# Patient Record
Sex: Female | Born: 1945 | Race: White | Hispanic: No | Smoking: Never smoker
Health system: Southern US, Community
[De-identification: ages and names within clinical notes are randomized; demographics above are authoritative.]

## PROBLEM LIST (undated history)

## (undated) DIAGNOSIS — E782 Mixed hyperlipidemia: Secondary | ICD-10-CM

## (undated) DIAGNOSIS — K55039 Acute (reversible) ischemia of large intestine, extent unspecified: Secondary | ICD-10-CM

## (undated) DIAGNOSIS — R1010 Upper abdominal pain, unspecified: Secondary | ICD-10-CM

## (undated) DIAGNOSIS — E039 Hypothyroidism, unspecified: Secondary | ICD-10-CM

## (undated) DIAGNOSIS — K922 Gastrointestinal hemorrhage, unspecified: Secondary | ICD-10-CM

## (undated) DIAGNOSIS — I2510 Atherosclerotic heart disease of native coronary artery without angina pectoris: Secondary | ICD-10-CM

## (undated) DIAGNOSIS — I10 Essential (primary) hypertension: Secondary | ICD-10-CM

## (undated) DIAGNOSIS — F329 Major depressive disorder, single episode, unspecified: Secondary | ICD-10-CM

## (undated) DIAGNOSIS — F32A Depression, unspecified: Secondary | ICD-10-CM

## (undated) DIAGNOSIS — K219 Gastro-esophageal reflux disease without esophagitis: Secondary | ICD-10-CM

## (undated) DIAGNOSIS — F0390 Unspecified dementia without behavioral disturbance: Secondary | ICD-10-CM

## (undated) DIAGNOSIS — F419 Anxiety disorder, unspecified: Secondary | ICD-10-CM

## (undated) DIAGNOSIS — E785 Hyperlipidemia, unspecified: Secondary | ICD-10-CM

## (undated) HISTORY — DX: Essential (primary) hypertension: I10

## (undated) HISTORY — DX: Depression, unspecified: F32.A

## (undated) HISTORY — DX: Upper abdominal pain, unspecified: R10.10

## (undated) HISTORY — DX: Mixed hyperlipidemia: E78.2

## (undated) HISTORY — DX: Hypothyroidism, unspecified: E03.9

## (undated) HISTORY — DX: Gastro-esophageal reflux disease without esophagitis: K21.9

## (undated) HISTORY — DX: Hyperlipidemia, unspecified: E78.5

## (undated) HISTORY — DX: Acute (reversible) ischemia of large intestine, extent unspecified: K55.039

## (undated) HISTORY — DX: Anxiety disorder, unspecified: F41.9

## (undated) HISTORY — DX: Major depressive disorder, single episode, unspecified: F32.9

## (undated) HISTORY — DX: Gastrointestinal hemorrhage, unspecified: K92.2

## (undated) HISTORY — DX: Unspecified dementia, unspecified severity, without behavioral disturbance, psychotic disturbance, mood disturbance, and anxiety: F03.90

## (undated) HISTORY — DX: Atherosclerotic heart disease of native coronary artery without angina pectoris: I25.10

---

## 1964-08-20 HISTORY — PX: INNER EAR SURGERY: SHX679

## 1968-08-20 HISTORY — PX: TUBAL LIGATION: SHX77

## 1995-08-21 HISTORY — PX: BREAST BIOPSY: SHX20

## 1998-05-25 ENCOUNTER — Encounter: Payer: Self-pay | Admitting: Emergency Medicine

## 1998-05-25 ENCOUNTER — Inpatient Hospital Stay (HOSPITAL_COMMUNITY): Admission: EM | Admit: 1998-05-25 | Discharge: 1998-05-26 | Payer: Self-pay | Admitting: Emergency Medicine

## 1998-07-07 ENCOUNTER — Other Ambulatory Visit: Admission: RE | Admit: 1998-07-07 | Discharge: 1998-07-07 | Payer: Self-pay | Admitting: Obstetrics and Gynecology

## 2000-04-26 ENCOUNTER — Other Ambulatory Visit: Admission: RE | Admit: 2000-04-26 | Discharge: 2000-04-26 | Payer: Self-pay | Admitting: Obstetrics and Gynecology

## 2001-08-20 HISTORY — PX: COLONOSCOPY: SHX174

## 2002-01-26 ENCOUNTER — Ambulatory Visit (HOSPITAL_COMMUNITY): Admission: RE | Admit: 2002-01-26 | Discharge: 2002-01-26 | Payer: Self-pay | Admitting: Gastroenterology

## 2002-09-24 ENCOUNTER — Ambulatory Visit (HOSPITAL_COMMUNITY): Admission: RE | Admit: 2002-09-24 | Discharge: 2002-09-24 | Payer: Self-pay | Admitting: Family Medicine

## 2002-09-24 ENCOUNTER — Encounter: Payer: Self-pay | Admitting: Family Medicine

## 2003-10-19 ENCOUNTER — Ambulatory Visit (HOSPITAL_COMMUNITY): Admission: RE | Admit: 2003-10-19 | Discharge: 2003-10-19 | Payer: Self-pay | Admitting: Family Medicine

## 2004-02-02 ENCOUNTER — Emergency Department (HOSPITAL_COMMUNITY): Admission: EM | Admit: 2004-02-02 | Discharge: 2004-02-02 | Payer: Self-pay | Admitting: Emergency Medicine

## 2004-02-02 ENCOUNTER — Inpatient Hospital Stay (HOSPITAL_COMMUNITY): Admission: EM | Admit: 2004-02-02 | Discharge: 2004-02-05 | Payer: Self-pay | Admitting: Emergency Medicine

## 2004-02-18 ENCOUNTER — Inpatient Hospital Stay (HOSPITAL_COMMUNITY): Admission: AD | Admit: 2004-02-18 | Discharge: 2004-02-19 | Payer: Self-pay | Admitting: Internal Medicine

## 2004-03-03 ENCOUNTER — Ambulatory Visit (HOSPITAL_COMMUNITY): Admission: RE | Admit: 2004-03-03 | Discharge: 2004-03-03 | Payer: Self-pay | Admitting: Neurosurgery

## 2007-03-03 ENCOUNTER — Encounter: Payer: Self-pay | Admitting: Cardiology

## 2007-03-03 ENCOUNTER — Ambulatory Visit (HOSPITAL_COMMUNITY): Admission: RE | Admit: 2007-03-03 | Discharge: 2007-03-03 | Payer: Self-pay | Admitting: *Deleted

## 2007-05-29 ENCOUNTER — Ambulatory Visit (HOSPITAL_COMMUNITY): Admission: RE | Admit: 2007-05-29 | Discharge: 2007-05-29 | Payer: Self-pay | Admitting: Family Medicine

## 2008-06-15 ENCOUNTER — Ambulatory Visit (HOSPITAL_COMMUNITY): Admission: RE | Admit: 2008-06-15 | Discharge: 2008-06-15 | Payer: Self-pay | Admitting: Obstetrics and Gynecology

## 2008-06-28 ENCOUNTER — Other Ambulatory Visit: Admission: RE | Admit: 2008-06-28 | Discharge: 2008-06-28 | Payer: Self-pay | Admitting: Obstetrics & Gynecology

## 2008-09-06 ENCOUNTER — Inpatient Hospital Stay (HOSPITAL_COMMUNITY): Admission: EM | Admit: 2008-09-06 | Discharge: 2008-09-08 | Payer: Self-pay | Admitting: Internal Medicine

## 2008-09-06 ENCOUNTER — Encounter: Payer: Self-pay | Admitting: Emergency Medicine

## 2008-09-14 ENCOUNTER — Ambulatory Visit: Payer: Self-pay | Admitting: Cardiology

## 2008-11-10 ENCOUNTER — Encounter (INDEPENDENT_AMBULATORY_CARE_PROVIDER_SITE_OTHER): Payer: Self-pay | Admitting: *Deleted

## 2008-11-10 LAB — CONVERTED CEMR LAB
ALT: 29 units/L
AST: 12 units/L
Albumin: 4.5 g/dL
Alkaline Phosphatase: 94 units/L
BUN: 10 mg/dL
CO2: 27 meq/L
Calcium: 9.9 mg/dL
Chloride: 105 meq/L
Cholesterol: 176 mg/dL
Creatinine, Ser: 0.79 mg/dL
Glucose, Bld: 118 mg/dL
HDL: 48 mg/dL
LDL Cholesterol: 90 mg/dL
Potassium: 4.9 meq/L
Sodium: 145 meq/L
Total Protein: 7.6 g/dL
Triglycerides: 189 mg/dL

## 2008-11-30 ENCOUNTER — Ambulatory Visit: Payer: Self-pay | Admitting: Cardiology

## 2009-05-20 HISTORY — PX: TOTAL ABDOMINAL HYSTERECTOMY W/ BILATERAL SALPINGOOPHORECTOMY: SHX83

## 2009-06-17 ENCOUNTER — Ambulatory Visit (HOSPITAL_COMMUNITY): Admission: RE | Admit: 2009-06-17 | Discharge: 2009-06-17 | Payer: Self-pay | Admitting: Obstetrics and Gynecology

## 2009-06-20 ENCOUNTER — Ambulatory Visit (HOSPITAL_COMMUNITY): Admission: RE | Admit: 2009-06-20 | Discharge: 2009-06-20 | Payer: Self-pay | Admitting: Obstetrics and Gynecology

## 2009-06-21 ENCOUNTER — Inpatient Hospital Stay (HOSPITAL_COMMUNITY): Admission: RE | Admit: 2009-06-21 | Discharge: 2009-06-23 | Payer: Self-pay | Admitting: Obstetrics and Gynecology

## 2009-06-21 ENCOUNTER — Encounter: Payer: Self-pay | Admitting: Obstetrics and Gynecology

## 2009-06-28 ENCOUNTER — Encounter (INDEPENDENT_AMBULATORY_CARE_PROVIDER_SITE_OTHER): Payer: Self-pay | Admitting: *Deleted

## 2009-06-28 ENCOUNTER — Ambulatory Visit: Payer: Self-pay | Admitting: Cardiology

## 2009-06-28 DIAGNOSIS — K219 Gastro-esophageal reflux disease without esophagitis: Secondary | ICD-10-CM | POA: Insufficient documentation

## 2009-06-28 DIAGNOSIS — I2510 Atherosclerotic heart disease of native coronary artery without angina pectoris: Secondary | ICD-10-CM | POA: Insufficient documentation

## 2009-07-04 ENCOUNTER — Encounter (INDEPENDENT_AMBULATORY_CARE_PROVIDER_SITE_OTHER): Payer: Self-pay | Admitting: *Deleted

## 2009-07-04 ENCOUNTER — Encounter: Payer: Self-pay | Admitting: Cardiology

## 2009-07-04 LAB — CONVERTED CEMR LAB
ALT: 38 units/L
ALT: 38 units/L — ABNORMAL HIGH (ref 0–35)
AST: 19 units/L
AST: 19 units/L (ref 0–37)
Albumin: 4.5 g/dL
Albumin: 4.5 g/dL (ref 3.5–5.2)
Alkaline Phosphatase: 86 units/L
Alkaline Phosphatase: 86 units/L (ref 39–117)
BUN: 8 mg/dL
BUN: 8 mg/dL (ref 6–23)
CO2: 25 meq/L
CO2: 25 meq/L (ref 19–32)
Calcium: 9.9 mg/dL
Calcium: 9.9 mg/dL (ref 8.4–10.5)
Chloride: 103 meq/L
Chloride: 103 meq/L (ref 96–112)
Cholesterol: 159 mg/dL
Cholesterol: 159 mg/dL (ref 0–200)
Creatinine, Ser: 0.87 mg/dL
Creatinine, Ser: 0.87 mg/dL (ref 0.40–1.20)
Glucose, Bld: 117 mg/dL
Glucose, Bld: 117 mg/dL — ABNORMAL HIGH (ref 70–99)
HDL: 46 mg/dL
HDL: 46 mg/dL (ref >39)
LDL Cholesterol: 89 mg/dL
LDL Cholesterol: 89 mg/dL (ref 0–99)
Potassium: 5.1 meq/L
Potassium: 5.1 meq/L (ref 3.5–5.3)
Sodium: 142 meq/L
Sodium: 142 meq/L (ref 135–145)
Total Bilirubin: 0.5 mg/dL (ref 0.3–1.2)
Total CHOL/HDL Ratio: 3.5
Total Protein: 7.4 g/dL
Total Protein: 7.4 g/dL (ref 6.0–8.3)
Triglycerides: 119 mg/dL
Triglycerides: 119 mg/dL (ref <150)
VLDL: 24 mg/dL (ref 0–40)

## 2009-09-26 ENCOUNTER — Telehealth: Payer: Self-pay | Admitting: Cardiology

## 2009-10-26 ENCOUNTER — Telehealth (INDEPENDENT_AMBULATORY_CARE_PROVIDER_SITE_OTHER): Payer: Self-pay

## 2009-10-26 ENCOUNTER — Ambulatory Visit: Payer: Self-pay | Admitting: Cardiology

## 2010-03-14 ENCOUNTER — Encounter (INDEPENDENT_AMBULATORY_CARE_PROVIDER_SITE_OTHER): Payer: Self-pay | Admitting: *Deleted

## 2010-03-15 ENCOUNTER — Ambulatory Visit: Payer: Self-pay | Admitting: Cardiology

## 2010-03-15 ENCOUNTER — Encounter (INDEPENDENT_AMBULATORY_CARE_PROVIDER_SITE_OTHER): Payer: Self-pay | Admitting: *Deleted

## 2010-04-17 ENCOUNTER — Encounter (INDEPENDENT_AMBULATORY_CARE_PROVIDER_SITE_OTHER): Payer: Self-pay | Admitting: *Deleted

## 2010-04-17 LAB — CONVERTED CEMR LAB
ALT: 16 units/L
AST: 9 units/L
Albumin: 4.2 g/dL
Alkaline Phosphatase: 67 units/L
BUN: 11 mg/dL
CO2: 29 meq/L
Calcium: 9.5 mg/dL
Chloride: 105 meq/L
Cholesterol: 214 mg/dL
Creatinine, Ser: 0.66 mg/dL
Glucose, Bld: 108 mg/dL
HDL: 50 mg/dL
LDL Cholesterol: 137 mg/dL
Potassium: 4.9 meq/L
Sodium: 141 meq/L
Total Protein: 6.9 g/dL
Triglycerides: 134 mg/dL

## 2010-04-18 ENCOUNTER — Encounter (INDEPENDENT_AMBULATORY_CARE_PROVIDER_SITE_OTHER): Payer: Self-pay | Admitting: *Deleted

## 2010-05-04 ENCOUNTER — Encounter (INDEPENDENT_AMBULATORY_CARE_PROVIDER_SITE_OTHER): Payer: Self-pay | Admitting: *Deleted

## 2010-05-04 LAB — CONVERTED CEMR LAB
ALT: 16 units/L (ref 0–35)
AST: 9 units/L (ref 0–37)
Albumin: 4.2 g/dL (ref 3.5–5.2)
Alkaline Phosphatase: 67 units/L (ref 39–117)
BUN: 11 mg/dL (ref 6–23)
CO2: 29 meq/L (ref 19–32)
Calcium: 9.5 mg/dL (ref 8.4–10.5)
Chloride: 105 meq/L (ref 96–112)
Cholesterol: 214 mg/dL — ABNORMAL HIGH (ref 0–200)
Creatinine, Ser: 0.66 mg/dL (ref 0.40–1.20)
Glucose, Bld: 108 mg/dL — ABNORMAL HIGH (ref 70–99)
HDL: 50 mg/dL (ref >39)
LDL Cholesterol: 137 mg/dL — ABNORMAL HIGH (ref 0–99)
Potassium: 4.9 meq/L (ref 3.5–5.3)
Sodium: 141 meq/L (ref 135–145)
Total Bilirubin: 0.4 mg/dL (ref 0.3–1.2)
Total CHOL/HDL Ratio: 4.3
Total Protein: 6.9 g/dL (ref 6.0–8.3)
Triglycerides: 134 mg/dL (ref <150)
VLDL: 27 mg/dL (ref 0–40)

## 2010-05-15 ENCOUNTER — Telehealth (INDEPENDENT_AMBULATORY_CARE_PROVIDER_SITE_OTHER): Payer: Self-pay | Admitting: *Deleted

## 2010-06-13 LAB — CONVERTED CEMR LAB
Cholesterol: 196 mg/dL (ref 0–200)
HDL: 57 mg/dL (ref >39)
LDL Cholesterol: 119 mg/dL — ABNORMAL HIGH (ref 0–99)
Total CHOL/HDL Ratio: 3.4
Triglycerides: 98 mg/dL (ref <150)
VLDL: 20 mg/dL (ref 0–40)

## 2010-06-23 ENCOUNTER — Telehealth (INDEPENDENT_AMBULATORY_CARE_PROVIDER_SITE_OTHER): Payer: Self-pay | Admitting: *Deleted

## 2010-06-26 ENCOUNTER — Encounter (INDEPENDENT_AMBULATORY_CARE_PROVIDER_SITE_OTHER): Payer: Self-pay | Admitting: *Deleted

## 2010-09-19 NOTE — Miscellaneous (Signed)
Summary: cmp,lipids 11/10/2008  Clinical Lists Changes  Observations: Added new observation of CALCIUM: 9.9 mg/dL (40/34/7425 95:63) Added new observation of ALBUMIN: 4.5 g/dL (87/56/4332 95:18) Added new observation of PROTEIN, TOT: 7.6 g/dL (84/16/6063 01:60) Added new observation of SGPT (ALT): 29 units/L (11/10/2008 10:30) Added new observation of SGOT (AST): 12 units/L (11/10/2008 10:30) Added new observation of ALK PHOS: 94 units/L (11/10/2008 10:30) Added new observation of BILI DIRECT: total bili   0.6 mg/dL (10/93/2355 73:22) Added new observation of CREATININE: 0.79 mg/dL (02/54/2706 23:76) Added new observation of BUN: 10 mg/dL (28/31/5176 16:07) Added new observation of BG RANDOM: 118 mg/dL (37/05/6268 48:54) Added new observation of CO2 PLSM/SER: 27 meq/L (11/10/2008 10:30) Added new observation of CL SERUM: 105 meq/L (11/10/2008 10:30) Added new observation of K SERUM: 4.9 meq/L (11/10/2008 10:30) Added new observation of NA: 145 meq/L (11/10/2008 10:30) Added new observation of LDL: 90 mg/dL (62/70/3500 93:81) Added new observation of HDL: 48 mg/dL (82/99/3716 96:78) Added new observation of TRIGLYC TOT: 189 mg/dL (93/81/0175 10:25) Added new observation of CHOLESTEROL: 176 mg/dL (85/27/7824 23:53)

## 2010-09-19 NOTE — Assessment & Plan Note (Signed)
Summary: 7 MTH F/UPER CHECKOUT ON 11/30/08/TG  Medications Added FISH OIL 1000 MG CAPS (OMEGA-3 FATTY ACIDS) take 1 tab daily ASPIR-LOW 81 MG TBEC (ASPIRIN) take 1 tab daily LIPITOR 40 MG TABS (ATORVASTATIN CALCIUM) take 1 tab daily TOPROL XL 50 MG XR24H-TAB (METOPROLOL SUCCINATE) take 1 tab daily DIOVAN HCT 320-25 MG TABS (VALSARTAN-HYDROCHLOROTHIAZIDE) take 1 tab daily PROTONIX 40 MG TBEC (PANTOPRAZOLE SODIUM) take 1 tab daily OXYCODONE-ACETAMINOPHEN 2.5-325 MG TABS (OXYCODONE-ACETAMINOPHEN) take as needed for pain STOOL SOFTENER 100 MG CAPS (DOCUSATE SODIUM) take as needed      Allergies Added: NKDA  Visit Type:  Follow-up Primary Provider:  Dr. [name]   History of Present Illness:   Return visit for this very pleasant 65 year old woman with minor coronary artery disease when last assessed in 2008, intermittent chest discomfort, dyslipidemia and hypertension.  Since her last visit, she has done generally well.  She reports rare episodes of chest discomfort that responded to sublingual nitroglycerin.  These are poorly characterized, fairly mild, located in the anterior chest diffusely and unassociated with other symptoms.  She has adequate exercise tolerance and does her housework without difficulty.  Clinical Review Panels:  Lab Work BUN 10 (11/10/2008) Creatinine 0.79 (11/10/2008)  Lipid Levels LDL 90 (11/10/2008) HDL 48 (11/10/2008) Cholesterol 176 (11/10/2008)    Current Medications (verified): 1)  Fish Oil 1000 Mg Caps (Omega-3 Fatty Acids) .... Take 1 Tab Daily 2)  Aspir-Low 81 Mg Tbec (Aspirin) .... Take 1 Tab Daily 3)  Lipitor 40 Mg Tabs (Atorvastatin Calcium) .... Take 1 Tab Daily 4)  Toprol Xl 50 Mg Xr24h-Tab (Metoprolol Succinate) .... Take 1 Tab Daily 5)  Diovan Hct 320-25 Mg Tabs (Valsartan-Hydrochlorothiazide) .... Take 1 Tab Daily 6)  Protonix 40 Mg Tbec (Pantoprazole Sodium) .... Take 1 Tab Daily 7)  Oxycodone-Acetaminophen 2.5-325 Mg Tabs  (Oxycodone-Acetaminophen) .... Take As Needed For Pain 8)  Stool Softener 100 Mg Caps (Docusate Sodium) .... Take As Needed  Allergies (verified): No Known Drug Allergies  Past History:  PMH, FH, and Social History reviewed and updated.  Past Surgical History: Right ear surgical procedure-1966 Excisional left breast biopsy-1997 Tubal ligation-1970 Total abdominal hysterectomy with bilateral salpingo-oophorectomy-05/2009  Review of Systems       The patient complains of chest pain.  The patient denies weight loss, syncope, dyspnea on exertion, and peripheral edema.    Vital Signs:  Patient profile:   65 year old female Height:      65 inches Weight:      163 pounds BMI:     27.22 Pulse rate:   85 / minute BP sitting:   137 / 84  (right arm)  Vitals Entered By: Dreama Saa, CNA (June 28, 2009 1:48 PM)  Physical Exam  General:   General-Well developed; no acute distress:mildly overweight Neck-No JVD; no carotid bruits: Lungs-No tachypnea, no rales; no rhonchi; no wheezes: Cardiovascular-normal PMI; normal S1 and S2: Abdomen-BS normal; soft and non-tender without masses or organomegaly: no bruits Musculoskeletal-No deformities, no cyanosis or clubbing: Neurologic-Normal cranial nerves; symmetric strength and tone:  Skin-Warm, no significant lesions: Extremities-Nl distal pulses; no edema:     Impression & Recommendations:  Problem # 1:  ATHEROSCLEROTIC CARDIOVASCULAR DISEASE (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  In any case, she had low risk disease only 2 years ago.  No further testing is required at the present time.  Should chest discomfort become more frequent or troublesome, I  would be inclined to add amlodipine to her medical regime.  Problem # 2:  HYPERTENSION, UNSPECIFIED (ICD-401.9) Blood pressure control is good with current medications, which will be continued.  Problem  # 3:  HYPERLIPIDEMIA-MIXED (ICD-272.4) Control of hyperlipidemia was almost optimal on 20 mg of atorvastatin when last assessed a few months ago.  We will repeat a lipid profile and a chemistry profile.  I will plan to see this nice woman again in 8 months.  She is cautioned to call for any increase in symptoms in the interim.  Other Orders: Future Orders: T-Lipid Profile ([id number]) ... 07/04/2009 T-Comprehensive Metabolic Panel [phone]) ... 07/04/2009  Patient Instructions: 1)  Your physician recommends that you schedule a follow-up appointment in: 8 MONTHS 2)  Your physician recommends that you return for lab work in: [employer]

## 2010-09-19 NOTE — Miscellaneous (Signed)
Summary: LABS CMP,LIPIDS,04/17/2010  Clinical Lists Changes  Observations: Added new observation of CALCIUM: 9.5 mg/dL (16/05/9603 5:40) Added new observation of ALBUMIN: 4.2 g/dL (98/06/9146 8:29) Added new observation of PROTEIN, TOT: 6.9 g/dL (56/21/3086 5:78) Added new observation of SGPT (ALT): 16 units/L (04/17/2010 9:27) Added new observation of SGOT (AST): 9 units/L (04/17/2010 9:27) Added new observation of ALK PHOS: 67 units/L (04/17/2010 9:27) Added new observation of CREATININE: 0.66 mg/dL (46/96/2952 8:41) Added new observation of BUN: 11 mg/dL (32/44/0102 7:25) Added new observation of BG RANDOM: 108 mg/dL (36/64/4034 7:42) Added new observation of CO2 PLSM/SER: 29 meq/L (04/17/2010 9:27) Added new observation of CL SERUM: 105 meq/L (04/17/2010 9:27) Added new observation of K SERUM: 4.9 meq/L (04/17/2010 9:27) Added new observation of NA: 141 meq/L (04/17/2010 9:27) Added new observation of LDL: 137 mg/dL (59/56/3875 6:43) Added new observation of HDL: 50 mg/dL (32/95/1884 1:66) Added new observation of TRIGLYC TOT: 134 mg/dL (02/17/1600 0:93) Added new observation of CHOLESTEROL: 214 mg/dL (23/55/7322 0:25)

## 2010-09-19 NOTE — Progress Notes (Signed)
Summary: Medication   Phone Note Call from Patient   Caller: Patient Summary of Call: pt would return phone call regarding medication that was called in this AM/tg Initial call taken by: Raechel Ache [facility],  October 26, 2009 1:51 PM  Follow-up for Phone Call        Patient wanted me to know that she uses [facility] in [place] as her pharmacy and not [facility] any longer. Follow-up by: Larita Fife Via LPN,  October 26, 2009 3:36 PM

## 2010-09-19 NOTE — Letter (Signed)
Summary: BP READINGS  BP READINGS   Imported By: Faythe Ghee 10/26/2009 13:41:57  _____________________________________________________________________  External Attachment:    Type:   Image     Comment:   External Document

## 2010-09-19 NOTE — Assessment & Plan Note (Signed)
Summary: 8 mth f/u per checkout on 06/28/09/tg  Medications Added RANITIDINE HCL 150 MG CAPS (RANITIDINE HCL) take as needed PRAVASTATIN SODIUM 20 MG TABS (PRAVASTATIN SODIUM) Take 1 tablet by mouth once a day      Allergies Added: NKDA  Visit Type:  Follow-up Primary Provider:  Sabino Snipes   History of Present Illness: [name] returns to the office as scheduled for continued assessment and treatment and of mild coronary artery disease and control of cardiovascular risk factors.  Since her last visit, she has done beautifully.  In fact, she has had no medical attention whatsoever since I last saw her.  She denies chest discomfort, dyspnea, orthopnea, PND, lightheadedness, syncope and peripheral edema.  She has problems with left leg pain, which could represent arthritis at the hip or degenerative disease in the lumbosacral spine.  This was exacerbated by simvastatin causing her to discontinue that medication.  Unfortunately, she did not inform us of this problem.  Current Medications (verified): 1)  Fish Oil 1000 Mg Caps (Omega-3 Fatty Acids) .... Take 1 Tab Daily 2)  Aspir-Low 81 Mg Tbec (Aspirin) .... Take 1 Tab Daily 3)  Toprol Xl 50 Mg Xr24h-Tab (Metoprolol Succinate) .... Take 1 Tab Daily 4)  Stool Softener 100 Mg Caps (Docusate Sodium) .... Take As Needed 5)  Lisinopril-Hydrochlorothiazide 20-12.5 Mg Tabs (Lisinopril-Hydrochlorothiazide) .... Take 1 Tablet By Mouth Once Daily 6)  Ranitidine Hcl 150 Mg Caps (Ranitidine Hcl) .... Take As Needed 7)  Pravastatin Sodium 20 Mg Tabs (Pravastatin Sodium) .... Take 1 Tablet By Mouth Once A Day  Allergies (verified): No Known Drug Allergies  Past History:  PMH, FH, and Social History reviewed and updated.  Review of Systems       See history of present illness.  Vital Signs:  Patient profile:   65 year old female Weight:      160 pounds Pulse rate:   63 / minute BP sitting:   137 / 75  (right arm)  Vitals Entered By:  Dreama Saa, CNA (March 15, 2010 2:58 PM)  Physical Exam  General:  Minimally overweight; well developed; no acute distress Neck-No JVD; no carotid bruits: Lungs-No tachypnea, no rales; no rhonchi; no wheezes: Cardiovascular-normal PMI; normal S1 and S2; S4 present; minimal systolic murmur Abdomen-BS normal; soft and non-tender without masses or organomegaly: no bruits Musculoskeletal-No deformities, no cyanosis or clubbing: Neurologic-Normal cranial nerves; symmetric strength and tone:  Skin-Warm, no significant lesions: Extremities-Nl distal pulses; no edema:     Impression & Recommendations:  Problem # 1:  ATHEROSCLEROTIC CARDIOVASCULAR DISEASE (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  With nonobstructive disease at catheterization in 2008, we will continue to focus on management of cardiovascular risk factors.  Problem # 2:  HYPERTENSION (ICD-401.9) Patient provides [place] with blood pressure measurements during the month of March, which were superb.  Systolic rarely exceeded 125, and diastolics were consistently below 85.  Current therapy will be continued.  Problem # 3:  HYPERLIPIDEMIA (ICD-272.4) Pravastatin will be started at low dose.  This is generally one of the better tolerated statins.  If she experiences adverse effects, we will attempt to obtain a supply of atorvastatin for her from the manufacturer.  A lipid profile and chemistry profile will be obtained in one month.  I will plan to see this nice woman again in one year.  Other Orders: Future Orders: T-Lipid Profile ([id number]) ... 04/17/2010 T-Comprehensive Metabolic Panel [id number]) ... 04/17/2010  Patient Instructions: 1)  Your physician recommends that you schedule a follow-up appointment in: 1 year 2)  Your physician recommends that you return for lab work in: 1 month 3)  Your physician has recommended you make the following change in your medication: pravastatin 20mg   daily Prescriptions: PRAVASTATIN SODIUM 20 MG TABS (PRAVASTATIN SODIUM) Take 1 tablet by mouth once a day  #30 x 3   Entered by:   Teressa Lower RN   Authorized by:   Kathlen Brunswick, MD, [facility]   Signed by:   Teressa Lower RN on 03/15/2010   Method used:   Electronically to        [facility]  [address] (retail)       [address]       [place], [place]  [address]       Ph: [phone]       Fax: [phone]   RxID:   [id number]

## 2010-09-19 NOTE — Letter (Signed)
Summary: [place] [facility] Work Engineer, agricultural at [employer]  [address] [place] [address]   Phone: [phone]  Fax: [phone]     May 04, 2010 MRN: [id number]   [facility] [place] [address] [place], [place]  [address]      YOUR LAB WORK IS DUE   OCTOBER [phone]  Please go to [facility], located across the street from [facility] on the second floor.  Hours are Monday - Friday 7am until 7:30pm         Saturday 8am until 12noon    _X_  DO NOT EAT OR DRINK AFTER MIDNIGHT EVENING PRIOR TO LABWORK  __ YOUR LABWORK IS NOT FASTING --YOU MAY EAT PRIOR TO LABWORK

## 2010-09-19 NOTE — Progress Notes (Signed)
Summary: Medication Question  Medications Added SIMVASTATIN 40 MG TABS (SIMVASTATIN) Take 1 tablet by mouth at bedtime LISINOPRIL-HYDROCHLOROTHIAZIDE 20-12.5 MG TABS (LISINOPRIL-HYDROCHLOROTHIAZIDE) take 1 tablet by mouth once daily       Phone Note Call from Patient   Caller: Patient Reason for Call: Refill Medication Summary of Call: pt taking Diovan HCT 320/25 and Lipitor/wants to know if there is anything cheaper she can have due to insurance change/tg Initial call taken by: Raechel Ache [facility],  September 26, 2009 9:56 AM  Follow-up for Phone Call        pt's lipitor was name brand will change to simvastatin but she needs something in place of the diovan, no longer has ins. with copay of $10.00 for meds Follow-up by: Teressa Lower RN,  September 26, 2009 12:08 PM  Additional Follow-up for Phone Call Additional follow up Details #1::        Lisinopril/HCT 20/12.5 mg q.d. Home blood pressures Blood pressure check in one month  Oilton Bing, M.D.  Additional Follow-up by: Kathlen Brunswick, MD, [facility],  September 27, 2009 9:28 AM    Additional Follow-up for Phone Call Additional follow up Details #2::    Patient advised, she gave verbal understanding. I transferred call to front desk to set up appt. for BP check.  New/Updated Medications: SIMVASTATIN 40 MG TABS (SIMVASTATIN) Take 1 tablet by mouth at bedtime LISINOPRIL-HYDROCHLOROTHIAZIDE 20-12.5 MG TABS (LISINOPRIL-HYDROCHLOROTHIAZIDE) take 1 tablet by mouth once daily Prescriptions: LISINOPRIL-HYDROCHLOROTHIAZIDE 20-12.5 MG TABS (LISINOPRIL-HYDROCHLOROTHIAZIDE) take 1 tablet by mouth once daily  #30 x 6   Entered by:   Larita Fife Via LPN   Authorized by:   Kathlen Brunswick, MD, [facility]   Signed by:   Larita Fife Via LPN on 58/52/7782   Method used:   Electronically to        [facility]  [address] (retail)       [address]       [place], [place]  [address]       Ph: [phone]       Fax: [phone]   RxID:    [id number] SIMVASTATIN 40 MG TABS (SIMVASTATIN) Take 1 tablet by mouth at bedtime  #30 x 6   Entered by:   Larita Fife Via LPN   Authorized by:   Kathlen Brunswick, MD, [facility]   Signed by:   Larita Fife Via LPN on 50/53/9767   Method used:   Electronically to        [facility]  [address] (retail)       [address]       [place], [place]  [address]       Ph: [phone]       Fax: [phone]   RxID:   [id number]

## 2010-09-19 NOTE — Progress Notes (Signed)
Summary: Medication   Phone Note Call from Patient   Caller: Daughter [name]) Reason for Call: Talk to Nurse Summary of Call: pt states that pt has medicare so she cannot use $4 card for Lipitor / pls call with instructions /tg Initial call taken by: Raechel Ache [facility],  June 23, 2010 1:25 PM  Follow-up for Phone Call        pt to continue pravastatin 80mg  daily,  sent copy of labs to pt in the mail Follow-up by: Teressa Lower RN,  June 26, 2010 1:01 PM

## 2010-09-19 NOTE — Miscellaneous (Signed)
Summary: LABS CMP,LIPIDS,07/04/2009  Clinical Lists Changes  Observations: Added new observation of CALCIUM: 9.9 mg/dL (44/10/4740 59:56) Added new observation of ALBUMIN: 4.5 g/dL (38/75/6433 29:51) Added new observation of PROTEIN, TOT: 7.4 g/dL (88/41/6606 30:16) Added new observation of SGPT (ALT): 38 units/L (07/04/2009 17:15) Added new observation of SGOT (AST): 19 units/L (07/04/2009 17:15) Added new observation of ALK PHOS: 86 units/L (07/04/2009 17:15) Added new observation of CREATININE: 0.87 mg/dL (08/28/3233 57:32) Added new observation of BUN: 8 mg/dL (20/25/4270 62:37) Added new observation of BG RANDOM: 117 mg/dL (62/83/1517 61:60) Added new observation of CO2 PLSM/SER: 25 meq/L (07/04/2009 17:15) Added new observation of CL SERUM: 103 meq/L (07/04/2009 17:15) Added new observation of K SERUM: 5.1 meq/L (07/04/2009 17:15) Added new observation of NA: 142 meq/L (07/04/2009 17:15) Added new observation of LDL: 89 mg/dL (73/71/0626 94:85) Added new observation of HDL: 46 mg/dL (46/27/0350 09:38) Added new observation of TRIGLYC TOT: 119 mg/dL (18/29/9371 69:67) Added new observation of CHOLESTEROL: 159 mg/dL (89/38/1017 51:02)

## 2010-09-19 NOTE — Letter (Signed)
Summary: [facility] Work Engineer, agricultural at [employer]  [address] [place] [address]   Phone: [phone]  Fax: [phone]     March 15, 2010 MRN: [id number]   Lori Bell [address] [place], [place]  [address]      YOUR LAB WORK IS DUE   April 17, 2010  Please go to [facility], located across the street from [facility] on the second floor.  Hours are Monday - Friday 7am until 7:30pm         Saturday 8am until 12noon    _X_  DO NOT EAT OR DRINK AFTER MIDNIGHT EVENING PRIOR TO LABWORK  __ YOUR LABWORK IS NOT FASTING --YOU MAY EAT PRIOR TO LABWORK

## 2010-09-19 NOTE — Letter (Signed)
Summary: [name] Work Engineer, agricultural at [employer]  [address], [place] [address]   Phone: [phone]  Fax: [phone]     June 28, 2009 MRN: [id number]   Lori Bell [address] [place], [place]  [address]      YOUR LAB WORK IS DUE  NOVEMBER 15,2010 _________________________________________  Please go to [facility], located across the street from [facility] on the second floor.  Hours are Monday - Friday 7am until 7:30pm         Saturday 8am until 12noon    _X_  DO NOT EAT OR DRINK AFTER MIDNIGHT EVENING PRIOR TO LABWORK  __ YOUR LABWORK IS NOT FASTING --YOU MAY EAT PRIOR TO LABWORK

## 2010-09-19 NOTE — Letter (Signed)
Summary: [place] Results Engineer, agricultural at [facility]  [address] [place] [address]   Phone: [phone]  Fax: [phone]      June 26, 2010 MRN: [id number]   [facility] [address] [place], [place]  [address]   Dear Ms. Witucki,  Your test ordered by Selena Batten has been reviewed by your physician (or physician assistant) and was found to be normal or stable. Your physician (or physician assistant) felt no changes were needed at this time.  ____ Echocardiogram  ____ Cardiac Stress Test  __x__ Lab Work  ____ Peripheral vascular study of arms, legs or neck  ____ CT scan or X-ray  ____ Lung or Breathing test  ____ Other: No change in medical treatment at this time, per Dr. Dietrich Pates.  Enclosed is a copy of your labwork for your records.  Thank you, Antonique Langford Allyne Gee RN    [place] Bing, MD, [facility] Arena.C.Gaylord Shih, MD, F.A.C.C Lewayne Bunting, MD, F.A.C.C Nona Dell, MD, F.A.C.C Charlton Haws, MD, [facility].C.C

## 2010-09-19 NOTE — Progress Notes (Signed)
   Phone Note Call from Patient   Caller: [name] Reason for Call: Talk to Nurse Summary of Call: Lori Bell wants to talk about her med. "pradastation" and the MD order to increase from 20mg  to 80mg .  She can be reached at [phone] Initial call taken by: Alexis Goodell    Prescriptions: PRAVASTATIN SODIUM 80 MG TABS (PRAVASTATIN SODIUM) Take one tablet by mouth daily at bedtime  #30 x 6   Entered by:   Teressa Lower RN   Authorized by:   Kathlen Brunswick, MD, [facility]   Signed by:   Teressa Lower RN on 05/15/2010   Method used:   Electronically to        [facility]  [address] (retail)       [address]       [place], [place]  [address]       Ph: [phone]       Fax: [phone]   RxID:   [id number]

## 2010-09-19 NOTE — Assessment & Plan Note (Signed)
Summary: 1 mth bp check per Lynn/tg  Nurse Visit   Vital Signs:  Patient profile:   65 year old female Weight:      166 pounds O2 Sat:      96 % Pulse rate:   68 / minute BP sitting:   140 / 81  (left arm)  Vitals Entered ByLarita Fife Via LPN (October 26, 1608 10:01 AM)  Primary Provider:  Dr. [name]   History of Present Illness: Patient arrives in office for BP check.  On 09-26-09 Diovan was replaced with Lisinopril/HCTZ 20/12.5mg  by mouth once daily due to cost. She states that other than feeling tired with occasional SOB yesterday, she has no complaints at this time. BP= 140/81, P= 68 and O2=96%. None of pt's home BP's are greater than todays BP. BP diary scanned into chart.   Current Medications (verified): 1)  Fish Oil 1000 Mg Caps (Omega-3 Fatty Acids) .... Take 1 Tab Daily 2)  Aspir-Low 81 Mg Tbec (Aspirin) .... Take 1 Tab Daily 3)  Toprol Xl 50 Mg Xr24h-Tab (Metoprolol Succinate) .... Take 1 Tab Daily 4)  Protonix 40 Mg Tbec (Pantoprazole Sodium) .... Take 1 Tab Daily 5)  Stool Softener 100 Mg Caps (Docusate Sodium) .... Take As Needed 6)  Simvastatin 40 Mg Tabs (Simvastatin) .... Take 1 Tablet By Mouth At Bedtime 7)  Lisinopril-Hydrochlorothiazide 20-12.5 Mg Tabs (Lisinopril-Hydrochlorothiazide) .... Take 1 Tablet By Mouth Once Daily  Allergies (verified): No Known Drug Allergies

## 2010-11-22 LAB — DIFFERENTIAL
Basophils Absolute: 0 10*3/uL (ref 0.0–0.1)
Basophils Absolute: 0 10*3/uL (ref 0.0–0.1)
Basophils Relative: 0 % (ref 0–1)
Basophils Relative: 0 % (ref 0–1)
Eosinophils Absolute: 0 10*3/uL (ref 0.0–0.7)
Eosinophils Absolute: 0 10*3/uL (ref 0.0–0.7)
Eosinophils Relative: 0 % (ref 0–5)
Eosinophils Relative: 0 % (ref 0–5)
Lymphocytes Relative: 15 % (ref 12–46)
Lymphocytes Relative: 16 % (ref 12–46)
Lymphs Abs: 1 10*3/uL (ref 0.7–4.0)
Lymphs Abs: 1.1 10*3/uL (ref 0.7–4.0)
Monocytes Absolute: 0.3 10*3/uL (ref 0.1–1.0)
Monocytes Absolute: 0.4 10*3/uL (ref 0.1–1.0)
Monocytes Relative: 5 % (ref 3–12)
Monocytes Relative: 6 % (ref 3–12)
Neutro Abs: 5 10*3/uL (ref 1.7–7.7)
Neutro Abs: 6 10*3/uL (ref 1.7–7.7)
Neutrophils Relative %: 79 % — ABNORMAL HIGH (ref 43–77)
Neutrophils Relative %: 81 % — ABNORMAL HIGH (ref 43–77)

## 2010-11-22 LAB — CBC
HCT: 30.4 % — ABNORMAL LOW (ref 36.0–46.0)
HCT: 31.4 % — ABNORMAL LOW (ref 36.0–46.0)
Hemoglobin: 10.5 g/dL — ABNORMAL LOW (ref 12.0–15.0)
Hemoglobin: 11.1 g/dL — ABNORMAL LOW (ref 12.0–15.0)
MCHC: 34.5 g/dL (ref 30.0–36.0)
MCHC: 35.4 g/dL (ref 30.0–36.0)
MCV: 89.9 fL (ref 78.0–100.0)
MCV: 91.1 fL (ref 78.0–100.0)
Platelets: 127 10*3/uL — ABNORMAL LOW (ref 150–400)
Platelets: 154 10*3/uL (ref 150–400)
RBC: 3.34 MIL/uL — ABNORMAL LOW (ref 3.87–5.11)
RBC: 3.49 MIL/uL — ABNORMAL LOW (ref 3.87–5.11)
RDW: 12.7 % (ref 11.5–15.5)
RDW: 12.9 % (ref 11.5–15.5)
WBC: 6.4 10*3/uL (ref 4.0–10.5)
WBC: 7.4 10*3/uL (ref 4.0–10.5)

## 2010-11-22 LAB — BASIC METABOLIC PANEL
BUN: 10 mg/dL (ref 6–23)
CO2: 28 mEq/L (ref 19–32)
Calcium: 8.8 mg/dL (ref 8.4–10.5)
Chloride: 105 mEq/L (ref 96–112)
Creatinine, Ser: 0.77 mg/dL (ref 0.4–1.2)
GFR calc Af Amer: >60 mL/min (ref >60)
GFR calc non Af Amer: >60 mL/min (ref >60)
Glucose, Bld: 119 mg/dL — ABNORMAL HIGH (ref 70–99)
Potassium: 3.8 mEq/L (ref 3.5–5.1)
Sodium: 139 mEq/L (ref 135–145)

## 2010-11-22 LAB — GLUCOSE, CAPILLARY: Glucose-Capillary: 96 mg/dL (ref 70–99)

## 2010-11-23 LAB — COMPREHENSIVE METABOLIC PANEL
ALT: 30 U/L (ref 0–35)
AST: 18 U/L (ref 0–37)
Albumin: 4.4 g/dL (ref 3.5–5.2)
Alkaline Phosphatase: 83 U/L (ref 39–117)
BUN: 8 mg/dL (ref 6–23)
CO2: 30 mEq/L (ref 19–32)
Calcium: 9.9 mg/dL (ref 8.4–10.5)
Chloride: 100 mEq/L (ref 96–112)
Creatinine, Ser: 0.68 mg/dL (ref 0.4–1.2)
GFR calc Af Amer: >60 mL/min (ref >60)
GFR calc non Af Amer: >60 mL/min (ref >60)
Glucose, Bld: 111 mg/dL — ABNORMAL HIGH (ref 70–99)
Potassium: 3.9 mEq/L (ref 3.5–5.1)
Sodium: 139 mEq/L (ref 135–145)
Total Bilirubin: 0.7 mg/dL (ref 0.3–1.2)
Total Protein: 7.2 g/dL (ref 6.0–8.3)

## 2010-11-23 LAB — CBC
HCT: 38.4 % (ref 36.0–46.0)
Hemoglobin: 13.4 g/dL (ref 12.0–15.0)
MCHC: 34.9 g/dL (ref 30.0–36.0)
MCV: 89.5 fL (ref 78.0–100.0)
Platelets: 188 10*3/uL (ref 150–400)
RBC: 4.29 MIL/uL (ref 3.87–5.11)
RDW: 12.7 % (ref 11.5–15.5)
WBC: 4.9 10*3/uL (ref 4.0–10.5)

## 2010-11-23 LAB — TYPE AND SCREEN
ABO/RH(D): A POS
Antibody Screen: NEGATIVE

## 2010-12-04 LAB — BASIC METABOLIC PANEL
BUN: 11 mg/dL (ref 6–23)
BUN: 7 mg/dL (ref 6–23)
BUN: 8 mg/dL (ref 6–23)
CO2: 28 mEq/L (ref 19–32)
CO2: 25 mEq/L (ref 19–32)
CO2: 26 mEq/L (ref 19–32)
Calcium: 9 mg/dL (ref 8.4–10.5)
Calcium: 8.7 mg/dL (ref 8.4–10.5)
Calcium: 9.3 mg/dL (ref 8.4–10.5)
Chloride: 100 mEq/L (ref 96–112)
Chloride: 108 mEq/L (ref 96–112)
Chloride: 110 mEq/L (ref 96–112)
Creatinine, Ser: 0.8 mg/dL (ref 0.4–1.2)
Creatinine, Ser: 0.42 mg/dL (ref 0.4–1.2)
Creatinine, Ser: 0.69 mg/dL (ref 0.4–1.2)
GFR calc Af Amer: >60 mL/min (ref >60)
GFR calc Af Amer: >60 mL/min (ref >60)
GFR calc Af Amer: >60 mL/min (ref >60)
GFR calc non Af Amer: >60 mL/min (ref >60)
GFR calc non Af Amer: >60 mL/min (ref >60)
GFR calc non Af Amer: >60 mL/min (ref >60)
Glucose, Bld: 122 mg/dL — ABNORMAL HIGH (ref 70–99)
Glucose, Bld: 106 mg/dL — ABNORMAL HIGH (ref 70–99)
Glucose, Bld: 110 mg/dL — ABNORMAL HIGH (ref 70–99)
Potassium: 3.2 mEq/L — ABNORMAL LOW (ref 3.5–5.1)
Potassium: 3.7 mEq/L (ref 3.5–5.1)
Potassium: 4.5 mEq/L (ref 3.5–5.1)
Sodium: 137 mEq/L (ref 135–145)
Sodium: 143 mEq/L (ref 135–145)
Sodium: 143 mEq/L (ref 135–145)

## 2010-12-04 LAB — CARDIAC PANEL(CRET KIN+CKTOT+MB+TROPI)
CK, MB: 3.1 ng/mL (ref 0.3–4.0)
CK, MB: 5.5 ng/mL — ABNORMAL HIGH (ref 0.3–4.0)
CK, MB: 8 ng/mL — ABNORMAL HIGH (ref 0.3–4.0)
CK, MB: 8.7 ng/mL — ABNORMAL HIGH (ref 0.3–4.0)
Relative Index: 1.5 (ref 0.0–2.5)
Relative Index: 2.4 (ref 0.0–2.5)
Relative Index: 3.5 — ABNORMAL HIGH (ref 0.0–2.5)
Relative Index: 3.9 — ABNORMAL HIGH (ref 0.0–2.5)
Total CK: 203 U/L — ABNORMAL HIGH (ref 7–177)
Total CK: 211 U/L — ABNORMAL HIGH (ref 7–177)
Total CK: 233 U/L — ABNORMAL HIGH (ref 7–177)
Total CK: 248 U/L — ABNORMAL HIGH (ref 7–177)
Troponin I: 0.03 ng/mL (ref 0.00–0.06)
Troponin I: <0.01 ng/mL (ref 0.00–0.06)
Troponin I: <0.01 ng/mL (ref 0.00–0.06)
Troponin I: <0.01 ng/mL (ref 0.00–0.06)

## 2010-12-04 LAB — LIPID PANEL
Cholesterol: 189 mg/dL (ref 0–200)
HDL: 40 mg/dL (ref >39)
LDL Cholesterol: 127 mg/dL — ABNORMAL HIGH (ref 0–99)
Total CHOL/HDL Ratio: 4.7 RATIO
Triglycerides: 108 mg/dL (ref <150)
VLDL: 22 mg/dL (ref 0–40)

## 2010-12-04 LAB — DIFFERENTIAL
Basophils Absolute: 0 10*3/uL (ref 0.0–0.1)
Basophils Relative: 1 % (ref 0–1)
Eosinophils Absolute: 0.1 10*3/uL (ref 0.0–0.7)
Eosinophils Relative: 2 % (ref 0–5)
Lymphocytes Relative: 35 % (ref 12–46)
Lymphs Abs: 2.1 10*3/uL (ref 0.7–4.0)
Monocytes Absolute: 0.3 10*3/uL (ref 0.1–1.0)
Monocytes Relative: 5 % (ref 3–12)
Neutro Abs: 3.3 10*3/uL (ref 1.7–7.7)
Neutrophils Relative %: 57 % (ref 43–77)

## 2010-12-04 LAB — CBC
HCT: 38.7 % (ref 36.0–46.0)
Hemoglobin: 13.1 g/dL (ref 12.0–15.0)
MCHC: 33.8 g/dL (ref 30.0–36.0)
MCV: 90.2 fL (ref 78.0–100.0)
Platelets: 202 10*3/uL (ref 150–400)
RBC: 4.29 MIL/uL (ref 3.87–5.11)
RDW: 12.7 % (ref 11.5–15.5)
WBC: 5.8 10*3/uL (ref 4.0–10.5)

## 2010-12-04 LAB — POCT CARDIAC MARKERS
CKMB, poc: <1 ng/mL — ABNORMAL LOW (ref 1.0–8.0)
CKMB, poc: <1 ng/mL — ABNORMAL LOW (ref 1.0–8.0)
CKMB, poc: <1 ng/mL — ABNORMAL LOW (ref 1.0–8.0)
Myoglobin, poc: 57.8 ng/mL (ref 12–200)
Myoglobin, poc: 69.3 ng/mL (ref 12–200)
Myoglobin, poc: 82 ng/mL (ref 12–200)
Troponin i, poc: <0.05 ng/mL (ref 0.00–0.09)
Troponin i, poc: <0.05 ng/mL (ref 0.00–0.09)
Troponin i, poc: <0.05 ng/mL (ref 0.00–0.09)

## 2010-12-04 LAB — HEMOGLOBIN A1C
Hgb A1c MFr Bld: 5.8 % (ref 4.6–6.1)
Mean Plasma Glucose: 120 mg/dL

## 2010-12-04 LAB — MAGNESIUM: Magnesium: 2.2 mg/dL (ref 1.5–2.5)

## 2010-12-04 LAB — D-DIMER, QUANTITATIVE: D-Dimer, Quant: 0.22 ug/mL-FEU (ref 0.00–0.48)

## 2011-01-02 NOTE — Assessment & Plan Note (Signed)
[facility]                       [place] [facility] OFFICE NOTE   Lori, Bell                       MRN:          [id number]  DATE:09/14/2008                            DOB:          07/05/1946    REFERRING PHYSICIAN:  Eduard Clos, MD   PRIMARY CARE PHYSICIAN:  [name], MD   PRIMARY CARDIOLOGIST:  Previously [facility].   HISTORY OF PRESENT ILLNESS:  Lori Bell is seen in the office after  admission to the hospitalist service at [facility] earlier this month  for chest pain.  She has had intermittent chest discomfort that was  evaluated by [facility] in 2008.  Those records have been  requested and/or  pending.  She apparently underwent cardiac  catheterization revealing moderate single-vessel disease, which was  thought a minimal to medical therapy.  She has had hypertension and  dyslipidemia, both of which have been well treated medically.  She has  never used tobacco products.  There is history of diabetes.  There is a  strong family history for coronary artery disease.  Myocardial  infarction was ruled out at [facility]  No subsequent testing was  undertaken.  She requested to switch Cardiologist and was referred here  for initial evaluation.   PAST MEDICAL HISTORY:  Otherwise notable for a number of minor surgical  procedures including 1 on the right ear in 1966, a tubal ligation in  1970 and an excisional left breast biopsy in 1997 for benign disease.  She has had colonoscopy within the past 10 years.   She has no known allergies.   CURRENT MEDICATIONS:  1. Protonix 40 mg daily.  2. Diovan HCT 160/12.5 mg daily.  3. Toprol-XL 50 mg daily.  4. Atorvastatin 20 mg daily.  5. Aspirin 325 mg daily.  6. Fish oil 1 capsule per day.  7. She uses Tylenol p.r.n..   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   FAMILY HISTORY:  Father died at age 75 due to cardiac problems and  mother at age 42 with a myocardial infarction.  Of 6 siblings, 1 has  died due to neoplastic disease and sister is alive, but with cardiac  problems.   REVIEW OF SYSTEMS:  Notable for cataracts in both eyes as of not yet  required surgery, the need for corrective lenses, upper and lower  dentures, a poor appetite but stable weight, GERD symptoms, and  arthritic discomfort of the left hip as well as the hands.  All other  systems reviewed and are negative.   PHYSICAL EXAMINATION:  GENERAL:  Pleasant woman in no acute distress.  VITAL SIGNS:  The weight is 164.  Blood pressure 155/95, heart rate is  72 and regular, respirations 12 and unlabored.  HEENT:  Normal lids and conjunctivae; EOM is full; normal oral mucosa.  NECK:  No jugular venous distention; no carotid bruits.  ENDOCRINE:  No thyromegaly.  SKIN:  No significant lesions.  HEMATOPOIETIC:  No adenopathy.  LUNGS:  Clear.  CARDIAC:  Normal first and second heart sounds; fourth heart sound  present; minimal systolic murmur.  Normal PMI.  ABDOMEN:  Soft and nontender; normal bowel sounds; no masses; no  organomegaly.  EXTREMITIES:  1+ ankle edema; normal distal pulses.  NEUROLOGIC:  Symmetric strength and tone; normal cranial nerves.   EKG:  Pending.   IMPRESSION:  Ms. Koike apparently has known coronary artery disease; her  previous cardiac catheterization will be reviewed when available.  Her  chest discomfort may or may not represent angina.  She is taking Toprol  at present.  If symptoms persist, an additional antianginal agent could  be added.  For now, we will better control hyperlipidemia and  hypertension by doubling the dose of her Diovan and of her atorvastatin.  A chemistry profile and lipid profile will be repeated in 2 months, at  which time I will reevaluate this nice woman.   Thanks for sharing her care  with me.     Gerrit Friends. Dietrich Pates, MD, [facility]  Electronically Signed    RMR/MedQ  DD: 09/14/2008  DT: 09/15/2008  Job #: [id number]

## 2011-01-02 NOTE — H&P (Signed)
Lori Bell, Lori Bell                ACCOUNT NO.:  [id number]   MEDICAL RECORD NO.:  [id number]          PATIENT TYPE:  INP   LOCATION:  3712                         FACILITY:  [facility]   PHYSICIAN:  Lori Bell, M.D.DATE OF BIRTH:  1945-09-13   DATE OF ADMISSION:  09/06/2008  DATE OF DISCHARGE:                              HISTORY & PHYSICAL   PRIMARY CARE PHYSICIAN:  Lori Bell, M.D., which makes the patient  unassigned to [facility].   CHIEF COMPLAINT:  Chest pain.   PRESENT ILLNESS:  Lori Bell is a very pleasant 65 year old  resident of [place], [place].  She presented to the [facility] today with complaints of chest pain.  Due to a staffing crisis  at [facility], decision was made that the patient would be best  transferred to [facility] for inpatient stay after her initial  evaluation and stabilization at [facility].  As a result, I was  contacted by Lori Bell at [facility] and arrangements were  made to transfer her here.   As per Lori Bell report, and as related to me directed by the  patient as well.  The patient was in her usual state of health until  this morning at approximately 11 o'clock when she suffered the acute  onset of a chest area pressure-type sensation.  She describes this as  starting in both breast and spreading equally up both sides of the chest  into the neck area.  This occurred while she was packing away Christmas  decorations.  She sat and rested for approximately 30 minutes at which  time her symptoms almost completely resolved.  She then stood and  attempted to resume her workup, putting away Christmas ornaments at  which time she experienced an acute and severe return/exacerbation of  her pain.  She, therefore, presented to the emergency room at [facility]  for evaluation.  She states that her pain persists even now.  The pain  has diminished significantly since its onset, but has not resolved, and  has been continuous.  It is present while she is resting and also when  she is moving around.  There has been no shortness of breath associated  with this.  There is no exacerbation with specific movements.  There has  been no diaphoresis.  The patient has not experienced these exact  symptoms previously.   Of note, the patient reports that she had a cardiac cath at the office  of [facility] approximately a year and a half ago.  She states that she was told it was normal.  The patient does not  smoke.  She does have hypertension and hyperlipidemia and a reported  positive family history.  She is not diabetic.   REVIEW OF SYSTEMS:  As comprehensive review of systems is unremarkable.   PAST MEDICAL HISTORY:  Please see details of Lori Bell note.   FAMILY HISTORY AND SOCIAL HISTORY:  As per Lori Bell admission  note.   DATA REVIEWED:  The patient has a potassium of 3.2 and a serum glucose  of 122 and a normal D-dimer.  Point care cardiac markers were negative  at the ER and [facility].  A 12-lead EKG from [facility] reveals normal  sinus rhythm at 65 beats per minute with no acute ST or T-wave change  and is essentially a normal EKG.   MEDICATIONS:  Please see those detailed by Lori Bell and his admit  note.   PHYSICAL EXAMINATION:  VITAL SIGNS:  Temperature 98.0, Bell pressure  142/77, heart rate 65, respiratory 24, O2 sats 98% on room air.  GENERAL:  Well-developed, well-nourished female in no acute respiratory  distress.  LUNGS:  Clear to auscultation bilaterally without wheezes or rhonchi.  CARDIOVASCULAR:  Regular rate and rhythm without murmur, gallop or rub.  Normal S1-S2.  ABDOMEN:  Nontender, nondistended, soft bowel sounds present.  No  organomegaly, rebound or ascites.  EXTREMITIES:  No significant cyanosis, clubbing, edema bilateral lower  extremities.   IMPRESSION AND PLAN:  1. Chest discomfort.  The patient's symptoms are indeed  atypical.  In      that the patient has been transferred here, we will proceed with a      rule for acute coronary syndrome with serial cardiac enzymes.  If      the patient's serial enzymes are negative and her EKG is      unrevealing, I do not feel that further inpatient evaluation will      be required, given the fact that she reportedly had a normal      cardiac cath approximately a year and a half ago.  We will attempt      to obtain records from [facility] in the      morning when their office is open.  If there are significant      findings on the serial enzymes or on EKG, then we will consider      pursuing inpatient cardiac evaluation.  2. Hypertension.  The patient's Bell pressure is presently reasonably      controlled.  We will continue her outpatient medications with the      exception of HCTZ and follow her Bell pressure trend closely.  3. Hyperlipidemia.  We will continue Lipitor.  We will check a fasting      lipid panel in the morning.  4. Elevated serum glucose.  The patient does not have a known history      of diabetes mellitus.  She does have a mildly elevated serum      glucose as per her admission B-met.  We will check a hemoglobin A1c      further in the morning and pursue this further as indicated.      Lori Bell, M.D.  Electronically Signed     JTM/MEDQ  D:  09/06/2008  T:  09/07/2008  Job:  [id number]

## 2011-01-02 NOTE — H&P (Signed)
NAME:  Lori Bell, Lori Bell                ACCOUNT NO.:  [id number]   MEDICAL RECORD NO.:  [id number]          PATIENT TYPE:  EMS   LOCATION:  ED                            FACILITY:  [facility]   PHYSICIAN:  Osvaldo Shipper, MD     DATE OF BIRTH:  [birth year]   DATE OF ADMISSION:  09/06/2008  DATE OF DISCHARGE:  LH                              HISTORY & PHYSICAL   PRIORITY ADMISSION HISTORY AND PHYSICAL   Patient actually will be transferred to [facility].   PRIMARY MEDICAL DOCTOR:  [name], MD.   She has been seen by [facility] in the past, though she  does not want to be seen by that group anymore.   ADMITTING DIAGNOSES:  1. Chest pain, rule out acute coronary syndrome.  2. History of chest pains in the past with cardiac catheterization in      the past.  3. Hypertension.  4. Dyslipidemia.   CHIEF COMPLAINT:  Left-sided chest pain since this morning.   HISTORY OF PRESENT ILLNESS:  The patient is a 65 year old Caucasian  female, who was in her usual state of health until about 11:30 this  morning when she started developing left-sided chest pain and then the  pain started moving to the retrosternal area.  The pain was 10 out of 10  in intensity, felt like an aching, pressure-like sensation.  She was  actually putting away her Christmas decorations when this pain started.  She was feeling shortness of breath, no palpitation, no fever, no cough,  no nausea or vomiting, no diaphoresis.  She does not have any history of  acid reflux.   She tells me that she has had cardiac catheterizations.  She has had 2  of them.  The last one was in 2008, and no stents were placed.   HOME MEDICATIONS:  1. Lipitor 10 mg daily.  2. Diovan/HCT 160/12.5 daily.  3. Protonix 40 mg daily.  4. Toprol-XL 50 mg daily.  5. Aspirin 81 mg daily.   ALLERGIES:  No known drug allergies.   PAST MEDICAL HISTORY:  Positive for:  1. Dyslipidemia.  2. Hypertension.  3.  Colonoscopy and EGD in 2008.  4. Tubal ligation.  5. Biopsies of her left breast.  6. Left ear surgery for eardrum perforation.   SOCIAL HISTORY:  Lives in [place] with her husband, no smoking or  alcohol use, no illicit drug use, fairly independent and does daily  activities.   FAMILY HISTORY:  Positive for MI in both her parents, and her mother  actually died of a massive MI age of 60.   REVIEW OF SYSTEMS:  GENERAL REVIEW OF SYSTEMS:  Positive for weakness,  malaise.  HEENT:  Unremarkable.  CARDIOVASCULAR:  As in HPI.  GI:  Unremarkable.  GU:  Unremarkable.  NEUROLOGICAL:  Unremarkable.  PSYCHIATRIC:  Unremarkable.  Other systems unremarkable.   PHYSICAL EXAMINATION:  VITAL SIGNS:  Temperature is 98.0, blood pressure  129/65, heart rate 64, respiratory rate 16, saturation 97% on room air.  GENERAL:  This is a thin white female in no  distress.  HEENT:  There is no pallor and no icterus.  Oral mucous membrane is  moist.  No oral lesions are noted.  NECK:  Soft and supple, no thyromegaly is appreciated.  LUNGS:  Clear to auscultation bilaterally, and no wheezing, rales, or  rhonchi.  CARDIOVASCULAR:  S1 S2 is normal, regular, and no murmurs appreciated,  no S3 S4, no rubs, no bruits.  Chest pain was not reproducible.  ABDOMEN:  Soft, no tenderness, no rebound or rigidity, bowel sounds were  present, no masses were present.  EXTREMITIES:  No edema, peripheral pulses are palpable.  NEUROLOGICAL:  She is alert and oriented x3.  No focal neurological  deficits are present.   LABORATORY DATA:  CBC is unremarkable.  BMET showed a potassium of 3.2,  glucose 122.  Cardiac enzymes are negative x3.  Chest x-ray did not show  any acute findings.  EKG showed a sinus rhythm with possible left axis  deviation, intervals appear to be in the normal range, no definite Q-  waves are noted, no concerning ST or T-wave changes are noted.   ASSESSMENT:  This is a 65 year old Caucasian female, who  has a past  medical history of hypertension and dyslipidemia, who presents with left-  sided chest pain since this morning.  The pain appears to be somewhat  atypical for coronary artery disease.  She does have some risk factors;  however, she appears to have had a cardiac catheterization about a year-  and-a-half ago, which was thought to show minimal coronary artery  disease.   PLAN:  Chest pain:  We will need to rule her out for acute coronary  syndrome.  She does not want to be seen by [facility] and  would prefer [facility] if they are consulted.  Aspirin and beta  blocker will be continued.  She is also on Diovan/HCT, which will be  continued.  EKGs will need to be repeated.  This will likely be an  atypical chest pain and she likely will rule out for acute coronary  syndrome.  A lipid profile will also be checked.   I have discussed this case with Dr. Sharon Seller with [facility] at [facility]  and he has accepted this patient in transfer.  All of the above was  discussed with him in detail.  The patient has been informed of the  pending transfer to [facility] as well.      Osvaldo Shipper, MD  Electronically Signed     GK/MEDQ  D:  09/06/2008  T:  09/06/2008  Job:  [id number]

## 2011-01-02 NOTE — Discharge Summary (Signed)
Lori Bell, Lori Bell                ACCOUNT NO.:  [id number]   MEDICAL RECORD NO.:  [id number]          PATIENT TYPE:  INP   LOCATION:  3712                         FACILITY:  [facility]   PHYSICIAN:  Eduard Clos, MDDATE OF BIRTH:  [birth year]   DATE OF ADMISSION:  09/06/2008  DATE OF DISCHARGE:                               DISCHARGE SUMMARY   COURSE IN HOSPITAL:  A 65 year old female with a known history of  hypertension and hyperlipidemia who has had a cardiac cath a year and a  half ago which was normal.  He was admitted for chest pain.  The patient  had serum cardiac enzymes which were negative.  EKG did not show  anything acute.  Chest x-ray was done also showing nothing acute.  As  the patient's symptoms were largely resolved and the patient is  ambulating without any symptoms, the patient is eager to go home.  We  will discharge home with followup with Cardiology and Primary Care.  During the stay, the patient also was found to have LDL level which was  127.  Her Lipitor was increased from 10 to 20.  She also had mild  hypoglycemia, but hemoglobin A1c was 5.8.  The patient was advised a  close followup on her hypoglycemia.  By the time of discharge, the  patient was hemodynamically stable.   PROCEDURES DURING THE STAY:  Chest x-ray on September 06, 2008, showed  nothing acute.   FINAL DIAGNOSES:  1. Chest pain.  Ruled out for acute coronary syndrome.  2. Hypertension.  3. Hyperlipidemia.  4. Hypoglycemia with hemoglobin A1c of 5.8.   MEDICATIONS:  At discharge:  1. Protonix 40 mg p.o. daily.  2. Lipitor 20 mg p.o. daily.  3. Toprol-XL 50 mg p.o. daily.  4. Diovan HCTZ 160/12.5 mg p.o. daily.  5. Aspirin 325 mg p.o. daily.  6. Fish oil 1000 mg p.o. daily.   PLAN:  The patient is advised to follow up with her primary care  physician within a week's time and recheck a C-MET, as the patient's  Lipitor has been increased and also a close followup on her  hypoglycemia.   This patient has requested a change in her cardiologist  and wants to follow up with [facility] at [facility].  I have  got an appointment with Dr. Dietrich Pates on September 14, 2008, at 1:30 p.m.  The patient advised to keep the appointment for  which she has agreed.  In the event of any recurrence of symptom, to go  to the nearest ER.  The patient is to be on a cardiac healthy diet.   ACTIVITY:  As tolerated.      Eduard Clos, MD  Electronically Signed     ANK/MEDQ  D:  09/08/2008  T:  09/08/2008  Job:  [id number]

## 2011-01-02 NOTE — Letter (Signed)
November 30, 2008    [name], MD  [address]  [place], [place] [address]   RE:  Lori Bell, Lori Bell  MRN:  [id number]  /  DOB:  08-18-1946   Dear Lori Bell:   Ms. Aerts returns to the office for continued assessment and treatment  of coronary artery disease and cardiovascular risk factors.  Since her  last visit, she has improved substantially.  She notes decreased  fatigue, although she still finds herself needing to rest when she does  yard work.  She has had no dyspnea nor chest discomfort.  She has  tolerated increased doses of Diovan and atorvastatin without adverse  effects.   Past medical records were obtained and reviewed.  Her cardiac  catheterization in July 2008 was quite good with the worst lesion being  a 40% stenosis in a small RCA that was codominant.  LV systolic function  was normal.   CURRENT MEDICATIONS:  1. Protonix 40 mg daily.  2. Diovan HCT 320/25 mg daily.  3. Toprol 50 mg daily.  4. Atorvastatin 40 mg daily.  5. Aspirin 81 mg daily.  6. Fish oil 1000 mg daily.  7. She has p.r.n. nitroglycerin, which she has not used.   PHYSICAL EXAMINATION:  GENERAL:  A pleasant woman in no acute distress.  VITAL SIGNS:  Her weight is 167, 3 pounds more than at her last visit.  Blood pressure 130/80, heart rate 72 and regular, and respirations 12  and unlabored.  NECK:  No jugular venous distention; no carotid bruits.  LUNGS:  Clear.  CARDIAC:  Normal first and second heart sounds; minimal systolic murmur.  ABDOMEN:  Soft and nontender; no bruits; no organomegaly.  EXTREMITIES:  Distal pulses 2+ on the right; 1-2+ on the left.  No  edema.   Lipid profile is improved with total cholesterol of 176, triglycerides  of 189, HDL of 48, and LDL of 90.  Chemistry profile and hepatic profile  were normal.   IMPRESSION:  [name] is doing very well overall.  She has mild  coronary artery disease, at least as of cardiac catheterization a year  and half ago.  If  she is experiencing ischemic chest discomfort, it is  likely related to spasm.  She will use nitroglycerin on a p.r.n. basis.  If she suffers recurrences, consideration will be given to use of a long-  acting nitrate or calcium channel antagonist.   Hypertension is under good control.  She will continue to monitor this  at home and continue her current medications.  She has no hypokalemia  related to her moderate dose of HCTZ.   Dyslipidemia is well controlled.  She will continue her increased dose  of metoprolol plus fish oil.  I will reassess this nice woman in 7  months.    Sincerely,      Gerrit Friends. Dietrich Pates, MD, [facility]  Electronically Signed    RMR/MedQ  DD: 11/30/2008  DT: 12/01/2008  Job #: [id number]

## 2011-01-05 NOTE — H&P (Signed)
NAME:  Lori Bell, Lori Bell                          ACCOUNT NO.:  [id number]   MEDICAL RECORD NO.:  [id number]                   PATIENT TYPE:  INP   LOCATION:  A209                                 FACILITY:  [facility]   PHYSICIAN:  Madelin Rear. Sherwood Gambler, M.D.             DATE OF BIRTH:  August 31, 1945   DATE OF ADMISSION:  02/18/2004  DATE OF DISCHARGE:                                HISTORY & PHYSICAL   CHIEF COMPLAINT:  Cough, chest pain.   HISTORY OF PRESENT ILLNESS:  The patient has had a cough as well as multiple  nights and days of chest discomfort described as a squeezing pressure like  sensation.  She has been getting awakened by night with orthopnea as well as  a cough that has been scantily productive.  No fever, rigors, or chills.  She admits to an increase in pain at night as well and has been  surreptitiously taking aspirin for this pain with some relief.  She also has  been complaining of increased pain and weakness in her left lower extremity  with a recent admission for a severe herniated nucleus pulposus.  The new  neurologic symptoms, she has been having, is incontinence of urine.  She has  also noted increasing weakness and difficulty supporting her weight on her  left leg.  The pain has also gotten somewhat worse the last 24-48 hours.   PAST MEDICAL HISTORY:  1. Hypertension.  2. Status post BTL.  3. Status post ruptured right eardrum.  4. Left breast nodules in 1998.   SOCIAL HISTORY:  She does not smoke.  No alcohol or other substance uses.   FAMILY HISTORY:  Noncontributory except for a father with heart disease.  Mother had diabetes, heart problems, as well as hypertension.  There is  coronary disease in maternal grandparents as well.  She has one brother with  heart disease, and one sister with heart disease and diabetes, and another  brother that is deceased from colon cancer.  Her children are otherwise  healthy.  She has one son on dialysis from chronic renal  failure.   PHYSICAL EXAMINATION:  SKIN:  Unremarkable.  HEAD/NECK:  No JVD or adenopathy.  Neck is supple.  CHEST:  Scattered rhonchi.  CARDIAC:  Regular rhythm without gallop or rub.  ABDOMEN:  Soft.  No organomegaly or masses.  EXTREMITIES:  Positive straight leg left lower extremity.  DTRs are present  bilaterally.  She has diminished extension ability on the left but no gross  paresis.   IMPRESSION:  1. 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     We need to address this with an urgent neurosurgical consultation.  2. Chest pain.  This could be angina pectoris, unstable angina, or     myocardial infarction.  A cardiac workup and enzymes will be obtained.     Rather than delay and try to get a bed  down at [facility], we     will admit her to [facility] first, get some enzymes.  In the     meantime, I will contact Dr. Venetia Maxon who has an extant appointment with the     patient next week.  3. Hypertension, well controlled at present.  Monitor for any change.  4. Urinary incontinence, possibly due to the neurologic sequelae of a disk;     however, a urinary tract infection needs to be ruled out with urinalysis.     ___________________________________________                                         Madelin Rear. Sherwood Gambler, M.D.   LJF/MEDQ  D:  02/18/2004  T:  02/18/2004  Job:  [id number]

## 2011-01-05 NOTE — Consult Note (Signed)
NAME:  Lori Bell, Lori Bell                          ACCOUNT NO.:  [id number]   MEDICAL RECORD NO.:  [id number]                   PATIENT TYPE:  INP   LOCATION:  A213                                 FACILITY:  [facility]   PHYSICIAN:  J. Darreld Mclean, M.D.              DATE OF BIRTH:  July 11, 1946   DATE OF CONSULTATION:  DATE OF DISCHARGE:                                   CONSULTATION   CHIEF COMPLAINT:  My back hurts.   HISTORY OF PRESENT ILLNESS:  The patient is a 65 year old female with  history of severe onset of low back pain, left sided paresthesias yesterday.  Dr. Sherwood Gambler admitted her last evening.  She also had a fever of 101, marked  shift to the left, neutrophils.  However, sed rate is 8 today, white count  is 4700, shift is not as great.  The patient denies any falls, trauma,  unusual injuries, denies any heavy lifting.   ASSESSMENT:  Occasional back pain that got acutely worse yesterday.  The  pain radiates down her left leg.  It has been somewhat relieved here in the  hospital with bed rest and medications.   The patient had MRI done.  MRI shows herniated nucleus pulposus broad based,  pushing against on the left side at L4-L5 involved with nerve root there and  also some slight changes at L5-S1 involving the nerve root but not as  extensive as at the L4-L5 inner space.  She has weakness of the left great  toe, dorsal extensor and numbness in the L5 dermatome nerve root.  She has  positive straight leg raising on the left at supine that are approximately  15-20 degrees negative on the right.  Reflexes are bilateral and equal,  however.  I did not have her stand.  She was having pain.  There was no  spasms.   IMPRESSION:  Low back pain with herniated nucleus pulposus involving the L4-  L5 inner space and L5 nerve root with some slight changes to L5-S1 area.  These were all affecting the left side.   The patient has fever of unknown origin.   RECOMMENDATIONS:  Normally, I would  give the patient some prednisone.  However, with her fever of unknown origin, this is not indicated,  contraindicated in fact.  Recommend Celebrex 200 mg once a day after eating.  Observe and watch for fever, then hopefully she will respond to the  antibiotics and anti-inflammatory.  The patient may need to see a  neurosurgeon for possible surgery if no sign of improvement with service  treatment.  Will follow with you.      ___________________________________________                                            Lori Bell,  M.D.   Lori Bell  D:  02/03/2004  T:  02/03/2004  Job:  [id number]

## 2011-01-05 NOTE — Consult Note (Signed)
NAME:  Lori Bell, Lori Bell                          ACCOUNT NO.:  [id number]   MEDICAL RECORD NO.:  [id number]                   PATIENT TYPE:  INP   LOCATION:  3010                                 FACILITY:  [facility]   PHYSICIAN:  Hilda Lias, M.D.                DATE OF BIRTH:  [birth year]   DATE OF CONSULTATION:  02/19/2004  DATE OF DISCHARGE:  02/19/2004                                   CONSULTATION   HISTORY OF PRESENT ILLNESS:  Yesterday, I was called by Dr. Artis Delay from  [facility] to let me that Lori Bell is a lady that asked to be seen  by Dr. Venetia Maxon in our office but she showed up to [facility]  Emergency Room complaining of back pain, shortness of breath and cough.  This patient had MRI of the lumbar spine back on February 02, 2004.  According  to Dr. Sherwood Gambler, he was to keep the patient at [facility] for a workup  to rule out the possibility of any cardiovascular complications.  He was  concerned because she was having increased numbness and weakness of the left  leg and also urinary incontinence.  This morning Dr. Sherwood Gambler called me to let  me know that she was cleared from the cardiovascular review and the patient  needed to be transferred to [facility] because he felt she might  have a cauda equina syndrome.  Because of this possibility, I told them to  transfer the patient to [facility].  The patient came by [facility].  She is with her husband and daughter.  What surprised me is that this  patient was not in any acute distress whatsoever.  She and the family tell  me that she is having back pain off and on for several years which is  getting worse lately.  She had an MRI of the lumbar spine on January 30, 2004.  She denies any pain down to either leg.  She denies any problems with her  strength.  She also complains of numbness that goes to the left leg into the  ankle but not into the foot.  She has been unable to have a bowel movement  for  the past two days.  She tells me she is sometimes she has difficulty  going to the bathroom to urinate.  The patient is at the present time in no  acute distress.  She is worried as thinks she might have some kind of  arthritis in the lumbar spine.   PHYSICAL EXAMINATION:  GENERAL:  Clinically, this lady is in no acute  distress.  HEENT:  Ears, nose and throat normal.  Her voice is a little bit hoarse.  NECK:  Normal.  LUNGS:  There are some rhonchi bilaterally.  ABDOMEN:  Normal.  EXTREMITIES:  Normal tone.  NEUROMENTAL STATUS:  Normal.  Cranial nerves normal.  Strength is 5/5 in the  upper and lower extremities.  There is no __________ .  Reflexes  symmetrical.  Sensation is normal to light touch.  She complains of numbness  which seems to be at the L4 or L5 nerve root, but it is difficult to say.  There is no sciatic notch tenderness.  She is able to walk on tiptoes and  heels and squat with minimal discomfort.  __________ positive bilateral  __________ .  RECTAL:  The rectal tone is normal.   LABORATORY DATA:  The MRI which was done on February 02, 2004, shows that she  has slight compression of the L5 nerve root, but this is questionable.  She  also has a tiny left foraminal protrusion at the level of L5-S1.  Also, she  has bilateral facet hypertrophy and thickening of the ligament of flavum but  the foramina is patent.   RECOMMENDATIONS:  I talked to her, her husband and daughter at length.  I  told all of them that from my point of view clinically and by x-ray she does  not really have a cauda equina syndrome and the changes on the MRI are  really subtle and I do not think this lady requires any surgery now as an  emergency or later on.  I told her that I am really more concerned about her  lung problem and the possibility that she might have a urinary tract  infection.  I told all of three of them that I would be more than happy to  keep her in the hospital but she feels that  she preferred to go home and be  followed by [place] in our office and also follow up with Dr. Sherwood Gambler.  I  personally called Dr. Sherwood Gambler at [facility], but he was not on call.  I talked to Dr. Nobie Putnam and explained the problem.  The patient is going to  go home.  I gave her some Vicodin for pain in case the pain gets worse and  also some Relafen to be taken 750 mg at night time.  She was also advised to  take a laxative.  She is going to call Dr. Sherwood Gambler for a followup evaluation  of her bladder and the lungs.  From my view, I told her I would be call for  on for the next three days and to give me a call if there was any problem.  Otherwise, I will follow up with her in my office.                                               Hilda Lias, M.D.    EB/MEDQ  D:  02/19/2004  T:  02/21/2004  Job:  [id number]

## 2011-01-05 NOTE — Discharge Summary (Signed)
NAME:  Lori Bell, Lori Bell                          ACCOUNT NO.:  [id number]   MEDICAL RECORD NO.:  [id number]                   PATIENT TYPE:  INP   LOCATION:  A213                                 FACILITY:  [facility]   PHYSICIAN:  Madelin Rear. Sherwood Gambler, M.D.             DATE OF BIRTH:  [birth year]   DATE OF ADMISSION:  02/02/2004  DATE OF DISCHARGE:  02/05/2004                                 DISCHARGE SUMMARY   DISCHARGE MEDICATIONS:  1. Medrol Dosepak.  2. Duragesic 100 mcg q.3d.  3. Tylox p.r.n.  4. Flexeril 10 mg t.i.d. p.r.n.  5. Continuation of home antihypertensive regimen.   DISCHARGE DIAGNOSES:  1. 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  2. Hypertension, chronic, stable.  3. Hematochezia secondary to hemorrhoidal bleed.  4. Uterine leiomyomata.  5. Fever, question source.   SUMMARY:  Patient was admitted with severe unrelenting back pain limiting  her motion as well as a fever.  Workup included orthopedic consultation,  imaging of the spine; interventions included analgesia and anti-inflammatory  medications as well as antibiotic coverage pending cultures.  No source of  fever was ever uncovered, however, the patient did respond well to  interventions and was discharged for outpatient followup.  A CT abdomen and  pelvis revealed incidental uterine fibroids.  She was referred to gynecology  for followup of that as well.  Her MRI did reveal a broad-based posterior  disk herniation L4-5 with mass effect on the L5 root.  This was adequate  explanation for her back pain.     ___________________________________________                                         Madelin Rear. Sherwood Gambler, M.D.   LJF/MEDQ  D:  02/23/2004  T:  02/23/2004  Job:  [id number]

## 2011-01-05 NOTE — Group Therapy Note (Signed)
NAME:  FRAN, NEISWONGER                          ACCOUNT NO.:  [id number]   MEDICAL RECORD NO.:  [id number]                   PATIENT TYPE:  INP   LOCATION:  A213                                 FACILITY:  [facility]   PHYSICIAN:  J. Darreld Mclean, M.D.              DATE OF BIRTH:  1946-06-16   DATE OF PROCEDURE:  DATE OF DISCHARGE:                                   PROGRESS NOTE   SUBJECTIVE:  The patient's low back pain is improved today, she is less  painful, less tender.  Dr. Kirk Ruths has started her on some  prednisone and will begin physical therapy.   OBJECTIVE:  She still has some weakness but her pain is much less.  Neurologically unchanged.      ___________________________________________                                            [name], M.D.   JWK/MEDQ  D:  02/04/2004  T:  02/04/2004  Job:  [id number]

## 2011-01-05 NOTE — Procedures (Signed)
[place]. [facility]  Patient:    Lori Bell, Lori Bell Visit Number: [id number] MRN: [id number]          Service Type: END Location: ENDO Attending Physician:  Nelda Marseille Dictated by:   Petra Kuba, M.D. Proc. Date: 01/26/02 Admit Date:  01/26/2002   CC:         [name], M.D.   Procedure Report  PROCEDURE PERFORMED:  Colonoscopy.  ENDOSCOPIST:  Petra Kuba, M.D.  INDICATIONS FOR PROCEDURE:  The patient had a history of colon polyps, family history of colon cancer, due for repeat screening.  Consent was signed after risks, benefits, methods, and options were thoroughly discussed in the office in the past.  MEDICATIONS USED:  Demerol 40 mg, Versed 6 mg.  DESCRIPTION OF PROCEDURE:  Rectal inspection was pertinent for external hemorrhoids small.  Digital exam was negative.  A video colonoscope was inserted and easily advanced around the colon to the cecum.  This did not require any abdominal pressure or any position changes.  No obvious abnormality was seen on insertion.  The sigmoid was identified by the appendiceal orifice and the ileocecal valve.  Prep was adequate.  There was some liquid stool that required washing and suctioning.  On slow withdrawal through the colon, the cecum, ascending, transverse, descending and sigmoid were all normal.  There were no signs of any polyps or diverticula.  In the rectum a few tiny hyperplastic-appearing polyps were seen and were not biopsied.  The scope was retroflexed and pertinent for some internal hemorrhoids.  The scope was straightened and readvanced a short ways up the left side of the colon.  Air was suctioned, scope removed.  The patient tolerated the procedure well.  There was no obvious immediate complication.  ENDOSCOPIC DIAGNOSIS: 1. Internal and external hemorrhoids. 2. A few tiny hyperplastic-appearing polyps on the right, but not biopsied. 3. Otherwise within normal limits to the  cecum.  PLAN:  Yearly rectals and guaiacs per Dr. Nobie Putnam.  Happy to see back p.r.n. Otherwise recommend repeat screening in five years unless needed sooner p.r.n. Dictated by:   Petra Kuba, M.D. Attending Physician:  Nelda Marseille DD:  01/26/02 TD:  01/27/02 Job: [id number]

## 2011-01-05 NOTE — H&P (Signed)
NAME:  Lori Bell, Lori Bell                          ACCOUNT NO.:  [id number]   MEDICAL RECORD NO.:  [id number]                   PATIENT TYPE:  INP   LOCATION:  A213                                 FACILITY:  [facility]   PHYSICIAN:  Madelin Rear. Sherwood Gambler, M.D.             DATE OF BIRTH:  01-14-1946   DATE OF ADMISSION:  02/02/2004  DATE OF DISCHARGE:                                HISTORY & PHYSICAL   CHIEF COMPLAINT:  Back pain.   HISTORY OF PRESENT ILLNESS:  The patient was in her usual state of health  until early a.m. hours.  She developed some rectal bleeding, which she  described as modest.  It was not mixed with the stool nor melanotic, but  bright red blood per rectum.  She has had previous hemorrhoids and thought  that this was probably a hemorrhoidal bleed.  She had no orthostatic  symptoms or injury or falls.  Shortly thereafter, while driving to work, the  patient developed sudden onset of severe shaking chills, true rigors and  severe back pain, described as paralumbar.  This was associated with  radicular pain, radiating down mostly the left leg, but also the right leg  and accompanied by leg numbness without any motor deficit.  She felt quite  ill and nauseous, although she did not vomit.  She has had no cough or  antecedent rash or known tick bites.   PAST MEDICAL HISTORY:  1. Hypertension.  2. Hyperlipidemia.  3. Osteoporosis.   She is maintained on an antihypertensive and statin drug regimen for these.   She has no known medical allergies.   SOCIAL HISTORY:  Married, lives with husband.  Otherwise, noncontributory.   FAMILY HISTORY:  Noncontributory.   REVIEW OF SYSTEMS:  Under HPI.  All else is negative.   PHYSICAL EXAMINATION:  GENERAL:  She is acutely distressed, status post ER  visitation, where she subsequently left the emergency department and came to  my office.  VITAL SIGNS:  She had a low grade fever of 100.0.  HEAD AND NECK:  No JVD or adenopathy.  Neck was  supple.  CHEST:  Clear.  CARDIAC:  Regular rate and rhythm without murmurs, gallops or rubs.  ABDOMEN:  Soft.  No organomegaly or masses.  No bruits.  No guarding or  rebound tenderness in the abdomen.  EXTREMITIES:  Bilateral femoral pulses were normal.  No bruits.  NEUROLOGIC:  Positive straight legs bilaterally, left greater than right,  but DTRs and motor examinations were normal and sensory examination was  equivocal, probably normal.  She had no pathologic reflexes.   LABORATORY STUDIES:  The patient was sent from the office by ambulance back  to the emergency room where I visited with her myself due to her apparent  discomfort with seeing the ER physician again.  Workup included a CT scan  with contrast of the abdomen, which was unrevealing and had no  explanation  for the anatomic distribution or symptoms.  White count was normal.  However, there was a left shift.  Sed rate was normal.  Urinalysis was  negative.  Electrolytes were unrevealing.   IMPRESSION:  1. Lumbar radiculitis, question etiology disc versus rule out of epidural     abscess.  Stat MRI of the LS spine is pending at the present time.  This     will be accomplished and reviewed when available.  2. Fever - Question source.  Of course, if the MRI is revealing of an     abscess that is the source.  Possible osteomyelitis of the spine, etc.     Blood cultures were obtained and are pending at present.  She will be     covered for anti-Staphylococcus coverage with Ancef at present.  3. Hypertension - Well controlled at present.  Monitor for any     deterioration.  4. Hematochezia - Probably hemorrhoidal.  Monitor serial creatinines.     Consult GI as indicated for colonoscopy/proctoscopy.  Orthopaedic     consultation is also anticipated in the morning.   The patient requires admission for the above reasons.  Diagnostic studies  will be performed.  Bed rest as well as adequate analgesia.      ___________________________________________                                         Madelin Rear. Sherwood Gambler, M.D.   LJF/MEDQ  D:  02/02/2004  T:  02/02/2004  Job:  [id number]

## 2011-01-05 NOTE — Discharge Summary (Signed)
NAME:  Lori Bell, Lori Bell                          ACCOUNT NO.:  [id number]   MEDICAL RECORD NO.:  [id number]                   PATIENT TYPE:  OBV   LOCATION:  3010                                 FACILITY:  [facility]   PHYSICIAN:  Madelin Rear. Sherwood Gambler, M.D.             DATE OF BIRTH:  1946/01/07   DATE OF ADMISSION:  02/19/2004  DATE OF DISCHARGE:  02/19/2004                                 DISCHARGE SUMMARY   DISCHARGE DIAGNOSES:  1. Chest pain.  2. Herniated nucleus pulposus, rule out cauda equina syndrome.   DISPOSITION:  Transfer to [facility].   SUMMARY:  The patient was admitted to the hospital with cough and chest pain  after multiple nights of chest discomfort and a squeezing, pressure-like  sensation.  She had a past medical history of hypertension.  She has also  had increasing pain and weakness of her left lower extremity with a recent  admission for severe back pain and a herniated nucleus pulposus.  She was  admitted for rule out myocardial infarction protocol.  This was accomplished  and subsequently consultation ensued with neurosurgery.  Dr. Jeral Fruit accepted  the case in transfer and she was transferred in stable condition.     ___________________________________________                                         Madelin Rear. Sherwood Gambler, M.D.   LJF/MEDQ  D:  03/15/2004  T:  03/15/2004  Job:  [id number]

## 2011-08-16 ENCOUNTER — Encounter: Payer: Self-pay | Admitting: Cardiology

## 2011-10-22 ENCOUNTER — Encounter: Payer: Self-pay | Admitting: Pulmonary Disease

## 2011-12-06 ENCOUNTER — Encounter: Payer: Self-pay | Admitting: Pulmonary Disease

## 2012-07-21 ENCOUNTER — Emergency Department (HOSPITAL_COMMUNITY): Payer: Medicare Other

## 2012-07-21 ENCOUNTER — Encounter (HOSPITAL_COMMUNITY): Payer: Self-pay | Admitting: *Deleted

## 2012-07-21 ENCOUNTER — Emergency Department (HOSPITAL_COMMUNITY)
Admission: EM | Admit: 2012-07-21 | Discharge: 2012-07-21 | Disposition: A | Discharge status: Home / Self Care | Payer: Medicare Other | Attending: Emergency Medicine | Admitting: Emergency Medicine

## 2012-07-21 DIAGNOSIS — S42201A Unspecified fracture of upper end of right humerus, initial encounter for closed fracture: Secondary | ICD-10-CM

## 2012-07-21 DIAGNOSIS — Z79899 Other long term (current) drug therapy: Secondary | ICD-10-CM | POA: Insufficient documentation

## 2012-07-21 DIAGNOSIS — Z7982 Long term (current) use of aspirin: Secondary | ICD-10-CM | POA: Insufficient documentation

## 2012-07-21 DIAGNOSIS — Y92009 Unspecified place in unspecified non-institutional (private) residence as the place of occurrence of the external cause: Secondary | ICD-10-CM | POA: Insufficient documentation

## 2012-07-21 DIAGNOSIS — I2510 Atherosclerotic heart disease of native coronary artery without angina pectoris: Secondary | ICD-10-CM | POA: Insufficient documentation

## 2012-07-21 DIAGNOSIS — Y93H1 Activity, digging, shoveling and raking: Secondary | ICD-10-CM | POA: Insufficient documentation

## 2012-07-21 DIAGNOSIS — Z8719 Personal history of other diseases of the digestive system: Secondary | ICD-10-CM | POA: Insufficient documentation

## 2012-07-21 DIAGNOSIS — E785 Hyperlipidemia, unspecified: Secondary | ICD-10-CM | POA: Insufficient documentation

## 2012-07-21 DIAGNOSIS — Z9889 Other specified postprocedural states: Secondary | ICD-10-CM | POA: Insufficient documentation

## 2012-07-21 DIAGNOSIS — W1789XA Other fall from one level to another, initial encounter: Secondary | ICD-10-CM | POA: Insufficient documentation

## 2012-07-21 DIAGNOSIS — S42293A Other displaced fracture of upper end of unspecified humerus, initial encounter for closed fracture: Secondary | ICD-10-CM | POA: Insufficient documentation

## 2012-07-21 DIAGNOSIS — I10 Essential (primary) hypertension: Secondary | ICD-10-CM | POA: Insufficient documentation

## 2012-07-21 MED ORDER — HYDROMORPHONE HCL PF 1 MG/ML IJ SOLN
1.0000 mg | Freq: Once | INTRAMUSCULAR | Status: AC
  Administered 2012-07-21: 1 mg via INTRAVENOUS
  Filled 2012-07-21: qty 1

## 2012-07-21 MED ORDER — ONDANSETRON HCL 4 MG/2ML IJ SOLN
4.0000 mg | Freq: Once | INTRAMUSCULAR | Status: AC
  Administered 2012-07-21: 4 mg via INTRAVENOUS
  Filled 2012-07-21: qty 2

## 2012-07-21 MED ORDER — ONDANSETRON HCL 8 MG PO TABS: 8.0000 mg | ORAL_TABLET | ORAL | Status: AC | PRN

## 2012-07-21 MED ORDER — OXYCODONE-ACETAMINOPHEN 5-325 MG PO TABS: 1.0000 | ORAL_TABLET | Freq: Four times a day (QID) | ORAL | Status: DC | PRN

## 2012-07-21 NOTE — ED Provider Notes (Signed)
History   This chart was scribed for Lori Hutching, MD by Sofie Rower, ED Scribe. The patient was seen in room APA14/APA14 and the patient's care was started at 3:09PM.     CSN: [id number]  Arrival date & time 07/21/12  1234   First MD Initiated Contact with Patient 07/21/12 1509      Chief Complaint  Patient presents with  . Fall    (Consider location/radiation/quality/duration/timing/severity/associated sxs/prior treatment) The history is provided by the patient. No language interpreter was used.    Lori Bell is a 66 y.o. female , who presents to the Emergency Department complaining of sudden, progressively worsening, arm pain, located at the right proximal humerus, onset today (07/21/12). The pt reports she was in the yard raking leafs earlier this afternoon, where she suddenly slipped, fell, and landed upon her back. The pt does not clearly recall all of the details of the fall, however, she is experiencing pain within her right upper extremity. Modifying factors include certain movements and positions of the right upper extremity which intensifies the arm pain.   The pt has a hx of hypertension, hyperlipidemia, GERD, and ASCVD.   The pt denies LOC and hitting her head during the fall. In addition, the pt denies any other injuries at this time.   The pt does not smoke or drink alcohol.   PCP is Dr. Juanetta Gosling.    Past Medical History  Diagnosis Date  . Hypertension   . Hyperlipidemia   . GERD (gastroesophageal reflux disease)   . ASCVD (arteriosclerotic cardiovascular disease)     Past Surgical History  Procedure Date  . Inner ear surgery 1966    Right  . Breast biopsy 1997    Left  . Tubal ligation 1970  . Total abdominal hysterectomy w/ bilateral salpingoophorectomy 05/2009    Family History  Problem Relation Age of Onset  . Coronary artery disease Other     History  Substance Use Topics  . Smoking status: Unknown If Ever Smoked  . Smokeless tobacco: Not on  file  . Alcohol Use: No    OB History    Grav Para Term Preterm Abortions TAB SAB Ect Mult Living                  Review of Systems  All other systems reviewed and are negative.    Allergies  Review of patient's allergies indicates no known allergies.  Home Medications   Current Outpatient Rx  Name  Route  Sig  Dispense  Refill  . ASPIRIN 81 MG PO TABS   Oral   Take 81 mg by mouth daily.          . DESVENLAFAXINE SUCCINATE ER 50 MG PO TB24   Oral   Take 50 mg by mouth daily.         . OMEGA-3 FATTY ACIDS 1000 MG PO CAPS   Oral   Take 1 capsule by mouth daily.           Marland Kitchen ROSUVASTATIN CALCIUM 20 MG PO TABS   Oral   Take 20 mg by mouth daily.         Marland Kitchen VALSARTAN-HYDROCHLOROTHIAZIDE 160-12.5 MG PO TABS   Oral   Take 1 tablet by mouth daily.           BP 127/59  Pulse 70  Temp 97.9 F (36.6 C) (Oral)  Resp 18  SpO2 97%  Physical Exam  Nursing note and vitals reviewed. Constitutional: She  is oriented to person, place, and time. She appears well-developed and well-nourished.  HENT:  Head: Normocephalic and atraumatic.  Eyes: Conjunctivae normal and EOM are normal. Pupils are equal, round, and reactive to light.  Neck: Normal range of motion. Neck supple.  Cardiovascular: Normal rate, regular rhythm and normal heart sounds.   Pulmonary/Chest: Effort normal and breath sounds normal.  Abdominal: Soft. Bowel sounds are normal.  Musculoskeletal: Normal range of motion. She exhibits tenderness.       Tender within the right proximal humerus. Painful with ROM.   Neurological: She is alert and oriented to person, place, and time.  Skin: Skin is warm and dry.  Psychiatric: She has a normal mood and affect.    ED Course  Procedures (including critical care time)  DIAGNOSTIC STUDIES: Oxygen Saturation is 97% on room air, normal by my interpretation.    COORDINATION OF CARE:  3:16 PM- Treatment plan concerning x-ray results, pain management, and  immobilization of the right upper extremity discussed with patient. Pt agrees with treatment.   3:17PM- Pt's family member requests follow up with Dr. Teressa Senter.      Labs Reviewed - No data to display Dg Shoulder Right  07/21/2012  *RADIOLOGY REPORT*  Clinical Data: Fall.  Shoulder pain.  RIGHT HUMERUS - 2+ VIEW,RIGHT SHOULDER - 2+ VIEW  Comparison: 09/06/2008 chest x-ray.  Findings:  Right shoulder three views:  Right humeral head/neck fracture with slight separation and angulation of fracture fragments.  The humeral head remains aligned with the glenoid.  Right humerus three views:  Right humeral head/neck fracture.  No other right humerus fracture is noted.  Limited imaging of the elbow without fracture identified.  IMPRESSION: Right humeral head/neck fracture as noted above.   Original Report Authenticated By: Lacy Duverney, M.D.    Dg Humerus Right  07/21/2012  *RADIOLOGY REPORT*  Clinical Data: Fall.  Shoulder pain.  RIGHT HUMERUS - 2+ VIEW,RIGHT SHOULDER - 2+ VIEW  Comparison: 09/06/2008 chest x-ray.  Findings:  Right shoulder three views:  Right humeral head/neck fracture with slight separation and angulation of fracture fragments.  The humeral head remains aligned with the glenoid.  Right humerus three views:  Right humeral head/neck fracture.  No other right humerus fracture is noted.  Limited imaging of the elbow without fracture identified.  IMPRESSION: Right humeral head/neck fracture as noted above.   Original Report Authenticated By: Lacy Duverney, M.D.      No diagnosis found.    MDM  X-ray right humerus shows proximal fracture.  Immobilization, pain medication, nausea medication, referral to hand surgeon Dr Teressa Senter      I personally performed the services described in this documentation, which was scribed in my presence. The recorded information has been reviewed and is accurate.    Lori Hutching, MD 07/21/12 [phone]

## 2012-07-21 NOTE — ED Notes (Signed)
Fall, pain rt upper arm, shoulder,  Alert, talking, No LOC

## 2012-08-28 ENCOUNTER — Ambulatory Visit (HOSPITAL_COMMUNITY)
Admission: RE | Admit: 2012-08-28 | Discharge: 2012-08-28 | Disposition: A | Discharge status: Home / Self Care | Payer: Medicare Other | Source: Ambulatory Visit | Attending: Orthopaedic Surgery | Admitting: Orthopaedic Surgery

## 2012-08-28 DIAGNOSIS — M6281 Muscle weakness (generalized): Secondary | ICD-10-CM | POA: Insufficient documentation

## 2012-08-28 DIAGNOSIS — M25619 Stiffness of unspecified shoulder, not elsewhere classified: Secondary | ICD-10-CM | POA: Insufficient documentation

## 2012-08-28 DIAGNOSIS — S42201A Unspecified fracture of upper end of right humerus, initial encounter for closed fracture: Secondary | ICD-10-CM | POA: Insufficient documentation

## 2012-08-28 DIAGNOSIS — M25519 Pain in unspecified shoulder: Secondary | ICD-10-CM | POA: Insufficient documentation

## 2012-08-28 DIAGNOSIS — IMO0001 Reserved for inherently not codable concepts without codable children: Secondary | ICD-10-CM | POA: Insufficient documentation

## 2012-08-28 NOTE — Evaluation (Signed)
Occupational Therapy Evaluation  Patient Details  Name: RASHEEN SCHEWE MRN: [id number] Date of Birth: 03/20/1946  Today's Date: 08/28/2012 Time: 4098-1191 OT Time Calculation (min): 42 min OT Evaluation 845-900 15' Manual Therapy 900-920 20' Visit#: 1  of 12   Re-eval: 09/25/12  Assessment Diagnosis: S/P Right Comminuted Fracture of Proximal Humerus Next MD Visit: Mid February  Authorization: [employer] [facility]  Authorization Time Period: before 10th visit  Authorization Visit#: 1  of 10    Past Medical History:  Past Medical History  Diagnosis Date  . Hypertension   . Hyperlipidemia   . GERD (gastroesophageal reflux disease)   . ASCVD (arteriosclerotic cardiovascular disease)    Past Surgical History:  Past Surgical History  Procedure Date  . Inner ear surgery 1966    Right  . Breast biopsy 1997    Left  . Tubal ligation 1970  . Total abdominal hysterectomy w/ bilateral salpingoophorectomy 05/2009    Subjective S:  I fell on December 2nd.  I really think I am moving my arm pretty good. Pertinent History: Mrs. Holleman fell in her yard, fracturing her right proximal humerus.   She presented to the ER and was treated and released.  She followed up with Dr. Cleophas Dunker.  She was wearing a sling, but this has been discharged.  She has been referred to occupational therapy for evaluation and treatment.   Limitations: 5.5 weeks post fracture this date.  Begin AAROM on 09/01/12. Special Tests: UEFI scored 41/80 = 50% Patient Stated Goals: "I want to get back like I was." Pain Assessment Currently in Pain?: Yes Pain Score:   3 Pain Location: Shoulder Pain Orientation: Right Pain Type: Acute pain  Precautions/Restrictions  Precautions Precautions: Shoulder  Prior Functioning  Home Living Lives With: Alone Prior Function Level of Independence: Independent with basic ADLs;Independent with homemaking with ambulation Driving: Yes Vocation: Retired Leisure:  Hobbies-yes (Comment) Comments: Mrs. Woolridge enjoys yard work, gardening, and working around her house.  Assessment ADL/Vision/Perception ADL ADL Comments: Mrs. Delatorre is having difficulty reaching overhead with her right arm.  She is not able to sweep or vacuum.   Dominant Hand: Right  Cognition/Observation Cognition Overall Cognitive Status: Appears within functional limits for tasks assessed  Sensation/Coordination/Edema Sensation Light Touch: Appears Intact Coordination Gross Motor Movements are Fluid and Coordinated: Yes Fine Motor Movements are Fluid and Coordinated: Yes  Additional Assessments RUE AROM (degrees) RUE Overall AROM Comments: Assessed in seated ER/IR with shoulder adducted Right Shoulder Flexion: 97 Degrees Right Shoulder ABduction: 85 Degrees Right Shoulder Internal Rotation: 90 Degrees Right Shoulder External Rotation: 65 Degrees RUE PROM (degrees) RUE Overall PROM Comments: Assessed in supine is 75% RUE Strength RUE Overall Strength Comments: Not assessed secondary to recent fracture.  Will assess when 6 weeks post injury Palpation Palpation: Moderate fascial restrictions in her right scapular and shoulder region     Exercise/Treatments    Manual Therapy Manual Therapy: Myofascial release Myofascial Release: MFR and manual stretching to right upper arm, scapular, and shoulder region to decrease pain and restrictions and increase pain free mobility in her right shoulder region.   Occupational Therapy Assessment and Plan OT Assessment and Plan Clinical Impression Statement: A:  Patient is a 67 year old female with decreased functional use of her RUE s/p right proximal humerus fracture.  She presents with decreased AROM and strength and increased pain and fascial restrictions in her right shoulder region, causing decreased I with her B/IADLs and leisure activities.  Pt will  benefit from skilled therapeutic intervention in order to improve on the following  deficits: Decreased range of motion;Increased fascial restricitons;Increased muscle spasms;Pain;Decreased strength Rehab Potential: Good OT Frequency: Min 2X/week OT Duration: 6 weeks OT Treatment/Interventions: Self-care/ADL training;Therapeutic exercise;Manual therapy;Therapeutic activities;Patient/family education;Modalities OT Plan: P:  Skilled OT intervention to decrease pain and restrictions and increase pain free mobility in her right shoulder region needed for return to prior level of I with all B/IADLs and leisure activities.  Treatment Plan:  MFR, manual stretching and PROM in supine.  Therapeutic Exercises:  AAROM in supine, seated ext, row, elev.  ball stretches, thumbtacks, prot/ret//elev/dep, progress as tolerated.     Goals Short Term Goals Time to Complete Short Term Goals: 3 weeks Short Term Goal 1: Patient will be educated on a HEP. Short Term Goal 2: Pateint will increase her right shoulder PROM to [facility] for increased ability to comb her hair. Short Term Goal 3: Patient will have 3+/5 strength in her right shoulder in order to resume sweeping and vacuuming activities.  Short Term Goal 4: Patient will decrease pain to 2/10 in her right arm when reaching into overhead cabinet. Short Term Goal 5: Patient will decrease fascial restrictions from mod to min-mod for increased mobility in her right shoulder during functional activities.  Long Term Goals Time to Complete Long Term Goals: 6 weeks Long Term Goal 1: Patient will resume her prior level of I with all B/IADL and leisure activities as demonstrated by improving her score on the UEFI to 90% or better.  Long Term Goal 2: Patient will increase her right shoulder AROM to WNL for increased ability to reach overhead when putting dishes away. Long Term Goal 3: Patient will increase her right shoulder strength to 5/5 for increased ability to work in her yard. Long Term Goal 4: Patient will decrease her pain level in her right shoulder to  1/10 or better when she is completing her daily activities.  Long Term Goal 5: Patient will decrease her fascial restrictions to trace in her right shoulder for increased mobility with daily activities.   Problem List Patient Active Problem List  Diagnosis  . ATHEROSCLEROTIC CARDIOVASCULAR DISEASE  . GASTROESOPHAGEAL REFLUX DISEASE  . Fracture of humerus, proximal, right, closed  . Pain in joint, shoulder region  . Muscle weakness (generalized)    End of Session Activity Tolerance: Patient tolerated treatment well General Behavior During Session: [facility] for tasks performed Cognition: [facility] for tasks performed OT Plan of Care OT Home Exercise Plan: Educated on HEP for AAROM in supine and towel slides.    GO Functional Assessment Tool Used: UEFI scored 41/80 = 50% I level, 50% impairment level Functional Limitation: Carrying, moving and handling objects Carrying, Moving and Handling Objects Current Status (Z6109): At least 40 percent but less than 60 percent impaired, limited or restricted Carrying, Moving and Handling Objects Goal Status [phone]): At least 1 percent but less than 20 percent impaired, limited or restricted  Shirlean Mylar, OTR/L  08/28/2012, 1:36 PM  Physician Documentation Your signature is required to indicate approval of the treatment plan as stated above.  Please sign and either send electronically or make a copy of this report for your files and return this physician signed original.  Please mark one 1.__approve of plan  2. ___approve of plan with the following conditions.   ______________________________  _____________________ Physician Signature                                                                                                             Date

## 2012-09-02 ENCOUNTER — Ambulatory Visit (HOSPITAL_COMMUNITY): Payer: Medicare Other | Admitting: Occupational Therapy

## 2012-09-04 ENCOUNTER — Inpatient Hospital Stay (HOSPITAL_COMMUNITY): Admission: RE | Admit: 2012-09-04 | Payer: Medicare Other | Source: Ambulatory Visit

## 2012-09-09 ENCOUNTER — Ambulatory Visit (HOSPITAL_COMMUNITY)
Admission: RE | Admit: 2012-09-09 | Discharge: 2012-09-09 | Disposition: A | Discharge status: Home / Self Care | Payer: Medicare Other | Source: Ambulatory Visit | Attending: Orthopaedic Surgery | Admitting: Orthopaedic Surgery

## 2012-09-09 DIAGNOSIS — M6281 Muscle weakness (generalized): Secondary | ICD-10-CM

## 2012-09-09 DIAGNOSIS — M25519 Pain in unspecified shoulder: Secondary | ICD-10-CM

## 2012-09-09 DIAGNOSIS — S42201A Unspecified fracture of upper end of right humerus, initial encounter for closed fracture: Secondary | ICD-10-CM

## 2012-09-09 NOTE — Progress Notes (Signed)
Occupational Therapy Treatment Patient Details  Name: Lori Bell MRN: [id number] Date of Birth: May 01, 1946  Today's Date: 09/09/2012 Time: 1020-1100 OT Time Calculation (min): 40 min Massage 1020-1035 15' Therex 1035-1100 25'  Visit#: 2  of 12   Re-eval: 09/25/12    Authorization: Valinda Ho[employer] [facility] Medicare  Authorization Time Period: before 10th visit  Authorization Visit#: 2  of 10   Subjective Symptoms/Limitations Symptoms: S: I've been doing the exercises at home and my arm is moving really good. Pain Assessment Currently in Pain?: Yes Pain Score:   1 Pain Location: Shoulder Pain Orientation: Right Pain Type: Acute pain  Precautions/Restrictions  Precautions Precautions: Shoulder  Exercise/Treatments Supine Protraction: AAROM;20 reps Horizontal ABduction: AAROM;20 reps External Rotation: AAROM;20 reps Internal Rotation: AAROM;20 reps Flexion: AAROM;20 reps ABduction: AAROM;20 reps ROM / Strengthening / Isometric Strengthening Wall Wash: 1' Thumb Tacks: 1' Prot/Ret//Elev/Dep: 2'      Manual Therapy Manual Therapy: Massage Massage: massage and manual stretching to right upper arm, scapular, and shoulder region to decrease pain and restrictions and increase pain free mobility in her right shoulder region.   Occupational Therapy Assessment and Plan OT Assessment and Plan Clinical Impression Statement: A: Patient tolerated exercises well. AAROM seemed too easy even with increased reps. Needed increase vc's for elev/dep/pro/retra exercises. OT Plan: P: Add ball on the wall, UBE, and possibly 1# for UB exercises.   Goals Short Term Goals Time to Complete Short Term Goals: 3 weeks Short Term Goal 1: Patient will be educated on a HEP. Short Term Goal 1 Progress: Progressing toward goal Short Term Goal 2: Pateint will increase her right shoulder PROM to [facility] for increased ability to comb her hair. Short Term Goal 2 Progress: Progressing toward goal Short  Term Goal 3: Patient will have 3+/5 strength in her right shoulder in order to resume sweeping and vacuuming activities.  Short Term Goal 3 Progress: Progressing toward goal Short Term Goal 4: Patient will decrease pain to 2/10 in her right arm when reaching into overhead cabinet. Short Term Goal 4 Progress: Progressing toward goal Short Term Goal 5: Patient will decrease fascial restrictions from mod to min-mod for increased mobility in her right shoulder during functional activities.  Short Term Goal 5 Progress: Progressing toward goal Long Term Goals Time to Complete Long Term Goals: 6 weeks Long Term Goal 1: Patient will resume her prior level of I with all B/IADL and leisure activities as demonstrated by improving her score on the UEFI to 90% or better.  Long Term Goal 1 Progress: Progressing toward goal Long Term Goal 2: Patient will increase her right shoulder AROM to WNL for increased ability to reach overhead when putting dishes away. Long Term Goal 2 Progress: Progressing toward goal Long Term Goal 3: Patient will increase her right shoulder strength to 5/5 for increased ability to work in her yard. Long Term Goal 3 Progress: Progressing toward goal Long Term Goal 4: Patient will decrease her pain level in her right shoulder to 1/10 or better when she is completing her daily activities.  Long Term Goal 4 Progress: Progressing toward goal Long Term Goal 5: Patient will decrease her fascial restrictions to trace in her right shoulder for increased mobility with daily activities.  Long Term Goal 5 Progress: Progressing toward goal  Problem List Patient Active Problem List  Diagnosis  . ATHEROSCLEROTIC CARDIOVASCULAR DISEASE  . GASTROESOPHAGEAL REFLUX DISEASE  . Fracture of humerus, proximal, right, closed  . Pain in joint, shoulder region  .  Muscle weakness (generalized)    End of Session Activity Tolerance: Patient tolerated treatment well General Behavior During Session: [name]  for tasks performed Cognition: [facility] for tasks performed   Limmie Patricia, OTR/L 09/09/2012, 11:03 AM

## 2012-09-11 ENCOUNTER — Ambulatory Visit (HOSPITAL_COMMUNITY): Payer: Medicare Other | Admitting: Specialist

## 2012-09-16 ENCOUNTER — Ambulatory Visit (HOSPITAL_COMMUNITY): Payer: Medicare Other

## 2012-09-18 ENCOUNTER — Ambulatory Visit (HOSPITAL_COMMUNITY): Payer: Medicare Other | Admitting: Specialist

## 2012-10-09 ENCOUNTER — Encounter: Payer: Self-pay | Admitting: Pulmonary Disease

## 2013-01-07 ENCOUNTER — Other Ambulatory Visit (HOSPITAL_COMMUNITY): Payer: Self-pay | Admitting: Pulmonary Disease

## 2013-01-07 DIAGNOSIS — Z139 Encounter for screening, unspecified: Secondary | ICD-10-CM

## 2013-01-15 ENCOUNTER — Ambulatory Visit (HOSPITAL_COMMUNITY)
Admission: RE | Admit: 2013-01-15 | Discharge: 2013-01-15 | Disposition: A | Discharge status: Home / Self Care | Payer: Medicare Other | Source: Ambulatory Visit | Attending: Pulmonary Disease | Admitting: Pulmonary Disease

## 2013-01-15 DIAGNOSIS — Z1231 Encounter for screening mammogram for malignant neoplasm of breast: Secondary | ICD-10-CM | POA: Insufficient documentation

## 2013-01-15 DIAGNOSIS — Z139 Encounter for screening, unspecified: Secondary | ICD-10-CM

## 2013-07-21 ENCOUNTER — Emergency Department (HOSPITAL_COMMUNITY)
Admission: EM | Admit: 2013-07-21 | Discharge: 2013-07-21 | Disposition: A | Discharge status: Home / Self Care | Payer: Medicare Other | Attending: Emergency Medicine | Admitting: Emergency Medicine

## 2013-07-21 ENCOUNTER — Encounter (HOSPITAL_COMMUNITY): Payer: Self-pay | Admitting: Emergency Medicine

## 2013-07-21 DIAGNOSIS — Y929 Unspecified place or not applicable: Secondary | ICD-10-CM | POA: Insufficient documentation

## 2013-07-21 DIAGNOSIS — Z8719 Personal history of other diseases of the digestive system: Secondary | ICD-10-CM | POA: Insufficient documentation

## 2013-07-21 DIAGNOSIS — W57XXXA Bitten or stung by nonvenomous insect and other nonvenomous arthropods, initial encounter: Secondary | ICD-10-CM | POA: Insufficient documentation

## 2013-07-21 DIAGNOSIS — I10 Essential (primary) hypertension: Secondary | ICD-10-CM | POA: Insufficient documentation

## 2013-07-21 DIAGNOSIS — R5381 Other malaise: Secondary | ICD-10-CM | POA: Insufficient documentation

## 2013-07-21 DIAGNOSIS — Z7982 Long term (current) use of aspirin: Secondary | ICD-10-CM | POA: Insufficient documentation

## 2013-07-21 DIAGNOSIS — S40269A Insect bite (nonvenomous) of unspecified shoulder, initial encounter: Secondary | ICD-10-CM | POA: Insufficient documentation

## 2013-07-21 DIAGNOSIS — Z79899 Other long term (current) drug therapy: Secondary | ICD-10-CM | POA: Insufficient documentation

## 2013-07-21 DIAGNOSIS — E876 Hypokalemia: Secondary | ICD-10-CM

## 2013-07-21 DIAGNOSIS — Y9389 Activity, other specified: Secondary | ICD-10-CM | POA: Insufficient documentation

## 2013-07-21 DIAGNOSIS — E785 Hyperlipidemia, unspecified: Secondary | ICD-10-CM | POA: Insufficient documentation

## 2013-07-21 DIAGNOSIS — IMO0001 Reserved for inherently not codable concepts without codable children: Secondary | ICD-10-CM | POA: Insufficient documentation

## 2013-07-21 DIAGNOSIS — M2550 Pain in unspecified joint: Secondary | ICD-10-CM | POA: Insufficient documentation

## 2013-07-21 LAB — BASIC METABOLIC PANEL
BUN: 16 mg/dL (ref 6–23)
CO2: 25 mEq/L (ref 19–32)
Calcium: 9.9 mg/dL (ref 8.4–10.5)
Chloride: 95 mEq/L — ABNORMAL LOW (ref 96–112)
Creatinine, Ser: 0.83 mg/dL (ref 0.50–1.10)
GFR calc Af Amer: 82 mL/min — ABNORMAL LOW (ref >90)
GFR calc non Af Amer: 71 mL/min — ABNORMAL LOW (ref >90)
Glucose, Bld: 112 mg/dL — ABNORMAL HIGH (ref 70–99)
Potassium: 2.8 mEq/L — ABNORMAL LOW (ref 3.5–5.1)
Sodium: 135 mEq/L (ref 135–145)

## 2013-07-21 LAB — URINALYSIS, ROUTINE W REFLEX MICROSCOPIC
Bilirubin Urine: NEGATIVE
Glucose, UA: NEGATIVE mg/dL
Ketones, ur: NEGATIVE mg/dL
Leukocytes, UA: NEGATIVE
Nitrite: NEGATIVE
Protein, ur: NEGATIVE mg/dL
Specific Gravity, Urine: 1.01 (ref 1.005–1.030)
Urobilinogen, UA: 0.2 mg/dL (ref 0.0–1.0)
pH: 5.5 (ref 5.0–8.0)

## 2013-07-21 LAB — CBC
HCT: 35.6 % — ABNORMAL LOW (ref 36.0–46.0)
Hemoglobin: 12.3 g/dL (ref 12.0–15.0)
MCH: 30 pg (ref 26.0–34.0)
MCHC: 34.6 g/dL (ref 30.0–36.0)
MCV: 86.8 fL (ref 78.0–100.0)
Platelets: 211 10*3/uL (ref 150–400)
RBC: 4.1 MIL/uL (ref 3.87–5.11)
RDW: 12.5 % (ref 11.5–15.5)
WBC: 5.1 10*3/uL (ref 4.0–10.5)

## 2013-07-21 LAB — TROPONIN I: Troponin I: <0.3 ng/mL (ref <0.30)

## 2013-07-21 LAB — URINE MICROSCOPIC-ADD ON

## 2013-07-21 MED ORDER — DOXYCYCLINE HYCLATE 100 MG PO CAPS: 100.0000 mg | ORAL_CAPSULE | Freq: Two times a day (BID) | ORAL | Status: AC

## 2013-07-21 MED ORDER — POTASSIUM CHLORIDE CRYS ER 20 MEQ PO TBCR
40.0000 meq | EXTENDED_RELEASE_TABLET | Freq: Once | ORAL | Status: AC
  Administered 2013-07-21: 40 meq via ORAL
  Filled 2013-07-21: qty 2

## 2013-07-21 MED ORDER — DOXYCYCLINE HYCLATE 100 MG PO TABS
100.0000 mg | ORAL_TABLET | Freq: Once | ORAL | Status: AC
  Administered 2013-07-21: 100 mg via ORAL
  Filled 2013-07-21: qty 1

## 2013-07-21 MED ORDER — ONDANSETRON HCL 4 MG PO TABS
4.0000 mg | ORAL_TABLET | Freq: Once | ORAL | Status: AC
  Administered 2013-07-21: 4 mg via ORAL
  Filled 2013-07-21: qty 1

## 2013-07-21 MED ORDER — POTASSIUM CHLORIDE ER 10 MEQ PO TBCR: 10.0000 meq | EXTENDED_RELEASE_TABLET | Freq: Every day | ORAL | Status: DC

## 2013-07-21 NOTE — ED Provider Notes (Signed)
CSN: [id number]     Arrival date & time 07/21/13  2104 History   First MD Initiated Contact with Patient 07/21/13 2129     Chief Complaint  Patient presents with  . Tick Removal   (Consider location/radiation/quality/duration/timing/severity/associated sxs/prior Treatment) HPI Comments: Patient is a 67 year old female who presents to the emergency department with a complaint of a tick bite to the left shoulder, fatigue, and aching joints. The patient and her daughter state that they were raking leaves approximately 7-10 days ago, and they think that may be the time when the patient received a tick bite. They noticed a tick on yesterday, and removed the tick successfully. However the patient has been feeling fatigued for the past week or better and having some increased aching in the joints. The patient states that at times if she is overly active around her home but she feels a little bit winded. There's been no high fever. There's been no reported rash. It is of note that the patient is on a antihypertensive that has a diuretic in it.  There's been no vomiting or diarrhea. There's been no unusual shortness of breath or sweating. There's been no loss of consciousness reported.  The history is provided by the patient.    Past Medical History  Diagnosis Date  . Hypertension   . Hyperlipidemia   . GERD (gastroesophageal reflux disease)   . ASCVD (arteriosclerotic cardiovascular disease)    Past Surgical History  Procedure Laterality Date  . Inner ear surgery  1966    Right  . Breast biopsy  1997    Left  . Tubal ligation  1970  . Total abdominal hysterectomy w/ bilateral salpingoophorectomy  05/2009   Family History  Problem Relation Age of Onset  . Coronary artery disease Other    History  Substance Use Topics  . Smoking status: Unknown If Ever Smoked  . Smokeless tobacco: Not on file  . Alcohol Use: No   OB History   Grav Para Term Preterm Abortions TAB SAB Ect Mult Living                 Review of Systems  Constitutional: Positive for fatigue. Negative for fever and activity change.       All ROS Neg except as noted in HPI  HENT: Negative for nosebleeds.   Eyes: Negative for photophobia and discharge.  Respiratory: Negative for cough, shortness of breath and wheezing.   Cardiovascular: Negative for chest pain and palpitations.  Gastrointestinal: Negative for abdominal pain and blood in stool.  Genitourinary: Negative for dysuria, frequency and hematuria.  Musculoskeletal: Positive for arthralgias and myalgias. Negative for back pain and neck pain.  Skin: Negative.   Neurological: Negative for dizziness, seizures and speech difficulty.  Psychiatric/Behavioral: Negative for hallucinations and confusion.    Allergies  Review of patient's allergies indicates no known allergies.  Home Medications   Current Outpatient Rx  Name  Route  Sig  Dispense  Refill  . aspirin 81 MG tablet   Oral   Take 81 mg by mouth daily.          Marland Kitchen desvenlafaxine (PRISTIQ) 50 MG 24 hr tablet   Oral   Take 50 mg by mouth daily.         . fish oil-omega-3 fatty acids 1000 MG capsule   Oral   Take 1 capsule by mouth daily.           Marland Kitchen oxyCODONE-acetaminophen (PERCOCET/ROXICET) 5-325 MG per tablet  Oral   Take 1-2 tablets by mouth every 6 (six) hours as needed for pain.   30 tablet   0   . rosuvastatin (CRESTOR) 20 MG tablet   Oral   Take 20 mg by mouth daily.         . valsartan-hydrochlorothiazide (DIOVAN-HCT) 160-12.5 MG per tablet   Oral   Take 1 tablet by mouth daily.          BP 149/77  Pulse 70  Temp(Src) 98.2 F (36.8 C) (Oral)  Resp 18  Ht 5\' 5"  (1.651 m)  Wt 145 lb (65.772 kg)  BMI 24.13 kg/m2  SpO2 100% Physical Exam  Nursing note and vitals reviewed. Constitutional: She is oriented to person, place, and time. She appears well-developed and well-nourished.  Non-toxic appearance.  HENT:  Head: Normocephalic.  Right Ear: Tympanic  membrane and external ear normal.  Left Ear: Tympanic membrane and external ear normal.  Eyes: EOM and lids are normal. Pupils are equal, round, and reactive to light.  Neck: Normal range of motion. Neck supple. Carotid bruit is not present.  Cardiovascular: Normal rate, regular rhythm, normal heart sounds, intact distal pulses and normal pulses.   Pulmonary/Chest: Breath sounds normal. No respiratory distress.  Abdominal: Soft. Bowel sounds are normal. There is no tenderness. There is no guarding.  Musculoskeletal: Normal range of motion.  There is a small denuded area on the left arm where is a tick has been removed. Examination under magnification reveals no residual parts of the tick. There is no red streaking and there is no drainage from the denuded area.  Lymphadenopathy:       Head (right side): No submandibular adenopathy present.       Head (left side): No submandibular adenopathy present.    She has no cervical adenopathy.  Neurological: She is alert and oriented to person, place, and time. She has normal strength. No cranial nerve deficit or sensory deficit.  Skin: Skin is warm and dry.  Psychiatric: She has a normal mood and affect. Her speech is normal.    ED Course  Procedures (including critical care time) Labs Review Labs Reviewed - No data to display Imaging Review No results found.  EKG Interpretation   None       MDM  No diagnosis found. *I have reviewed nursing notes, vital signs, and all appropriate lab and imaging results for this patient.**  Patient has removed a tick from the left arm. For approximately a week she has been feeling tired, fatigued, and having body aches. Patient denies chest pain, but states that at times she does feel like she is a little more easily winded than usual when doing activities around the house.  Complete blood count is well within normal limits. Troponin is negative for acute event. Basic metabolic panel reveals the potassium  to be low at 2.8, chloride low at 95, otherwise essentially within normal limits. Urine analysis is completely normal. Electrocardiogram shows normal sinus rhythm without evidence of acute event.  The results have been discussed with the patient in terms which he understands. Questions were answered. The plan at this time is for the patient to be on potassium 10 mEq daily. Patient is asked to increase foods high in potassium. Patient is advised to see her primary care physician in 7-10 days for recheck of the potassium. A [place] spotted fever titer has been obtained. Pt and family advised that results would be given by the [facility] office. Pt places on  Doxycycline 100mg .  Kathie Dike, PA-C 07/23/13 [phone]

## 2013-07-21 NOTE — ED Notes (Signed)
Pt reports tick bite on left shoulder.  Reports aching joints and fatigue for past few weeks.

## 2013-07-24 LAB — ROCKY MTN SPOTTED FVR AB, IGG-BLOOD: RMSF IgG: 0.47 IV

## 2013-07-31 ENCOUNTER — Encounter: Payer: Self-pay | Admitting: Pulmonary Disease

## 2013-08-03 NOTE — ED Provider Notes (Signed)
Medical screening examination/treatment/procedure(s) were conducted as a shared visit with non-physician practitioner(s) and myself.  I personally evaluated the patient during the encounter.  EKG Interpretation    Date/Time:  Tuesday July 21 2013 22:07:09 EST Ventricular Rate:  67 PR Interval:  146 QRS Duration: 86 QT Interval:  430 QTC Calculation: 454 R Axis:   21 Text Interpretation:  Normal sinus rhythm Normal ECG When compared with ECG of 17-Jun-2009 09:45, No significant change was found Confirmed by Jerek Meulemans  MD, Daekwon Beswick ([id number]) on 07/21/2013 11:04:53 PM           Patient is in no acute distress. Will cover for RMSF with doxycycline. Rx potassium replacement. EKG and troponin negative  Donnetta Hutching, MD 08/03/13 [phone]

## 2013-09-01 ENCOUNTER — Other Ambulatory Visit (HOSPITAL_COMMUNITY): Payer: Self-pay | Admitting: Pulmonary Disease

## 2013-09-01 DIAGNOSIS — Z139 Encounter for screening, unspecified: Secondary | ICD-10-CM

## 2013-09-08 ENCOUNTER — Encounter: Payer: Self-pay | Admitting: Pulmonary Disease

## 2013-12-04 ENCOUNTER — Encounter: Payer: Self-pay | Admitting: Pulmonary Disease

## 2013-12-08 ENCOUNTER — Other Ambulatory Visit (HOSPITAL_COMMUNITY): Payer: Self-pay | Admitting: Pulmonary Disease

## 2013-12-08 DIAGNOSIS — M25512 Pain in left shoulder: Secondary | ICD-10-CM

## 2013-12-08 DIAGNOSIS — Z780 Asymptomatic menopausal state: Secondary | ICD-10-CM

## 2013-12-10 ENCOUNTER — Ambulatory Visit (HOSPITAL_COMMUNITY)
Admission: RE | Admit: 2013-12-10 | Discharge: 2013-12-10 | Disposition: A | Discharge status: Home / Self Care | Payer: Medicare Other | Source: Ambulatory Visit | Attending: Pulmonary Disease | Admitting: Pulmonary Disease

## 2013-12-10 DIAGNOSIS — M25519 Pain in unspecified shoulder: Secondary | ICD-10-CM | POA: Insufficient documentation

## 2013-12-10 DIAGNOSIS — N959 Unspecified menopausal and perimenopausal disorder: Secondary | ICD-10-CM | POA: Insufficient documentation

## 2013-12-10 DIAGNOSIS — M25512 Pain in left shoulder: Secondary | ICD-10-CM

## 2013-12-10 DIAGNOSIS — Z780 Asymptomatic menopausal state: Secondary | ICD-10-CM

## 2014-01-12 ENCOUNTER — Ambulatory Visit (HOSPITAL_COMMUNITY)
Admission: RE | Admit: 2014-01-12 | Discharge: 2014-01-12 | Disposition: A | Discharge status: Home / Self Care | Payer: Medicare Other | Source: Ambulatory Visit | Attending: Pulmonary Disease | Admitting: Pulmonary Disease

## 2014-01-12 DIAGNOSIS — Z139 Encounter for screening, unspecified: Secondary | ICD-10-CM

## 2014-01-12 DIAGNOSIS — Z1231 Encounter for screening mammogram for malignant neoplasm of breast: Secondary | ICD-10-CM | POA: Insufficient documentation

## 2014-04-15 ENCOUNTER — Ambulatory Visit (HOSPITAL_COMMUNITY)
Admission: RE | Admit: 2014-04-15 | Discharge: 2014-04-15 | Disposition: A | Discharge status: Home / Self Care | Payer: Medicare Other | Source: Ambulatory Visit | Attending: Pulmonary Disease | Admitting: Pulmonary Disease

## 2014-04-15 DIAGNOSIS — R072 Precordial pain: Secondary | ICD-10-CM | POA: Diagnosis present

## 2014-04-15 DIAGNOSIS — E785 Hyperlipidemia, unspecified: Secondary | ICD-10-CM | POA: Diagnosis not present

## 2014-04-15 DIAGNOSIS — I519 Heart disease, unspecified: Secondary | ICD-10-CM | POA: Diagnosis not present

## 2014-04-15 DIAGNOSIS — I10 Essential (primary) hypertension: Secondary | ICD-10-CM | POA: Insufficient documentation

## 2014-04-15 DIAGNOSIS — K219 Gastro-esophageal reflux disease without esophagitis: Secondary | ICD-10-CM | POA: Diagnosis not present

## 2014-04-15 DIAGNOSIS — I2510 Atherosclerotic heart disease of native coronary artery without angina pectoris: Secondary | ICD-10-CM | POA: Insufficient documentation

## 2014-04-15 DIAGNOSIS — I369 Nonrheumatic tricuspid valve disorder, unspecified: Secondary | ICD-10-CM

## 2014-04-15 NOTE — Progress Notes (Signed)
  Echocardiogram 2D Echocardiogram has been performed.  Lori Bell 04/15/2014, 10:11 AM

## 2014-04-29 ENCOUNTER — Ambulatory Visit: Payer: Medicare Other | Admitting: Cardiology

## 2014-04-30 ENCOUNTER — Encounter: Payer: Self-pay | Admitting: Cardiology

## 2014-06-08 ENCOUNTER — Encounter: Payer: Self-pay | Admitting: Pulmonary Disease

## 2014-06-08 LAB — VITAMIN B12: Vitamin B-12: 315

## 2014-06-16 ENCOUNTER — Other Ambulatory Visit (HOSPITAL_COMMUNITY): Payer: Self-pay | Admitting: Pulmonary Disease

## 2014-06-16 DIAGNOSIS — R413 Other amnesia: Secondary | ICD-10-CM

## 2014-06-18 ENCOUNTER — Ambulatory Visit (HOSPITAL_COMMUNITY)
Admission: RE | Admit: 2014-06-18 | Discharge: 2014-06-18 | Disposition: A | Discharge status: Home / Self Care | Payer: Medicare Other | Source: Ambulatory Visit | Attending: Pulmonary Disease | Admitting: Pulmonary Disease

## 2014-06-18 ENCOUNTER — Ambulatory Visit (HOSPITAL_COMMUNITY): Payer: Medicare Other

## 2014-06-18 ENCOUNTER — Encounter (HOSPITAL_COMMUNITY): Payer: Self-pay

## 2014-06-18 DIAGNOSIS — R413 Other amnesia: Secondary | ICD-10-CM | POA: Diagnosis present

## 2014-06-18 DIAGNOSIS — G319 Degenerative disease of nervous system, unspecified: Secondary | ICD-10-CM | POA: Diagnosis not present

## 2014-12-11 ENCOUNTER — Other Ambulatory Visit (HOSPITAL_COMMUNITY): Payer: Self-pay | Admitting: Pulmonary Disease

## 2014-12-11 DIAGNOSIS — Z1231 Encounter for screening mammogram for malignant neoplasm of breast: Secondary | ICD-10-CM

## 2015-01-24 ENCOUNTER — Ambulatory Visit (HOSPITAL_COMMUNITY): Payer: Self-pay

## 2015-02-25 ENCOUNTER — Ambulatory Visit (HOSPITAL_COMMUNITY): Payer: Self-pay

## 2015-03-04 ENCOUNTER — Ambulatory Visit (HOSPITAL_COMMUNITY)
Admission: RE | Admit: 2015-03-04 | Discharge: 2015-03-04 | Disposition: A | Discharge status: Home / Self Care | Payer: Medicare Other | Source: Ambulatory Visit | Attending: Pulmonary Disease | Admitting: Pulmonary Disease

## 2015-03-04 DIAGNOSIS — Z1231 Encounter for screening mammogram for malignant neoplasm of breast: Secondary | ICD-10-CM | POA: Insufficient documentation

## 2015-03-16 ENCOUNTER — Ambulatory Visit: Payer: Medicare Other | Admitting: Neurology

## 2015-04-07 ENCOUNTER — Ambulatory Visit (INDEPENDENT_AMBULATORY_CARE_PROVIDER_SITE_OTHER): Payer: Medicare Other | Admitting: Neurology

## 2015-04-07 ENCOUNTER — Encounter: Payer: Self-pay | Admitting: Neurology

## 2015-04-07 VITALS — BP 177/95 | HR 67 | Ht 65.0 in | Wt 151.4 lb

## 2015-04-07 DIAGNOSIS — G250 Essential tremor: Secondary | ICD-10-CM

## 2015-04-07 DIAGNOSIS — G20 Parkinson's disease: Secondary | ICD-10-CM | POA: Diagnosis not present

## 2015-04-07 DIAGNOSIS — R251 Tremor, unspecified: Secondary | ICD-10-CM | POA: Diagnosis not present

## 2015-04-07 DIAGNOSIS — R413 Other amnesia: Secondary | ICD-10-CM

## 2015-04-07 MED ORDER — PRIMIDONE 50 MG PO TABS: ORAL_TABLET | ORAL | Status: DC

## 2015-04-07 NOTE — Progress Notes (Signed)
[facility]    Provider:  Dr Lucia Gaskins Referring Provider: Kari Baars, MD Primary Care Physician:  Lori Maudlin, MD  CC:  Tremor  HPI:  Lori Bell is a 69 y.o. female here as a referral from Dr. Juanetta Bell for evaluation of parkinson's. Past medical history of anxiety, depression, hyperlipidemia, hypertension, tremor, dementia without behavioral disturbance. Her head won't stop wobbling. Started just in July of this year. The daughters never noticed it before then. It is even worse than it is today sometimes. Primidone helped. The primidone makes her tired. She got used to it. Hands are not really shaking but she does endorse tremor in the hands as well just not as bad.No shhuffling. Voice has not gotten any lower. No drooling or wet pillows.  No loss of smell or taste. No difficulty swallowing. No new mood changes. No hallucinations. She has some memory loss but no demention, mild cognitive aging. No falls. Her father had parkinson's disease. No other family members with tremor. No new medications.   Reviewed notes, labs and imaging from outside physicians, which showed: She takes primidone 50 mg twice a day. He was started in July 2016. Personally reviewed CT of the head from October 2015 which showed some mild atrophy but otherwise unremarkable.  Review of Systems: Patient complains of symptoms per HPI as well as the following symptoms: Memory loss, depression. Pertinent negatives per HPI. All others negative.   Social History   Social History  . Marital Status: Widowed    Spouse Name: N/A  . Number of Children: 4  . Years of Education: 9   Occupational History  . Retired     Social History Main Topics  . Smoking status: Never Smoker   . Smokeless tobacco: Not on file  . Alcohol Use: No  . Drug Use: No  . Sexual Activity: Not on file   Other Topics Concern  . Not on file   Social History Narrative   Lives at home by self.   Caffeine use: 1 cup  coffee/day    Exercises regularly    Family History  Problem Relation Age of Onset  . Coronary artery disease Other   . Cancer Brother   . Dementia Brother   . Dementia Brother   . Dementia Sister     Past Medical History  Diagnosis Date  . Hypertension   . Hyperlipidemia   . GERD (gastroesophageal reflux disease)   . ASCVD (arteriosclerotic cardiovascular disease)   . Depression     Past Surgical History  Procedure Laterality Date  . Inner ear surgery  1966    Right  . Breast biopsy  1997    Left  . Tubal ligation  1970  . Total abdominal hysterectomy w/ bilateral salpingoophorectomy  05/2009    Current Outpatient Prescriptions  Medication Sig Dispense Refill  . aspirin 81 MG tablet Take 81 mg by mouth at bedtime.     . Calcium Citrate-Vitamin D (CALCIUM CITRATE + PO) Take 500 mg by mouth daily.    Marland Kitchen desvenlafaxine (PRISTIQ) 50 MG 24 hr tablet Take 50 mg by mouth at bedtime.     . pravastatin (PRAVACHOL) 40 MG tablet Take 40 mg by mouth daily.    . primidone (MYSOLINE) 50 MG tablet Take 50 mg by mouth 2 (two) times daily.    . valsartan-hydrochlorothiazide (DIOVAN-HCT) 160-12.5 MG per tablet Take 1 tablet by mouth at bedtime. 150-12.5 mg (04/07/15)     No current facility-administered medications for this  visit.    Allergies as of 04/07/2015  . (No Known Allergies)    Vitals: BP 177/95 mmHg  Pulse 67  Ht 5\' 5"  (1.651 m)  Wt 151 lb 6.4 oz (68.675 kg)  BMI 25.19 kg/m2 Last Weight:  Wt Readings from Last 1 Encounters:  04/07/15 151 lb 6.4 oz (68.675 kg)   Last Height:   Ht Readings from Last 1 Encounters:  04/07/15 5\' 5"  (1.651 m)    Physical exam: Exam: Gen: NAD, conversant, well groomed                     CV: RRR, no MRG. No Carotid Bruits. No peripheral edema, warm, nontender Eyes: Conjunctivae clear without exudates or hemorrhage  Neuro: Detailed Neurologic Exam  Speech:    Speech is normal; fluent and spontaneous with normal comprehension.    Cognition:    The patient is oriented to person, place, and time;     recent and remote memory appear intact;     language fluent;     normal attention, concentration,     fund of knowledge - knows clinton and trump are running for president Cranial Nerves:    The pupils are equal, round, and reactive to light. Attempted fundoscopic exam unable to visualize. Visual fields are full to finger confrontation. Extraocular movements are intact. Trigeminal sensation is intact and the muscles of mastication are normal. The face is symmetric. The palate elevates in the midline. Hearing intact to voice. Voice is normal. Shoulder shrug is normal. The tongue has normal motion without fasciculations.   Coordination:    Normal finger to nose and heel to shin. Normal rapid alternating movements.   Gait:    Heel-toe and tandem gait are normal.   Motor Observation:    No-no head tremor, mild postural tremor with slight kinetic component Tone:    Normal muscle tone.    Posture:    Posture is normal. normal erect    Strength:    Strength is V/V in the upper and lower limbs.      Sensation: intact to LT     Reflex Exam:  DTR's:    Deep tendon reflexes in the upper and lower extremities are symmetrical bilaterally.   Toes:    The toes are downgoing bilaterally.   Clonus:    Clonus is absent.      Assessment/Plan:  69 year old female with likely essential tremor. Improved with primidone. We'll increase dose. We'll also evaluate with thyroid and heavy metals labs. We'll order an MRI of the brain.   Naomie Dean, MD  [facility] [address] [place], [place] [address]  Phone [phone] Fax [phone]

## 2015-04-07 NOTE — Patient Instructions (Addendum)
Overall you are doing fairly well but I do want to suggest a few things today:   Remember to drink plenty of fluid, eat healthy meals and do not skip any meals. Try to eat protein with a every meal and eat a healthy snack such as fruit or nuts in between meals. Try to keep a regular sleep-wake schedule and try to exercise daily, particularly in the form of walking, 20-30 minutes a day, if you can.   As far as your medications are concerned, I would like to suggest:  Primidone, Take  in the morning and  at night  As far as diagnostic testing: MR of the brain and labs  I would like to see you back in 3 months, sooner if we need to. Please call us with any interim questions, concerns, problems, updates or refill requests.   Please also call us for any test results so we can go over those with you on the phone.  My clinical assistant and will answer any of your questions and relay your messages to me and also relay most of my messages to you.   Our phone number is [phone]. We also have an after hours call service for urgent matters and there is a physician on-call for urgent questions. For any emergencies you know to call 911 or go to the nearest emergency room

## 2015-04-09 LAB — THYROID PANEL WITH TSH
Free Thyroxine Index: 2.2 (ref 1.2–4.9)
T3 Uptake Ratio: 26 % (ref 24–39)
T4, Total: 8.5 ug/dL (ref 4.5–12.0)
TSH: 5.5 u[IU]/mL — ABNORMAL HIGH (ref 0.450–4.500)

## 2015-04-09 LAB — HEAVY METALS, BLOOD
Arsenic: 4 ug/L (ref 2–23)
Lead, Blood: 2 ug/dL (ref 0–19)
Mercury: NOT DETECTED ug/L (ref 0.0–14.9)

## 2015-04-10 ENCOUNTER — Encounter: Payer: Self-pay | Admitting: Neurology

## 2015-04-10 DIAGNOSIS — G250 Essential tremor: Secondary | ICD-10-CM | POA: Insufficient documentation

## 2015-04-11 ENCOUNTER — Telehealth: Payer: Self-pay | Admitting: *Deleted

## 2015-04-11 ENCOUNTER — Encounter: Payer: Self-pay | Admitting: *Deleted

## 2015-04-11 NOTE — Telephone Encounter (Signed)
Faxed results to PCP and mailed a copy to the pt 04/11/15.

## 2015-04-11 NOTE — Telephone Encounter (Signed)
-----   Message from Anson Fret, MD sent at 04/08/2015  2:11 PM EDT ----- Let patient know her TSH was slightly abnormal. Her Free T4 was however normal. She should follow up with her primary care for evaluation of hypothyroidism. Otherwise labs normal thanks

## 2015-04-11 NOTE — Telephone Encounter (Signed)
Spoke with pt about elevated TSH at 5.5 (normal being 0.45-4.5) and she needs to f/u with her PCP to evaluate for hypothyroidism. Her T4 was normal as well as other labs. I will fax a copy to her PCP. She requested I mail her a copy as well.

## 2015-04-13 ENCOUNTER — Ambulatory Visit
Admission: RE | Admit: 2015-04-13 | Discharge: 2015-04-13 | Disposition: A | Discharge status: Home / Self Care | Payer: Medicare Other | Source: Ambulatory Visit | Attending: Neurology | Admitting: Neurology

## 2015-04-13 DIAGNOSIS — G20 Parkinson's disease: Secondary | ICD-10-CM | POA: Diagnosis not present

## 2015-04-13 DIAGNOSIS — R251 Tremor, unspecified: Secondary | ICD-10-CM | POA: Diagnosis not present

## 2015-04-13 DIAGNOSIS — R413 Other amnesia: Secondary | ICD-10-CM | POA: Diagnosis not present

## 2015-04-18 ENCOUNTER — Telehealth: Payer: Self-pay | Admitting: *Deleted

## 2015-04-18 NOTE — Telephone Encounter (Signed)
Called and spoke w/ pt about MRI brain showing atrophy (shrinkage of the brain) and white matter changes which are normal for her age. No strokes, lesions, tumors, and no abnormality to explain her tremor. She verbalized understanding and has no questions at this time.

## 2015-04-18 NOTE — Telephone Encounter (Signed)
-----   Message from Anson Fret, MD sent at 04/15/2015  2:07 PM EDT ----- Please let patient know the MRi of her brain showed some atrophy and white matter changes that are normal for age. No strokes, no lesions, no tumor, no abnormality to account for her tremor.

## 2015-07-11 ENCOUNTER — Encounter: Payer: Self-pay | Admitting: Neurology

## 2015-07-11 ENCOUNTER — Ambulatory Visit (INDEPENDENT_AMBULATORY_CARE_PROVIDER_SITE_OTHER): Payer: Medicare Other | Admitting: Neurology

## 2015-07-11 VITALS — BP 157/80 | HR 74 | Ht 65.0 in | Wt 142.8 lb

## 2015-07-11 DIAGNOSIS — G250 Essential tremor: Secondary | ICD-10-CM

## 2015-07-11 MED ORDER — PROPRANOLOL HCL ER 80 MG PO CP24: 80.0000 mg | ORAL_CAPSULE | Freq: Every day | ORAL | Status: DC

## 2015-07-11 MED ORDER — PRIMIDONE 50 MG PO TABS: 200.0000 mg | ORAL_TABLET | Freq: Four times a day (QID) | ORAL | Status: DC

## 2015-07-11 NOTE — Progress Notes (Signed)
[facility]    Provider:  Dr Lucia Gaskins Referring Provider: Kari Baars, MD Primary Care Physician:  Fredirick Maudlin, MD  CC: Essential Tremor  Interval history:  Her tremor has not improved. Still with significant head tremor.Reviewed MRi of the brain wuth patient and daughter. Tolerating the Primidone well.   MRi of the brain: This is an abnormal noncontrasted MRI of the brain showing mild cortical atrophy and white matter foci consistent with chronic microvascular ischemic change in her   HPI: Lori Bell is a 69 y.o. female here as a referral from Dr. Juanetta Gosling for evaluation of parkinson's. Past medical history of anxiety, depression, hyperlipidemia, hypertension, tremor, dementia without behavioral disturbance. Her head won't stop wobbling. Started just in July of this year. The daughters never noticed it before then. It is even worse than it is today sometimes. Primidone helped. The primidone makes her tired. She got used to it. Hands are not really shaking but she does endorse tremor in the hands as well just not as bad.No shhuffling. Voice has not gotten any lower. No drooling or wet pillows. No loss of smell or taste. No difficulty swallowing. No new mood changes. No hallucinations. She has some memory loss but no demention, mild cognitive aging. No falls. Her father had parkinson's disease. No other family members with tremor. No new medications.   Reviewed notes, labs and imaging from outside physicians, which showed: She takes primidone 50 mg twice a day. He was started in July 2016. Personally reviewed CT of the head from October 2015 which showed some mild atrophy but otherwise unremarkable.  Review of Systems: Patient complains of symptoms per HPI as well as the following symptoms: tremors. No CP or SOB. Pertinent negatives per HPI. All others negative.   Social History   Social History  . Marital Status: Widowed    Spouse Name: N/A  . Number of  Children: 4  . Years of Education: 9   Occupational History  . Retired     Social History Main Topics  . Smoking status: Never Smoker   . Smokeless tobacco: Not on file  . Alcohol Use: No  . Drug Use: No  . Sexual Activity: Not on file   Other Topics Concern  . Not on file   Social History Narrative   Lives at home by self.   Caffeine use: 1 cup coffee/day    Exercises regularly    Family History  Problem Relation Age of Onset  . Coronary artery disease Other   . Cancer Brother   . Dementia Brother   . Dementia Brother   . Dementia Sister   . Parkinsonism Father     Past Medical History  Diagnosis Date  . Hypertension   . Hyperlipidemia   . GERD (gastroesophageal reflux disease)   . ASCVD (arteriosclerotic cardiovascular disease)   . Depression     Past Surgical History  Procedure Laterality Date  . Inner ear surgery  1966    Right  . Breast biopsy  1997    Left  . Tubal ligation  1970  . Total abdominal hysterectomy w/ bilateral salpingoophorectomy  05/2009    Current Outpatient Prescriptions  Medication Sig Dispense Refill  . aspirin 81 MG tablet Take 81 mg by mouth at bedtime.     . Calcium Citrate-Vitamin D (CALCIUM CITRATE + PO) Take 500 mg by mouth daily.    Marland Kitchen desvenlafaxine (PRISTIQ) 50 MG 24 hr tablet Take 50 mg by mouth at bedtime.     Marland Kitchen  donepezil (ARICEPT) 10 MG tablet Take 10 mg by mouth at bedtime.    Marland Kitchen. levothyroxine (SYNTHROID, LEVOTHROID) 25 MCG tablet Take 25 mcg by mouth daily before breakfast.    . pravastatin (PRAVACHOL) 40 MG tablet Take 40 mg by mouth daily.    . primidone (MYSOLINE) 50 MG tablet Take 100mg  at night and 50mg  in the morning 90 tablet 6  . valsartan-hydrochlorothiazide (DIOVAN-HCT) 160-12.5 MG per tablet Take 1 tablet by mouth at bedtime. 150-12.5 mg (04/07/15)     No current facility-administered medications for this visit.    Allergies as of 07/11/2015  . (No Known Allergies)    Vitals: BP 157/80 mmHg  Pulse 74   Ht 5\' 5"  (1.651 m)  Wt 142 lb 12.8 oz (64.774 kg)  BMI 23.76 kg/m2 Last Weight:  Wt Readings from Last 1 Encounters:  07/11/15 142 lb 12.8 oz (64.774 kg)   Last Height:   Ht Readings from Last 1 Encounters:  07/11/15 5\' 5"  (1.651 m)     Cognition:  The patient is oriented to person, place, and time;   Cranial Nerves:  The pupils are equal, round, and reactive to light. Visual fields are full to finger confrontation. Extraocular movements are intact. Trigeminal sensation is intact and the muscles of mastication are normal. The face is symmetric. The palate elevates in the midline. Hearing intact to voice. Voice is normal. Shoulder shrug is normal. The tongue has normal motion without fasciculations.   Coordination:  Normal finger to nose and heel to shin. Normal rapid alternating movements.   Motor Observation:  No-no head tremor, mild postural tremor with slight kinetic component Tone:  Normal muscle tone.   Posture:  Posture is normal. normal erect   Strength:  Strength is V/V in the upper and lower limbs.       Assessment/Plan: 69 year old female with likely essential tremor. Not improved with primidone. We'll increase dose. She has a significant head tremor, will also add propranolol. Watch for lightheadedness, sob, dec pulse and BP. Take BP daily. Do not take with other BP medications. F/u 3 months, if no response will increase furtehr and refer to Dr. Frances FurbishAthar, movement disorder specialist.   Naomie DeanAntonia Jones Viviani, MD  [facility] [address] [place], [place] [address]  Phone [phone] Fax [phone]  A total of 25 minutes was spent face-to-face with this patient. Over half this time was spent on counseling patient on the essential tremor diagnosis and different diagnostic and therapeutic options available.

## 2015-07-11 NOTE — Patient Instructions (Signed)
Remember to drink plenty of fluid, eat healthy meals and do not skip any meals. Try to eat protein with a every meal and eat a healthy snack such as fruit or nuts in between meals. Try to keep a regular sleep-wake schedule and try to exercise daily, particularly in the form of walking, 20-30 minutes a day, if you can.   As far as your medications are concerned, I would like to suggest: Increase the primidone to 200mg  all at night Start propranolol 80mg  at night  As far as diagnostic testing:   I would like to see you back in 3 months, sooner if we need to. Please call us with any interim questions, concerns, problems, updates or refill requests.   Our phone number is [phone]. We also have an after hours call service for urgent matters and there is a physician on-call for urgent questions. For any emergencies you know to call 911 or go to the nearest emergency room

## 2015-07-26 ENCOUNTER — Encounter: Payer: Self-pay | Admitting: Pulmonary Disease

## 2015-10-11 ENCOUNTER — Ambulatory Visit: Payer: Medicare Other | Admitting: Neurology

## 2015-10-17 ENCOUNTER — Telehealth: Payer: Self-pay | Admitting: Neurology

## 2015-10-17 NOTE — Telephone Encounter (Signed)
Darlene , pts daughter would like a call back pertaining to the correct dosage of medication her mother is suppose to be taking, primidone (MYSOLINE) 50 MG tablet. Please call [phone]

## 2015-10-17 NOTE — Telephone Encounter (Signed)
Returned Lori Bell's call. LVM for her to call back. Gave [facility] phone number.  Called [phone] and spoke to [place]. Advised pt should be taking a total of  all at once at bedtime per Dr Lucia Gaskins note. She verbalized understanding.

## 2015-10-20 ENCOUNTER — Ambulatory Visit (INDEPENDENT_AMBULATORY_CARE_PROVIDER_SITE_OTHER): Payer: PPO | Admitting: Neurology

## 2015-10-20 ENCOUNTER — Encounter: Payer: Self-pay | Admitting: Neurology

## 2015-10-20 VITALS — BP 149/73 | HR 61 | Ht 65.0 in | Wt 149.0 lb

## 2015-10-20 DIAGNOSIS — G250 Essential tremor: Secondary | ICD-10-CM | POA: Diagnosis not present

## 2015-10-20 MED ORDER — PRIMIDONE 50 MG PO TABS: 150.0000 mg | ORAL_TABLET | Freq: Four times a day (QID) | ORAL | Status: DC

## 2015-10-20 NOTE — Patient Instructions (Signed)
Remember to drink plenty of fluid, eat healthy meals and do not skip any meals. Try to eat protein with a every meal and eat a healthy snack such as fruit or nuts in between meals. Try to keep a regular sleep-wake schedule and try to exercise daily, particularly in the form of walking, 20-30 minutes a day, if you can.   As far as your medications are concerned, I would like to suggest Decrease Primidone(Mysoline) to 3 pills a night for a month. If no worsening can decrease to 2 pills for a month. Can decrease by a pill every month except on the last pill decrease by half.   I would like to see you back in 6 months, sooner if we need to. Please call us with any interim questions, concerns, problems, updates or refill requests.   Our phone number is [phone]. We also have an after hours call service for urgent matters and there is a physician on-call for urgent questions. For any emergencies you know to call 911 or go to the nearest emergency room

## 2015-10-20 NOTE — Progress Notes (Signed)
[facility]    Provider:  Dr Lucia Gaskins Referring Provider: Kari Baars, MD Primary Care Physician:  Fredirick Maudlin, MD  CC: Essential Tremor  Interval history: She is much improved.She thinks it was the propranolol that really helped. No side effects. No SOB, feeling tired, CP, depression.   Interval history: Her tremor has not improved. Still with significant head tremor.Reviewed MRi of the brain wuth patient and daughter. Tolerating the Primidone well.   MRi of the brain: This is an abnormal noncontrasted MRI of the brain showing mild cortical atrophy and white matter foci consistent with chronic microvascular ischemic change in her   HPI: Lori Bell is a 70 y.o. female here as a referral from Dr. Juanetta Gosling for evaluation of parkinson's. Past medical history of anxiety, depression, hyperlipidemia, hypertension, tremor, dementia without behavioral disturbance. Her head won't stop wobbling. Started just in July of this year. The daughters never noticed it before then. It is even worse than it is today sometimes. Primidone helped. The primidone makes her tired. She got used to it. Hands are not really shaking but she does endorse tremor in the hands as well just not as bad.No shhuffling. Voice has not gotten any lower. No drooling or wet pillows. No loss of smell or taste. No difficulty swallowing. No new mood changes. No hallucinations. She has some memory loss but no demention, mild cognitive aging. No falls. Her father had parkinson's disease. No other family members with tremor. No new medications.   Reviewed notes, labs and imaging from outside physicians, which showed: She takes primidone 50 mg twice a day. He was started in July 2016. Personally reviewed CT of the head from October 2015 which showed some mild atrophy but otherwise unremarkable.  Review of Systems: Patient complains of symptoms per HPI as well as the following symptoms: tremors. No CP or SOB.  Pertinent negatives per HPI. All others negative.   Social History   Social History  . Marital Status: Widowed    Spouse Name: N/A  . Number of Children: 4  . Years of Education: 9   Occupational History  . Retired     Social History Main Topics  . Smoking status: Never Smoker   . Smokeless tobacco: Not on file  . Alcohol Use: No  . Drug Use: No  . Sexual Activity: Not on file   Other Topics Concern  . Not on file   Social History Narrative   Lives at home by self.   Caffeine use: 1 cup coffee/day    Exercises regularly    Family History  Problem Relation Age of Onset  . Coronary artery disease Other   . Cancer Brother   . Dementia Brother   . Dementia Brother   . Dementia Sister   . Parkinsonism Father     Past Medical History  Diagnosis Date  . Hypertension   . Hyperlipidemia   . GERD (gastroesophageal reflux disease)   . ASCVD (arteriosclerotic cardiovascular disease)   . Depression     Past Surgical History  Procedure Laterality Date  . Inner ear surgery  1966    Right  . Breast biopsy  1997    Left  . Tubal ligation  1970  . Total abdominal hysterectomy w/ bilateral salpingoophorectomy  05/2009    Current Outpatient Prescriptions  Medication Sig Dispense Refill  . aspirin 81 MG tablet Take 81 mg by mouth at bedtime.     . Calcium Citrate-Vitamin D (CALCIUM CITRATE + PO) Take  500 mg by mouth daily.    Marland Kitchen desvenlafaxine (PRISTIQ) 50 MG 24 hr tablet Take 50 mg by mouth at bedtime.     . donepezil (ARICEPT) 10 MG tablet Take 10 mg by mouth at bedtime.    Marland Kitchen levothyroxine (SYNTHROID, LEVOTHROID) 25 MCG tablet Take 25 mcg by mouth daily before breakfast.    . pravastatin (PRAVACHOL) 40 MG tablet Take 40 mg by mouth daily.    . primidone (MYSOLINE) 50 MG tablet Take 4 tablets (200 mg total) by mouth 4 (four) times daily. 120 tablet 6  . propranolol ER (INDERAL LA) 80 MG 24 hr capsule Take 1 capsule (80 mg total) by mouth at bedtime. 30 capsule 6  .  valsartan-hydrochlorothiazide (DIOVAN-HCT) 160-12.5 MG per tablet Take 1 tablet by mouth at bedtime. 150-12.5 mg (04/07/15)     No current facility-administered medications for this visit.    Allergies as of 10/20/2015  . (No Known Allergies)    Vitals: There were no vitals taken for this visit. Last Weight:  Wt Readings from Last 1 Encounters:  07/11/15 142 lb 12.8 oz (64.774 kg)   Last Height:   Ht Readings from Last 1 Encounters:  07/11/15  (1.651 m)     Cognition:  The patient is oriented to person, place, and time;   Cranial Nerves:  The pupils are equal, round, and reactive to light. Visual fields are full to finger confrontation. Extraocular movements are intact. Trigeminal sensation is intact and the muscles of mastication are normal. The face is symmetric. The palate elevates in the midline. Hearing intact to voice. Voice is normal. Shoulder shrug is normal. The tongue has normal motion without fasciculations.   Coordination:  Normal finger to nose and heel to shin. Normal rapid alternating movements.   Motor Observation:  No-no head tremor, mild postural tremor with slight kinetic component Tone:  Normal muscle tone.   Posture:  Posture is normal. normal erect   Strength:  Strength is V/V in the upper and lower limbs.       Assessment/Plan: 70 year old female with likely essential tremor. Not improved with primidone. We'll increase dose. She has a significant head tremor, will also add propranolol. Watch for lightheadedness, sob, dec pulse and BP. Take BP daily. Do not take with other BP medications. F/u 6 months.  Impact of propranolol was significant. Her tremor is improved. Will slowly decrease primidone over 4-5 months. If tremor worsens go back up on the primidone.    Naomie Dean, MD  [facility] [address] [place], [place] [address]  Phone [phone] Fax [phone]  A total  of 15 minutes was spent face-to-face with this patient. Over half this time was spent on counseling patient on the essential tremor diagnosis and different diagnostic and therapeutic options available.

## 2015-12-02 ENCOUNTER — Encounter: Payer: Self-pay | Admitting: Pulmonary Disease

## 2015-12-02 DIAGNOSIS — R5383 Other fatigue: Secondary | ICD-10-CM | POA: Diagnosis not present

## 2015-12-02 DIAGNOSIS — R531 Weakness: Secondary | ICD-10-CM | POA: Diagnosis not present

## 2016-01-25 DIAGNOSIS — F0390 Unspecified dementia without behavioral disturbance: Secondary | ICD-10-CM | POA: Diagnosis not present

## 2016-01-25 DIAGNOSIS — I10 Essential (primary) hypertension: Secondary | ICD-10-CM | POA: Diagnosis not present

## 2016-01-25 DIAGNOSIS — H04123 Dry eye syndrome of bilateral lacrimal glands: Secondary | ICD-10-CM | POA: Diagnosis not present

## 2016-01-25 DIAGNOSIS — H16223 Keratoconjunctivitis sicca, not specified as Sjogren's, bilateral: Secondary | ICD-10-CM | POA: Diagnosis not present

## 2016-01-25 DIAGNOSIS — R251 Tremor, unspecified: Secondary | ICD-10-CM | POA: Diagnosis not present

## 2016-01-25 DIAGNOSIS — F329 Major depressive disorder, single episode, unspecified: Secondary | ICD-10-CM | POA: Diagnosis not present

## 2016-02-22 ENCOUNTER — Other Ambulatory Visit (HOSPITAL_COMMUNITY): Payer: Self-pay | Admitting: Pulmonary Disease

## 2016-02-22 DIAGNOSIS — Z1231 Encounter for screening mammogram for malignant neoplasm of breast: Secondary | ICD-10-CM

## 2016-03-05 ENCOUNTER — Ambulatory Visit (HOSPITAL_COMMUNITY)
Admission: RE | Admit: 2016-03-05 | Discharge: 2016-03-05 | Disposition: A | Discharge status: Home / Self Care | Payer: PPO | Source: Ambulatory Visit | Attending: Pulmonary Disease | Admitting: Pulmonary Disease

## 2016-03-05 DIAGNOSIS — F0390 Unspecified dementia without behavioral disturbance: Secondary | ICD-10-CM | POA: Diagnosis not present

## 2016-03-05 DIAGNOSIS — Z1231 Encounter for screening mammogram for malignant neoplasm of breast: Secondary | ICD-10-CM | POA: Diagnosis not present

## 2016-03-05 DIAGNOSIS — I10 Essential (primary) hypertension: Secondary | ICD-10-CM | POA: Diagnosis not present

## 2016-03-05 DIAGNOSIS — R251 Tremor, unspecified: Secondary | ICD-10-CM | POA: Diagnosis not present

## 2016-03-05 DIAGNOSIS — F419 Anxiety disorder, unspecified: Secondary | ICD-10-CM | POA: Diagnosis not present

## 2016-04-24 ENCOUNTER — Encounter: Payer: Self-pay | Admitting: Neurology

## 2016-04-24 ENCOUNTER — Ambulatory Visit (INDEPENDENT_AMBULATORY_CARE_PROVIDER_SITE_OTHER): Payer: PPO | Admitting: Neurology

## 2016-04-24 VITALS — BP 135/81 | HR 60 | Ht 65.0 in | Wt 159.0 lb

## 2016-04-24 DIAGNOSIS — G250 Essential tremor: Secondary | ICD-10-CM | POA: Diagnosis not present

## 2016-04-24 MED ORDER — PROPRANOLOL HCL ER 80 MG PO CP24: 80.0000 mg | ORAL_CAPSULE | Freq: Every day | ORAL | 4 refills | Status: DC

## 2016-04-24 NOTE — Progress Notes (Signed)
[facility]    Provider:  Dr Lucia Gaskins Referring Provider: Kari Baars, MD Primary Care Physician:  Fredirick Maudlin, MD  CC: Essential Tremor  Interval history: She is much improved. propranolol that really helped. No side effects. No SOB, feeling tired, CP, depression. She is on Inderal 80mg  at night. No SOb, Bp is fine, no dizziness when sitting or standing, no side effects whatsoever ane the tremor is better. She stopped taking the medicine and she was having bobbing again, muscles in the neck hurting. She restarted the [facility] and she is better again. Pulse is 60, will need to watch this. Advised her to ensure she hydrates adequately.  Interval history: Her tremor has not improved. Still with significant head tremor.Reviewed MRi of the brain wuth patient and daughter. Tolerating the Primidone well.   MRi of the brain: This is an abnormal noncontrasted MRI of the brain showing mild cortical atrophy and white matter foci consistent with chronic microvascular ischemic change in her   HPI: Lori Bell is a 70 y.o. female here as a referral from Dr. Juanetta Gosling for evaluation of parkinson's. Past medical history of anxiety, depression, hyperlipidemia, hypertension, tremor, dementia without behavioral disturbance. Her head won't stop wobbling. Started just in July of this year. The daughters never noticed it before then. It is even worse than it is today sometimes. Primidone helped. The primidone makes her tired. She got used to it. Hands are not really shaking but she does endorse tremor in the hands as well just not as bad.No shhuffling. Voice has not gotten any lower. No drooling or wet pillows. No loss of smell or taste. No difficulty swallowing. No new mood changes. No hallucinations. She has some memory loss but no demention, mild cognitive aging. No falls. Her father had parkinson's disease. No other family members with tremor. No new medications.   Reviewed  notes, labs and imaging from outside physicians, which showed: She takes primidone 50 mg twice a day. He was started in July 2016. Personally reviewed CT of the head from October 2015 which showed some mild atrophy but otherwise unremarkable.  Review of Systems: Patient complains of symptoms per HPI as well as the following symptoms: tremors. No CP or SOB. Pertinent negatives per HPI. All others negative.   Social History   Social History  . Marital status: Widowed    Spouse name: N/A  . Number of children: 4  . Years of education: 9   Occupational History  . Retired     Social History Main Topics  . Smoking status: Never Smoker  . Smokeless tobacco: Never Used  . Alcohol use No  . Drug use: No  . Sexual activity: Not on file   Other Topics Concern  . Not on file   Social History Narrative   Lives at home by self.   Caffeine use: 1 cup coffee/day    Exercises regularly    Family History  Problem Relation Age of Onset  . Parkinsonism Father   . Coronary artery disease Other   . Cancer Brother   . Dementia Brother   . Dementia Brother   . Dementia Sister     Past Medical History:  Diagnosis Date  . ASCVD (arteriosclerotic cardiovascular disease)   . Depression   . GERD (gastroesophageal reflux disease)   . Hyperlipidemia   . Hypertension     Past Surgical History:  Procedure Laterality Date  . BREAST BIOPSY  1997   Left  . INNER EAR SURGERY  1966   Right  . TOTAL ABDOMINAL HYSTERECTOMY W/ BILATERAL SALPINGOOPHORECTOMY  05/2009  . TUBAL LIGATION  1970    Current Outpatient Prescriptions  Medication Sig Dispense Refill  . aspirin 81 MG tablet Take 81 mg by mouth at bedtime.     . Calcium Citrate-Vitamin D (CALCIUM CITRATE + PO) Take 500 mg by mouth 2 (two) times daily.     Marland Kitchen. desvenlafaxine (PRISTIQ) 50 MG 24 hr tablet Take 50 mg by mouth at bedtime.     . irbesartan-hydrochlorothiazide (AVALIDE) 150-12.5 MG tablet Take 1 tablet by mouth daily.    Marland Kitchen.  levothyroxine (SYNTHROID, LEVOTHROID) 25 MCG tablet Take 25 mcg by mouth daily before breakfast.    . Memantine HCl ER (NAMENDA XR) 21 MG CP24 Take 1 tablet by mouth at bedtime.    . Omega-3 Fatty Acids (FISH OIL) 1000 MG CAPS Take 1,000 mg by mouth 2 (two) times daily.    . pravastatin (PRAVACHOL) 40 MG tablet Take 40 mg by mouth daily.    . propranolol ER (INDERAL LA) 80 MG 24 hr capsule Take 1 capsule (80 mg total) by mouth at bedtime. 30 capsule 6   No current facility-administered medications for this visit.     Allergies as of 04/24/2016  . (No Known Allergies)    Vitals: BP 135/81 (BP Location: Right Arm, Patient Position: Sitting, Cuff Size: Normal)   Pulse 60   Ht 5\' 5"  (1.651 m)   Wt 159 lb (72.1 kg)   BMI 26.46 kg/m  Last Weight:  Wt Readings from Last 1 Encounters:  04/24/16 159 lb (72.1 kg)   Last Height:   Ht Readings from Last 1 Encounters:  04/24/16 5\' 5"  (1.651 m)     Cognition:  The patient is oriented to person, place, and time;   Cranial Nerves:  The pupils are equal, round, and reactive to light. Visual fields are full to finger confrontation. Extraocular movements are intact. Trigeminal sensation is intact and the muscles of mastication are normal. The face is symmetric. The palate elevates in the midline. Hearing intact to voice. Voice is normal. Shoulder shrug is normal. The tongue has normal motion without fasciculations.   Coordination:  Normal finger to nose and heel to shin. Normal rapid alternating movements.   Motor Observation:  No-no head tremor, mild postural tremor with slight kinetic component Tone:  Normal muscle tone.   Posture:  Posture is normal. normal erect   Strength:  Strength is V/V in the upper and lower limbs.       Assessment/Plan: 70 year old female with essential tremor. Resolved on propranololl. Watch for lightheadedness, sob, dec pulse and BP. Take BP daily. Do not take with other BP  medications. F/u 6 months.  Impact of propranolol was significant. Her tremor is improved. Continue. Discussed staying hydrated, side effects to watch for, stop for anything concerning or may decrease to 60mg  ER  Lori DeanAntonia Ahern, MD  [facility] [address] [place], [place] [id number]  Phone [phone] Fax [phone]  A total of 15 minutes was spent face-to-face with this patient. Over half this time was spent on counseling patient on the essential diagnosis and different diagnostic and therapeutic options available.

## 2016-04-24 NOTE — Patient Instructions (Signed)
Remember to drink plenty of fluid, eat healthy meals and do not skip any meals. Try to eat protein with a every meal and eat a healthy snack such as fruit or nuts in between meals. Try to keep a regular sleep-wake schedule and try to exercise daily, particularly in the form of walking, 20-30 minutes a day, if you can.   As far as your medications are concerned, I would like to suggest  As far as diagnostic testing: continue propranolol  I would like to see you back in 1 year, sooner if we need to. Please call us with any interim questions, concerns, problems, updates or refill requests.   Please also call us for any test results so we can go over those with you on the phone.  My clinical assistant and will answer any of your questions and relay your messages to me and also relay most of my messages to you.   Our phone number is [phone]. We also have an after hours call service for urgent matters and there is a physician on-call for urgent questions. For any emergencies you know to call 911 or go to the nearest emergency room

## 2016-06-06 DIAGNOSIS — F0390 Unspecified dementia without behavioral disturbance: Secondary | ICD-10-CM | POA: Diagnosis not present

## 2016-06-06 DIAGNOSIS — E039 Hypothyroidism, unspecified: Secondary | ICD-10-CM | POA: Diagnosis not present

## 2016-06-06 DIAGNOSIS — E785 Hyperlipidemia, unspecified: Secondary | ICD-10-CM | POA: Diagnosis not present

## 2016-06-06 DIAGNOSIS — I10 Essential (primary) hypertension: Secondary | ICD-10-CM | POA: Diagnosis not present

## 2016-06-06 DIAGNOSIS — Z23 Encounter for immunization: Secondary | ICD-10-CM | POA: Diagnosis not present

## 2016-07-25 DIAGNOSIS — H16223 Keratoconjunctivitis sicca, not specified as Sjogren's, bilateral: Secondary | ICD-10-CM | POA: Diagnosis not present

## 2016-07-25 DIAGNOSIS — H2513 Age-related nuclear cataract, bilateral: Secondary | ICD-10-CM | POA: Diagnosis not present

## 2016-09-11 ENCOUNTER — Encounter: Payer: Self-pay | Admitting: Pulmonary Disease

## 2016-09-11 DIAGNOSIS — E039 Hypothyroidism, unspecified: Secondary | ICD-10-CM | POA: Diagnosis not present

## 2016-09-11 DIAGNOSIS — R251 Tremor, unspecified: Secondary | ICD-10-CM | POA: Diagnosis not present

## 2016-09-11 DIAGNOSIS — F419 Anxiety disorder, unspecified: Secondary | ICD-10-CM | POA: Diagnosis not present

## 2016-09-11 DIAGNOSIS — E782 Mixed hyperlipidemia: Secondary | ICD-10-CM | POA: Diagnosis not present

## 2016-09-11 DIAGNOSIS — I10 Essential (primary) hypertension: Secondary | ICD-10-CM | POA: Diagnosis not present

## 2016-09-11 DIAGNOSIS — F329 Major depressive disorder, single episode, unspecified: Secondary | ICD-10-CM | POA: Diagnosis not present

## 2016-09-11 DIAGNOSIS — F0390 Unspecified dementia without behavioral disturbance: Secondary | ICD-10-CM | POA: Diagnosis not present

## 2016-09-14 DIAGNOSIS — F419 Anxiety disorder, unspecified: Secondary | ICD-10-CM | POA: Diagnosis not present

## 2016-09-14 DIAGNOSIS — F0390 Unspecified dementia without behavioral disturbance: Secondary | ICD-10-CM | POA: Diagnosis not present

## 2016-09-14 DIAGNOSIS — F321 Major depressive disorder, single episode, moderate: Secondary | ICD-10-CM | POA: Diagnosis not present

## 2016-09-14 DIAGNOSIS — I10 Essential (primary) hypertension: Secondary | ICD-10-CM | POA: Diagnosis not present

## 2016-11-14 ENCOUNTER — Other Ambulatory Visit (HOSPITAL_COMMUNITY): Payer: Self-pay | Admitting: Pulmonary Disease

## 2016-11-14 DIAGNOSIS — Z1231 Encounter for screening mammogram for malignant neoplasm of breast: Secondary | ICD-10-CM

## 2016-12-14 DIAGNOSIS — I10 Essential (primary) hypertension: Secondary | ICD-10-CM | POA: Diagnosis not present

## 2016-12-14 DIAGNOSIS — F321 Major depressive disorder, single episode, moderate: Secondary | ICD-10-CM | POA: Diagnosis not present

## 2016-12-14 DIAGNOSIS — R1010 Upper abdominal pain, unspecified: Secondary | ICD-10-CM | POA: Diagnosis not present

## 2016-12-14 DIAGNOSIS — F0390 Unspecified dementia without behavioral disturbance: Secondary | ICD-10-CM | POA: Diagnosis not present

## 2017-03-06 ENCOUNTER — Ambulatory Visit (HOSPITAL_COMMUNITY): Payer: PPO

## 2017-03-20 ENCOUNTER — Encounter: Payer: Self-pay | Admitting: Pulmonary Disease

## 2017-03-20 ENCOUNTER — Other Ambulatory Visit (HOSPITAL_COMMUNITY): Payer: Self-pay | Admitting: Pulmonary Disease

## 2017-03-20 DIAGNOSIS — Z780 Asymptomatic menopausal state: Secondary | ICD-10-CM

## 2017-03-20 DIAGNOSIS — E039 Hypothyroidism, unspecified: Secondary | ICD-10-CM | POA: Diagnosis not present

## 2017-03-20 DIAGNOSIS — E782 Mixed hyperlipidemia: Secondary | ICD-10-CM | POA: Diagnosis not present

## 2017-03-20 DIAGNOSIS — F419 Anxiety disorder, unspecified: Secondary | ICD-10-CM | POA: Diagnosis not present

## 2017-03-20 DIAGNOSIS — Z0000 Encounter for general adult medical examination without abnormal findings: Secondary | ICD-10-CM | POA: Diagnosis not present

## 2017-03-20 DIAGNOSIS — R251 Tremor, unspecified: Secondary | ICD-10-CM | POA: Diagnosis not present

## 2017-03-20 DIAGNOSIS — I10 Essential (primary) hypertension: Secondary | ICD-10-CM | POA: Diagnosis not present

## 2017-03-20 DIAGNOSIS — F329 Major depressive disorder, single episode, unspecified: Secondary | ICD-10-CM | POA: Diagnosis not present

## 2017-03-20 DIAGNOSIS — F0390 Unspecified dementia without behavioral disturbance: Secondary | ICD-10-CM | POA: Diagnosis not present

## 2017-03-20 LAB — HEMOGLOBIN A1C: Hemoglobin A1C: 5.7

## 2017-03-28 ENCOUNTER — Ambulatory Visit (HOSPITAL_COMMUNITY): Payer: PPO

## 2017-04-04 ENCOUNTER — Ambulatory Visit (HOSPITAL_COMMUNITY): Payer: PPO

## 2017-04-04 ENCOUNTER — Other Ambulatory Visit (HOSPITAL_COMMUNITY): Payer: PPO

## 2017-04-11 ENCOUNTER — Ambulatory Visit (HOSPITAL_COMMUNITY)
Admission: RE | Admit: 2017-04-11 | Discharge: 2017-04-11 | Disposition: A | Discharge status: Home / Self Care | Payer: PPO | Source: Ambulatory Visit | Attending: Pulmonary Disease | Admitting: Pulmonary Disease

## 2017-04-11 DIAGNOSIS — Z780 Asymptomatic menopausal state: Secondary | ICD-10-CM | POA: Diagnosis not present

## 2017-04-11 DIAGNOSIS — Z1231 Encounter for screening mammogram for malignant neoplasm of breast: Secondary | ICD-10-CM

## 2017-04-11 DIAGNOSIS — Z13820 Encounter for screening for osteoporosis: Secondary | ICD-10-CM | POA: Insufficient documentation

## 2017-04-11 DIAGNOSIS — M810 Age-related osteoporosis without current pathological fracture: Secondary | ICD-10-CM | POA: Insufficient documentation

## 2017-04-11 DIAGNOSIS — M8589 Other specified disorders of bone density and structure, multiple sites: Secondary | ICD-10-CM | POA: Diagnosis not present

## 2017-04-11 LAB — HM MAMMOGRAPHY

## 2017-04-11 LAB — HM DEXA SCAN

## 2017-04-24 ENCOUNTER — Ambulatory Visit: Payer: PPO | Admitting: Neurology

## 2017-06-09 ENCOUNTER — Encounter (HOSPITAL_COMMUNITY): Payer: Self-pay | Admitting: Emergency Medicine

## 2017-06-09 ENCOUNTER — Emergency Department (HOSPITAL_COMMUNITY)
Admission: EM | Admit: 2017-06-09 | Discharge: 2017-06-09 | Disposition: A | Discharge status: Home / Self Care | Payer: PPO | Attending: Emergency Medicine | Admitting: Emergency Medicine

## 2017-06-09 DIAGNOSIS — F0390 Unspecified dementia without behavioral disturbance: Secondary | ICD-10-CM | POA: Diagnosis not present

## 2017-06-09 DIAGNOSIS — R197 Diarrhea, unspecified: Secondary | ICD-10-CM | POA: Diagnosis not present

## 2017-06-09 DIAGNOSIS — I10 Essential (primary) hypertension: Secondary | ICD-10-CM | POA: Diagnosis not present

## 2017-06-09 DIAGNOSIS — Z79899 Other long term (current) drug therapy: Secondary | ICD-10-CM | POA: Diagnosis not present

## 2017-06-09 DIAGNOSIS — E871 Hypo-osmolality and hyponatremia: Secondary | ICD-10-CM | POA: Insufficient documentation

## 2017-06-09 DIAGNOSIS — Z7982 Long term (current) use of aspirin: Secondary | ICD-10-CM | POA: Diagnosis not present

## 2017-06-09 DIAGNOSIS — R1030 Lower abdominal pain, unspecified: Secondary | ICD-10-CM | POA: Diagnosis not present

## 2017-06-09 DIAGNOSIS — R1084 Generalized abdominal pain: Secondary | ICD-10-CM | POA: Diagnosis present

## 2017-06-09 DIAGNOSIS — R531 Weakness: Secondary | ICD-10-CM | POA: Insufficient documentation

## 2017-06-09 LAB — CBC WITH DIFFERENTIAL/PLATELET
Basophils Absolute: 0 10*3/uL (ref 0.0–0.1)
Basophils Relative: 0 %
Eosinophils Absolute: 0.2 10*3/uL (ref 0.0–0.7)
Eosinophils Relative: 3 %
HCT: 39 % (ref 36.0–46.0)
Hemoglobin: 13.5 g/dL (ref 12.0–15.0)
Lymphocytes Relative: 15 %
Lymphs Abs: 1.3 10*3/uL (ref 0.7–4.0)
MCH: 30.3 pg (ref 26.0–34.0)
MCHC: 34.6 g/dL (ref 30.0–36.0)
MCV: 87.6 fL (ref 78.0–100.0)
Monocytes Absolute: 0.6 10*3/uL (ref 0.1–1.0)
Monocytes Relative: 7 %
Neutro Abs: 6.3 10*3/uL (ref 1.7–7.7)
Neutrophils Relative %: 75 %
Platelets: 301 10*3/uL (ref 150–400)
RBC: 4.45 MIL/uL (ref 3.87–5.11)
RDW: 12.8 % (ref 11.5–15.5)
WBC: 8.4 10*3/uL (ref 4.0–10.5)

## 2017-06-09 LAB — COMPREHENSIVE METABOLIC PANEL
ALT: 45 U/L (ref 14–54)
AST: 26 U/L (ref 15–41)
Albumin: 4.2 g/dL (ref 3.5–5.0)
Alkaline Phosphatase: 116 U/L (ref 38–126)
Anion gap: 11 (ref 5–15)
BUN: 15 mg/dL (ref 6–20)
CO2: 24 mmol/L (ref 22–32)
Calcium: 9.3 mg/dL (ref 8.9–10.3)
Chloride: 92 mmol/L — ABNORMAL LOW (ref 101–111)
Creatinine, Ser: 1.05 mg/dL — ABNORMAL HIGH (ref 0.44–1.00)
GFR calc Af Amer: 60 mL/min — ABNORMAL LOW (ref >60)
GFR calc non Af Amer: 52 mL/min — ABNORMAL LOW (ref >60)
Glucose, Bld: 145 mg/dL — ABNORMAL HIGH (ref 65–99)
Potassium: 2.9 mmol/L — ABNORMAL LOW (ref 3.5–5.1)
Sodium: 127 mmol/L — ABNORMAL LOW (ref 135–145)
Total Bilirubin: 0.9 mg/dL (ref 0.3–1.2)
Total Protein: 8 g/dL (ref 6.5–8.1)

## 2017-06-09 LAB — URINALYSIS, ROUTINE W REFLEX MICROSCOPIC
Glucose, UA: NEGATIVE mg/dL
Hgb urine dipstick: NEGATIVE
Ketones, ur: 5 mg/dL — AB
Leukocytes, UA: NEGATIVE
Nitrite: NEGATIVE
Protein, ur: NEGATIVE mg/dL
Specific Gravity, Urine: 1.02 (ref 1.005–1.030)
pH: 6 (ref 5.0–8.0)

## 2017-06-09 LAB — LIPASE, BLOOD: Lipase: 48 U/L (ref 11–51)

## 2017-06-09 MED ORDER — ACETAMINOPHEN 500 MG PO TABS
500.0000 mg | ORAL_TABLET | Freq: Once | ORAL | Status: AC
  Administered 2017-06-09: 500 mg via ORAL
  Filled 2017-06-09: qty 1

## 2017-06-09 MED ORDER — SODIUM CHLORIDE 0.9 % IV BOLUS (SEPSIS)
1000.0000 mL | Freq: Once | INTRAVENOUS | Status: AC
  Administered 2017-06-09: 1000 mL via INTRAVENOUS

## 2017-06-09 NOTE — Discharge Instructions (Signed)
Please be sure to follow-up with your primary care physician for a repeat evaluation this week.

## 2017-06-09 NOTE — ED Triage Notes (Signed)
Pt reports abd pain with one episode of diarrhea starting 1 hour ago.

## 2017-06-09 NOTE — ED Provider Notes (Signed)
[facility] EMERGENCY DEPARTMENT Provider Note   CSN: [id number] Arrival date & time: 06/09/17  1832     History   Chief Complaint Chief Complaint  Patient presents with  . Abdominal Pain  . Diarrhea    HPI Lori Bell is a 71 y.o. female.  HPI  Patient presents with her daughter who assists with the HPI. The patient has history of hypertension, hyperlipidemia, dementia. She was in her usual state of health until the past day or so. Reportedly the patient complained of abdominal pain earlier, had one episode of loose stool, and subsequent diaphoresis and weakness. However, the patient notes that she has improved substantially since that episode, and currently is without complaint. No report of new food, medication, diet, activity. The patient states that her pain was in the lower abdomen, diffuse, but not currently present. She also denies ongoing nausea, dysuria, hematuria.  Past Medical History:  Diagnosis Date  . ASCVD (arteriosclerotic cardiovascular disease)   . Depression   . GERD (gastroesophageal reflux disease)   . Hyperlipidemia   . Hypertension     Patient Active Problem List   Diagnosis Date Noted  . Essential tremor 04/10/2015  . Fracture of humerus, proximal, right, closed 08/28/2012  . Pain in joint, shoulder region 08/28/2012  . Muscle weakness (generalized) 08/28/2012  . ATHEROSCLEROTIC CARDIOVASCULAR DISEASE 06/28/2009  . GASTROESOPHAGEAL REFLUX DISEASE 06/28/2009    Past Surgical History:  Procedure Laterality Date  . BREAST BIOPSY  1997   Left  . INNER EAR SURGERY  1966   Right  . TOTAL ABDOMINAL HYSTERECTOMY W/ BILATERAL SALPINGOOPHORECTOMY  05/2009  . TUBAL LIGATION  1970    OB History    No data available       Home Medications    Prior to Admission medications   Medication Sig Start Date End Date Taking? Authorizing Provider  aspirin 81 MG tablet Take 81 mg by mouth at bedtime.     [provider]  Calcium  Citrate-Vitamin D (CALCIUM CITRATE + PO) Take 500 mg by mouth 2 (two) times daily.     [provider]  desvenlafaxine (PRISTIQ) 50 MG 24 hr tablet Take 50 mg by mouth at bedtime.     [provider]  irbesartan-hydrochlorothiazide (AVALIDE) 150-12.5 MG tablet Take 1 tablet by mouth daily.    [provider]  levothyroxine (SYNTHROID, LEVOTHROID) 25 MCG tablet Take 25 mcg by mouth daily before breakfast.    [provider]  Memantine HCl ER (NAMENDA XR) 21 MG CP24 Take 1 tablet by mouth at bedtime.    [provider]  Omega-3 Fatty Acids (FISH OIL) 1000 MG CAPS Take 1,000 mg by mouth 2 (two) times daily.    [provider]  pravastatin (PRAVACHOL) 40 MG tablet Take 40 mg by mouth daily.    [provider]  propranolol ER (INDERAL LA) 80 MG 24 hr capsule Take 1 capsule (80 mg total) by mouth at bedtime. 04/24/16   Anson Fret, MD    Family History Family History  Problem Relation Age of Onset  . Parkinsonism Father   . Coronary artery disease Other   . Cancer Brother   . Dementia Brother   . Dementia Brother   . Dementia Sister     Social History Social History  Substance Use Topics  . Smoking status: Never Smoker  . Smokeless tobacco: Never Used  . Alcohol use No     Allergies   Patient has no known  allergies.   Review of Systems Review of Systems  Constitutional:       Per HPI, otherwise negative  HENT:       Per HPI, otherwise negative  Respiratory:       Per HPI, otherwise negative  Cardiovascular:       Per HPI, otherwise negative  Gastrointestinal: Negative for vomiting.  Endocrine:       Negative aside from HPI  Genitourinary:       Neg aside from HPI   Musculoskeletal:       Per HPI, otherwise negative  Skin: Negative.   Neurological: Negative for syncope.     Physical Exam Updated Vital Signs BP 139/70 (BP Location: Left Arm)   Pulse 60   Temp 97.7 F (36.5 C) (Oral)   Resp 16   Ht  5\' 5"  (1.651 m)   Wt 72.6 kg (160 lb)   SpO2 93%   BMI 26.63 kg/m   Physical Exam  Constitutional: She is oriented to person, place, and time. She appears well-developed and well-nourished. No distress.  HENT:  Head: Normocephalic and atraumatic.  Eyes: Conjunctivae and EOM are normal.  Cardiovascular: Normal rate and regular rhythm.   Pulmonary/Chest: Effort normal and breath sounds normal. No stridor. No respiratory distress.  Abdominal: She exhibits no distension.  Musculoskeletal: She exhibits no edema.  Neurological: She is alert and oriented to person, place, and time. She displays atrophy. No cranial nerve deficit.  Skin: Skin is warm and dry.  Psychiatric: She is slowed and withdrawn. She exhibits abnormal remote memory.  Nursing note and vitals reviewed.    ED Treatments / Results  Labs (all labs ordered are listed, but only abnormal results are displayed) Labs Reviewed  COMPREHENSIVE METABOLIC PANEL - Abnormal; Notable for the following:       Result Value   Sodium 127 (*)    Potassium 2.9 (*)    Chloride 92 (*)    Glucose, Bld 145 (*)    Creatinine, Ser 1.05 (*)    GFR calc non Af Amer 52 (*)    GFR calc Af Amer 60 (*)    All other components within normal limits  URINALYSIS, ROUTINE W REFLEX MICROSCOPIC - Abnormal; Notable for the following:    Color, Urine AMBER (*)    Bilirubin Urine SMALL (*)    Ketones, ur 5 (*)    All other components within normal limits  LIPASE, BLOOD  CBC WITH DIFFERENTIAL/PLATELET    EKG  EKG Interpretation  Date/Time:  Sunday June 09 2017 18:39:24 EDT Ventricular Rate:  59 PR Interval:    QRS Duration: 105 QT Interval:  466 QTC Calculation: 462 R Axis:   6 Text Interpretation:  Sinus rhythm Low voltage, precordial leads Abnormal R-wave progression, early transition Borderline T abnormalities, anterior leads Abnormal ekg Confirmed by Gerhard Munch [phone]) on 06/09/2017 6:43:07 PM        Procedures Procedures  (including critical care time)  Medications Ordered in ED Medications  sodium chloride 0.9 % bolus 1,000 mL (1,000 mLs Intravenous New Bag/Given 06/09/17 2106)     Initial Impression / Assessment and Plan / ED Course  I have reviewed the triage vital signs and the nursing notes.  Pertinent labs & imaging results that were available during my care of the patient were reviewed by me and considered in my medical decision making (see chart for details).  Elderly female presents with her daughter after an episode of abdominal discomfort, loose stool and weakness.  On here the patient is awake and alert, describes generalized discomfort, but no pain.  Patient's evaluation here consistent with dehydration, otherwise generally reassuring. With no complaints, unremarkable vital signs, the patient received IV fluid resuscitation. Mild hyponatremia addressed. Patient will follow up with primary care for repeat lab evaluation.  Absent other complaints, patient appropriate for outpatient follow-up.  Final Clinical Impressions(s) / ED Diagnoses  Weakness Hyponatremia   Gerhard MunchLockwood, Ayza Ripoll, MD 06/09/17 2155

## 2017-06-10 ENCOUNTER — Observation Stay (HOSPITAL_COMMUNITY): Payer: PPO

## 2017-06-10 ENCOUNTER — Observation Stay (HOSPITAL_COMMUNITY)
Admission: AD | Admit: 2017-06-10 | Discharge: 2017-06-12 | Disposition: A | Discharge status: Home / Self Care | Payer: PPO | Source: Ambulatory Visit | Attending: Pulmonary Disease | Admitting: Pulmonary Disease

## 2017-06-10 ENCOUNTER — Encounter (HOSPITAL_COMMUNITY): Payer: Self-pay

## 2017-06-10 DIAGNOSIS — K633 Ulcer of intestine: Secondary | ICD-10-CM | POA: Diagnosis not present

## 2017-06-10 DIAGNOSIS — K219 Gastro-esophageal reflux disease without esophagitis: Secondary | ICD-10-CM | POA: Diagnosis not present

## 2017-06-10 DIAGNOSIS — E876 Hypokalemia: Secondary | ICD-10-CM | POA: Diagnosis not present

## 2017-06-10 DIAGNOSIS — I10 Essential (primary) hypertension: Secondary | ICD-10-CM | POA: Insufficient documentation

## 2017-06-10 DIAGNOSIS — K922 Gastrointestinal hemorrhage, unspecified: Secondary | ICD-10-CM | POA: Diagnosis present

## 2017-06-10 DIAGNOSIS — F0280 Dementia in other diseases classified elsewhere without behavioral disturbance: Secondary | ICD-10-CM | POA: Diagnosis not present

## 2017-06-10 DIAGNOSIS — K648 Other hemorrhoids: Secondary | ICD-10-CM | POA: Diagnosis not present

## 2017-06-10 DIAGNOSIS — F32A Depression, unspecified: Secondary | ICD-10-CM | POA: Diagnosis present

## 2017-06-10 DIAGNOSIS — Z90710 Acquired absence of both cervix and uterus: Secondary | ICD-10-CM | POA: Diagnosis not present

## 2017-06-10 DIAGNOSIS — Z7982 Long term (current) use of aspirin: Secondary | ICD-10-CM | POA: Insufficient documentation

## 2017-06-10 DIAGNOSIS — K644 Residual hemorrhoidal skin tags: Secondary | ICD-10-CM | POA: Insufficient documentation

## 2017-06-10 DIAGNOSIS — G250 Essential tremor: Secondary | ICD-10-CM | POA: Insufficient documentation

## 2017-06-10 DIAGNOSIS — K55039 Acute (reversible) ischemia of large intestine, extent unspecified: Secondary | ICD-10-CM

## 2017-06-10 DIAGNOSIS — I2510 Atherosclerotic heart disease of native coronary artery without angina pectoris: Secondary | ICD-10-CM | POA: Insufficient documentation

## 2017-06-10 DIAGNOSIS — G309 Alzheimer's disease, unspecified: Secondary | ICD-10-CM | POA: Insufficient documentation

## 2017-06-10 DIAGNOSIS — E785 Hyperlipidemia, unspecified: Secondary | ICD-10-CM | POA: Insufficient documentation

## 2017-06-10 DIAGNOSIS — Z79899 Other long term (current) drug therapy: Secondary | ICD-10-CM | POA: Diagnosis not present

## 2017-06-10 DIAGNOSIS — Z800 Family history of malignant neoplasm of digestive organs: Secondary | ICD-10-CM | POA: Diagnosis not present

## 2017-06-10 DIAGNOSIS — E039 Hypothyroidism, unspecified: Secondary | ICD-10-CM | POA: Diagnosis not present

## 2017-06-10 DIAGNOSIS — K5730 Diverticulosis of large intestine without perforation or abscess without bleeding: Secondary | ICD-10-CM | POA: Diagnosis not present

## 2017-06-10 DIAGNOSIS — F329 Major depressive disorder, single episode, unspecified: Secondary | ICD-10-CM | POA: Diagnosis not present

## 2017-06-10 DIAGNOSIS — R109 Unspecified abdominal pain: Secondary | ICD-10-CM | POA: Diagnosis not present

## 2017-06-10 DIAGNOSIS — K529 Noninfective gastroenteritis and colitis, unspecified: Secondary | ICD-10-CM | POA: Diagnosis not present

## 2017-06-10 HISTORY — DX: Hypothyroidism, unspecified: E03.9

## 2017-06-10 HISTORY — DX: Gastrointestinal hemorrhage, unspecified: K92.2

## 2017-06-10 LAB — CBC WITH DIFFERENTIAL/PLATELET
Basophils Absolute: 0 10*3/uL (ref 0.0–0.1)
Basophils Relative: 0 %
Eosinophils Absolute: 0.1 10*3/uL (ref 0.0–0.7)
Eosinophils Relative: 2 %
HCT: 34.8 % — ABNORMAL LOW (ref 36.0–46.0)
Hemoglobin: 12 g/dL (ref 12.0–15.0)
Lymphocytes Relative: 29 %
Lymphs Abs: 1.7 10*3/uL (ref 0.7–4.0)
MCH: 30.5 pg (ref 26.0–34.0)
MCHC: 34.5 g/dL (ref 30.0–36.0)
MCV: 88.3 fL (ref 78.0–100.0)
Monocytes Absolute: 0.5 10*3/uL (ref 0.1–1.0)
Monocytes Relative: 9 %
Neutro Abs: 3.6 10*3/uL (ref 1.7–7.7)
Neutrophils Relative %: 60 %
Platelets: 269 10*3/uL (ref 150–400)
RBC: 3.94 MIL/uL (ref 3.87–5.11)
RDW: 12.8 % (ref 11.5–15.5)
WBC: 5.9 10*3/uL (ref 4.0–10.5)

## 2017-06-10 LAB — COMPREHENSIVE METABOLIC PANEL
ALT: 38 U/L (ref 14–54)
AST: 18 U/L (ref 15–41)
Albumin: 3.8 g/dL (ref 3.5–5.0)
Alkaline Phosphatase: 101 U/L (ref 38–126)
Anion gap: 9 (ref 5–15)
BUN: 10 mg/dL (ref 6–20)
CO2: 26 mmol/L (ref 22–32)
Calcium: 9.3 mg/dL (ref 8.9–10.3)
Chloride: 101 mmol/L (ref 101–111)
Creatinine, Ser: 0.81 mg/dL (ref 0.44–1.00)
GFR calc Af Amer: >60 mL/min (ref >60)
GFR calc non Af Amer: >60 mL/min (ref >60)
Glucose, Bld: 104 mg/dL — ABNORMAL HIGH (ref 65–99)
Potassium: 3.9 mmol/L (ref 3.5–5.1)
Sodium: 136 mmol/L (ref 135–145)
Total Bilirubin: 1 mg/dL (ref 0.3–1.2)
Total Protein: 7.3 g/dL (ref 6.5–8.1)

## 2017-06-10 LAB — PROTIME-INR
INR: 0.93
Prothrombin Time: 12.4 seconds (ref 11.4–15.2)

## 2017-06-10 MED ORDER — LEVOTHYROXINE SODIUM 25 MCG PO TABS
25.0000 ug | ORAL_TABLET | Freq: Every day | ORAL | Status: DC
  Administered 2017-06-11 – 2017-06-12 (×2): 25 ug via ORAL
  Filled 2017-06-10 (×2): qty 1

## 2017-06-10 MED ORDER — PEG 3350-KCL-NA BICARB-NACL 420 G PO SOLR
2000.0000 mL | Freq: Once | ORAL | Status: AC
  Administered 2017-06-11: 2000 mL via ORAL

## 2017-06-10 MED ORDER — MEMANTINE HCL ER 7 MG PO CP24
21.0000 mg | ORAL_CAPSULE | Freq: Every day | ORAL | Status: DC
  Administered 2017-06-10 – 2017-06-11 (×2): 21 mg via ORAL
  Filled 2017-06-10 (×4): qty 3

## 2017-06-10 MED ORDER — IOPAMIDOL (ISOVUE-300) INJECTION 61%
100.0000 mL | Freq: Once | INTRAVENOUS | Status: AC | PRN
  Administered 2017-06-10: 100 mL via INTRAVENOUS

## 2017-06-10 MED ORDER — PANTOPRAZOLE SODIUM 40 MG IV SOLR
40.0000 mg | Freq: Two times a day (BID) | INTRAVENOUS | Status: DC
  Administered 2017-06-10 (×2): 40 mg via INTRAVENOUS
  Filled 2017-06-10 (×2): qty 40

## 2017-06-10 MED ORDER — SODIUM CHLORIDE 0.9 % IV SOLN: INTRAVENOUS | Status: DC

## 2017-06-10 MED ORDER — SODIUM CHLORIDE 0.9 % IV SOLN
INTRAVENOUS | Status: DC
  Administered 2017-06-10 – 2017-06-11 (×3): via INTRAVENOUS

## 2017-06-10 MED ORDER — PEG 3350-KCL-NA BICARB-NACL 420 G PO SOLR
2000.0000 mL | Freq: Once | ORAL | Status: AC
  Administered 2017-06-10: 2000 mL via ORAL
  Filled 2017-06-10: qty 4000

## 2017-06-10 MED ORDER — PRAVASTATIN SODIUM 40 MG PO TABS
40.0000 mg | ORAL_TABLET | Freq: Every day | ORAL | Status: DC
  Administered 2017-06-10 – 2017-06-12 (×3): 40 mg via ORAL
  Filled 2017-06-10 (×3): qty 1

## 2017-06-10 MED ORDER — VENLAFAXINE HCL ER 75 MG PO CP24
150.0000 mg | ORAL_CAPSULE | Freq: Every day | ORAL | Status: DC
  Administered 2017-06-11 – 2017-06-12 (×2): 150 mg via ORAL
  Filled 2017-06-10 (×2): qty 2

## 2017-06-10 MED ORDER — ACETAMINOPHEN 650 MG RE SUPP: 650.0000 mg | Freq: Four times a day (QID) | RECTAL | Status: DC | PRN

## 2017-06-10 MED ORDER — PROPRANOLOL HCL ER 80 MG PO CP24
80.0000 mg | ORAL_CAPSULE | Freq: Every day | ORAL | Status: DC
  Administered 2017-06-10 – 2017-06-11 (×2): 80 mg via ORAL
  Filled 2017-06-10 (×4): qty 1

## 2017-06-10 MED ORDER — ACETAMINOPHEN 325 MG PO TABS: 650.0000 mg | ORAL_TABLET | Freq: Four times a day (QID) | ORAL | Status: DC | PRN

## 2017-06-10 MED ORDER — ONDANSETRON HCL 4 MG/2ML IJ SOLN
4.0000 mg | Freq: Four times a day (QID) | INTRAMUSCULAR | Status: DC | PRN
  Administered 2017-06-10: 4 mg via INTRAVENOUS
  Filled 2017-06-10: qty 2

## 2017-06-10 MED ORDER — FENTANYL CITRATE (PF) 100 MCG/2ML IJ SOLN
12.5000 ug | INTRAMUSCULAR | Status: DC | PRN
  Administered 2017-06-10 – 2017-06-11 (×4): 12.5 ug via INTRAVENOUS
  Filled 2017-06-10 (×4): qty 2

## 2017-06-10 MED ORDER — ONDANSETRON HCL 4 MG PO TABS: 4.0000 mg | ORAL_TABLET | Freq: Four times a day (QID) | ORAL | Status: DC | PRN

## 2017-06-10 MED ORDER — IOPAMIDOL (ISOVUE-300) INJECTION 61%
INTRAVENOUS | Status: AC
  Administered 2017-06-10: 30 mL via ORAL
  Filled 2017-06-10: qty 30

## 2017-06-10 NOTE — H&P (Signed)
Full note to follow on the call in system. She came in with abdominal pain and rectal bleeding. She is weak. To have CT labs and GI consult.

## 2017-06-10 NOTE — Consult Note (Signed)
Referring Provider: Dr. Hawkins  Primary Care Physician:  Hawkins, Edward, MD Primary Gastroenterologist:  Dr. Fields   Date of Admission: 06/10/17 Date of Consultation: 06/10/17  Reason for Consultation:  Rectal bleeding   HPI:  Lori Bell is a 71 y.o. year old female who presented to the ED yesterday evening with abdominal pain, weakness, diaphoresis, and was sent home after IV fluids. Family is at bedside, stating she presented to Dr. Hawkins' office today and was sent to the ED for admission.   Feeling worse last 3 days. Yesterday felt like she needed to have a BM. Was diaphoretic, weak, had 1 BM that was diarrhea, then laid down on the bed. Went to Ed yesterday evening. 3am this morning started having rectal bleeding. Had pile of blood in toilet with passing flatus. Pain located almost in pelvis/suprapubic region. Pain started around 430 pm yesterday evening and constant since that time. In ED was hurting like a 4, but now at a 7. Not a complainer, per family. Feels cold but no fever. No NSAIDs. Had 2 tylenol in ED yesterday evening. Last episode of rectal bleeding around 7am. Brother died at age 61 from colon cancer. CT ordered for today, going downstairs at 5pm. Intermittent nausea started after drinking oral contrast for CT.   Med list needs to be reconciled in computer. Takes Protonix each day, generic. 81 mg aspirin daily. Denies anticoagulaton.  Colonoscopy 2003 by Dr. Magod: internal and external hemorrhoids, a few tiny hyperplastic-appearing polyps not biopsied. Possible history of personal polyps in remote past.   Past Medical History:  Diagnosis Date  . ASCVD (arteriosclerotic cardiovascular disease)   . Depression   . GERD (gastroesophageal reflux disease)   . Hyperlipidemia   . Hypertension   . Hypothyroidism     Past Surgical History:  Procedure Laterality Date  . BREAST BIOPSY  1997   Left  . COLONOSCOPY  2003   Dr. Magod: internal and external hemorrhoids, a  few tin hyperplastic-appearing polyps not biopsed  . INNER EAR SURGERY  1966   Right  . TOTAL ABDOMINAL HYSTERECTOMY W/ BILATERAL SALPINGOOPHORECTOMY  05/2009  . TUBAL LIGATION  1970    Prior to Admission medications   Medication Sig Start Date End Date Taking? Authorizing Provider  aspirin 81 MG tablet Take 81 mg by mouth at bedtime.     [provider]  Calcium Citrate-Vitamin D (CALCIUM CITRATE + PO) Take 500 mg by mouth 2 (two) times daily.     [provider]  desvenlafaxine (PRISTIQ) 50 MG 24 hr tablet Take 50 mg by mouth at bedtime.     [provider]  irbesartan-hydrochlorothiazide (AVALIDE) 150-12.5 MG tablet Take 1 tablet by mouth daily.    [provider]  levothyroxine (SYNTHROID, LEVOTHROID) 25 MCG tablet Take 25 mcg by mouth daily before breakfast.    [provider]  Memantine HCl ER (NAMENDA XR) 21 MG CP24 Take 1 tablet by mouth at bedtime.    [provider]  Omega-3 Fatty Acids (FISH OIL) 1000 MG CAPS Take 1,000 mg by mouth 2 (two) times daily.    [provider]  pravastatin (PRAVACHOL) 40 MG tablet Take 40 mg by mouth daily.    [provider]  propranolol ER (INDERAL LA) 80 MG 24 hr capsule Take 1 capsule (80 mg total) by mouth at bedtime. 04/24/16   Ahern, Antonia B, MD    Current Facility-Administered Medications  Medication Dose Route Frequency Provider Last Rate Last Dose  .   0.9 %  sodium chloride infusion   Intravenous Continuous Hawkins, Edward, MD 50 mL/hr at 06/10/17 1543    . acetaminophen (TYLENOL) tablet 650 mg  650 mg Oral Q6H PRN Hawkins, Edward, MD       Or  . acetaminophen (TYLENOL) suppository 650 mg  650 mg Rectal Q6H PRN Hawkins, Edward, MD      . [START ON 06/11/2017] levothyroxine (SYNTHROID, LEVOTHROID) tablet 25 mcg  25 mcg Oral QAC breakfast Hawkins, Edward, MD      . memantine (NAMENDA XR) 24 hr capsule 21 mg  21 mg Oral QHS Hawkins, Edward, MD      . ondansetron (ZOFRAN)  tablet 4 mg  4 mg Oral Q6H PRN Hawkins, Edward, MD       Or  . ondansetron (ZOFRAN) injection 4 mg  4 mg Intravenous Q6H PRN Hawkins, Edward, MD   4 mg at 06/10/17 1543  . pantoprazole (PROTONIX) injection 40 mg  40 mg Intravenous Q12H Hawkins, Edward, MD   40 mg at 06/10/17 1549  . pravastatin (PRAVACHOL) tablet 40 mg  40 mg Oral Daily Hawkins, Edward, MD      . propranolol ER (INDERAL LA) 24 hr capsule 80 mg  80 mg Oral QHS Hawkins, Edward, MD      . [START ON 06/11/2017] venlafaxine XR (EFFEXOR-XR) 24 hr capsule 150 mg  150 mg Oral Q breakfast Hawkins, Edward, MD        Allergies as of 06/10/2017  . (No Known Allergies)    Family History  Problem Relation Age of Onset  . Parkinsonism Father   . Coronary artery disease Other   . Cancer Brother   . Colon cancer Brother 61       deceased   . Dementia Brother   . Dementia Brother   . Dementia Sister     Social History   Social History  . Marital status: Widowed    Spouse name: N/A  . Number of children: 4  . Years of education: 9   Occupational History  . Retired      Delivering medications to pharmacies.    Social History Main Topics  . Smoking status: Never Smoker  . Smokeless tobacco: Never Used  . Alcohol use No  . Drug use: No  . Sexual activity: No   Other Topics Concern  . Not on file   Social History Narrative   Lives at home by self.   Caffeine use: 1 cup coffee/day    Exercises regularly    Review of Systems: Gen: see HPI  CV: occasional chest pain  Resp: Denies shortness of breath with rest, cough, wheezing GI: see HPI  GU : Denies urinary burning, urinary frequency, urinary incontinence.  MS: Denies joint pain,swelling, cramping Derm: Denies rash, itching, dry skin Psych: Denies depression, anxiety,confusion, or memory loss Heme: see HPI   Physical Exam: Vital signs in last 24 hours: Temp:  [97.7 F (36.5 C)-98.1 F (36.7 C)] 98.1 F (36.7 C) (10/22 1400) Pulse Rate:  [56-70] 63 (10/22  1400) Resp:  [16-19] 16 (10/22 1400) BP: (123-139)/(65-80) 139/73 (10/22 1400) SpO2:  [93 %-100 %] 97 % (10/22 1400) Weight:  [155 lb 12.8 oz (70.7 kg)-160 lb (72.6 kg)] 155 lb 12.8 oz (70.7 kg) (10/22 1400)   General:   Alert,  Well-developed, well-nourished, pleasant and cooperative in NAD Head:  Normocephalic and atraumatic. Eyes:  Sclera clear, no icterus.   Conjunctiva pink. Ears:  Normal auditory acuity. Nose:  No deformity,   discharge,  or lesions. Mouth:  No deformity or lesions, dentition normal. Lungs:  Clear throughout to auscultation.    Heart:  Regular rate and rhythm Abdomen:  Soft, moderately TTP LLQ/suprapubic, and RLQ. Non-distended. No masses, hepatosplenomegaly or hernias noted. Normal bowel sounds, without guarding, and without rebound.   Rectal:  Deferred until time of colonoscopy.   Msk:  Symmetrical without gross deformities. Normal posture. Extremities:  Without  edema. Neurologic:  Alert and  oriented x4 Psych:  Alert and cooperative. Normal mood and affect.  Intake/Output from previous day: No intake/output data recorded. Intake/Output this shift: No intake/output data recorded.  Lab Results:  Recent Labs  06/09/17 1920 06/10/17 1448  WBC 8.4 5.9  HGB 13.5 12.0  HCT 39.0 34.8*  PLT 301 269   BMET  Recent Labs  06/09/17 1920  NA 127*  K 2.9*  CL 92*  CO2 24  GLUCOSE 145*  BUN 15  CREATININE 1.05*  CALCIUM 9.3   LFT  Recent Labs  06/09/17 1920  PROT 8.0  ALBUMIN 4.2  AST 26  ALT 45  ALKPHOS 116  BILITOT 0.9    Studies/Results: CT abd/pelvis with contrast ordered for this evening    Impression: 71-year-old female admitted with abdominal pain, rectal bleeding, with clinical presentation suspicious for ischemic colitis. CT abd/pelvis ordered for this evening by attending. Abdominal pain still present, with last episode of rectal bleeding this morning. Hypokalemia, hyponatremia noted on labs yesterday in the ED, with repeat CMP  pending. Last colonoscopy in 2003 by Dr. Magod with polyps non-manipulated. As of note, patient's brother passed away from colon cancer at age 61.   CT abd/pelvis as ordered per attending. Will prep for colonoscopy this evening and plan on colonoscopy with Propofol by Dr. Fields on 06/11/17.   Hypokalemia: replace potassium as needed.   Plan: CT abd/pelvis this evening Prep ordered for colonoscopy to begin this evening Clear liquids, NPO after midnight Replace potassium as needed: CMP pending Colonoscopy 10/23 with Propofol by Dr. Fields: risks, benefits, alternatives discussed in detail with patient and multiple family members at bedside.   Anna W. Boone, PhD, ANP-BC [facility]      LOS: 0 days    06/10/2017, 4:26 PM    

## 2017-06-11 ENCOUNTER — Encounter (HOSPITAL_COMMUNITY): Payer: Self-pay | Admitting: *Deleted

## 2017-06-11 ENCOUNTER — Observation Stay (HOSPITAL_COMMUNITY): Payer: PPO | Admitting: Anesthesiology

## 2017-06-11 ENCOUNTER — Encounter (HOSPITAL_COMMUNITY)
Admission: AD | Disposition: A | Discharge status: Home / Self Care | Payer: Self-pay | Source: Ambulatory Visit | Attending: Pulmonary Disease

## 2017-06-11 DIAGNOSIS — K55049 Acute infarction of large intestine, extent unspecified: Secondary | ICD-10-CM | POA: Diagnosis not present

## 2017-06-11 DIAGNOSIS — G250 Essential tremor: Secondary | ICD-10-CM | POA: Diagnosis not present

## 2017-06-11 DIAGNOSIS — K625 Hemorrhage of anus and rectum: Secondary | ICD-10-CM | POA: Diagnosis not present

## 2017-06-11 DIAGNOSIS — G309 Alzheimer's disease, unspecified: Secondary | ICD-10-CM

## 2017-06-11 DIAGNOSIS — K633 Ulcer of intestine: Secondary | ICD-10-CM

## 2017-06-11 DIAGNOSIS — K5730 Diverticulosis of large intestine without perforation or abscess without bleeding: Secondary | ICD-10-CM | POA: Diagnosis not present

## 2017-06-11 DIAGNOSIS — K644 Residual hemorrhoidal skin tags: Secondary | ICD-10-CM | POA: Diagnosis not present

## 2017-06-11 DIAGNOSIS — F329 Major depressive disorder, single episode, unspecified: Secondary | ICD-10-CM | POA: Diagnosis not present

## 2017-06-11 DIAGNOSIS — Z800 Family history of malignant neoplasm of digestive organs: Secondary | ICD-10-CM | POA: Diagnosis not present

## 2017-06-11 DIAGNOSIS — R1032 Left lower quadrant pain: Secondary | ICD-10-CM | POA: Diagnosis not present

## 2017-06-11 DIAGNOSIS — E876 Hypokalemia: Secondary | ICD-10-CM | POA: Diagnosis not present

## 2017-06-11 DIAGNOSIS — F0280 Dementia in other diseases classified elsewhere without behavioral disturbance: Secondary | ICD-10-CM | POA: Diagnosis present

## 2017-06-11 DIAGNOSIS — K529 Noninfective gastroenteritis and colitis, unspecified: Secondary | ICD-10-CM | POA: Diagnosis not present

## 2017-06-11 DIAGNOSIS — K55039 Acute (reversible) ischemia of large intestine, extent unspecified: Secondary | ICD-10-CM | POA: Diagnosis not present

## 2017-06-11 DIAGNOSIS — E039 Hypothyroidism, unspecified: Secondary | ICD-10-CM | POA: Diagnosis not present

## 2017-06-11 DIAGNOSIS — I10 Essential (primary) hypertension: Secondary | ICD-10-CM | POA: Diagnosis not present

## 2017-06-11 DIAGNOSIS — K648 Other hemorrhoids: Secondary | ICD-10-CM | POA: Diagnosis not present

## 2017-06-11 DIAGNOSIS — F32A Depression, unspecified: Secondary | ICD-10-CM | POA: Diagnosis present

## 2017-06-11 HISTORY — PX: BIOPSY: SHX5522

## 2017-06-11 HISTORY — PX: COLONOSCOPY WITH PROPOFOL: SHX5780

## 2017-06-11 LAB — BASIC METABOLIC PANEL
Anion gap: 7 (ref 5–15)
BUN: 7 mg/dL (ref 6–20)
CO2: 27 mmol/L (ref 22–32)
Calcium: 8.7 mg/dL — ABNORMAL LOW (ref 8.9–10.3)
Chloride: 99 mmol/L — ABNORMAL LOW (ref 101–111)
Creatinine, Ser: 0.84 mg/dL (ref 0.44–1.00)
GFR calc Af Amer: >60 mL/min (ref >60)
GFR calc non Af Amer: >60 mL/min (ref >60)
Glucose, Bld: 114 mg/dL — ABNORMAL HIGH (ref 65–99)
Potassium: 3 mmol/L — ABNORMAL LOW (ref 3.5–5.1)
Sodium: 133 mmol/L — ABNORMAL LOW (ref 135–145)

## 2017-06-11 LAB — CBC
HCT: 34.3 % — ABNORMAL LOW (ref 36.0–46.0)
Hemoglobin: 11.5 g/dL — ABNORMAL LOW (ref 12.0–15.0)
MCH: 30 pg (ref 26.0–34.0)
MCHC: 33.5 g/dL (ref 30.0–36.0)
MCV: 89.6 fL (ref 78.0–100.0)
Platelets: 271 10*3/uL (ref 150–400)
RBC: 3.83 MIL/uL — ABNORMAL LOW (ref 3.87–5.11)
RDW: 12.7 % (ref 11.5–15.5)
WBC: 4.9 10*3/uL (ref 4.0–10.5)

## 2017-06-11 LAB — HM COLONOSCOPY

## 2017-06-11 SURGERY — COLONOSCOPY WITH PROPOFOL
Anesthesia: Monitor Anesthesia Care

## 2017-06-11 MED ORDER — LACTATED RINGERS IV SOLN
INTRAVENOUS | Status: DC
  Administered 2017-06-11: 10:00:00 via INTRAVENOUS

## 2017-06-11 MED ORDER — CIPROFLOXACIN IN D5W 400 MG/200ML IV SOLN: 400.0000 mg | Freq: Two times a day (BID) | INTRAVENOUS | Status: DC

## 2017-06-11 MED ORDER — PANTOPRAZOLE SODIUM 40 MG PO TBEC
40.0000 mg | DELAYED_RELEASE_TABLET | Freq: Every day | ORAL | Status: DC
  Administered 2017-06-12: 40 mg via ORAL
  Filled 2017-06-11 (×2): qty 1

## 2017-06-11 MED ORDER — ONDANSETRON 4 MG PO TBDP
ORAL_TABLET | ORAL | Status: AC
  Filled 2017-06-11: qty 1

## 2017-06-11 MED ORDER — PROPOFOL 500 MG/50ML IV EMUL
INTRAVENOUS | Status: DC | PRN
  Administered 2017-06-11: 150 ug/kg/min via INTRAVENOUS

## 2017-06-11 MED ORDER — POTASSIUM CHLORIDE 10 MEQ/100ML IV SOLN
10.0000 meq | INTRAVENOUS | Status: AC
  Administered 2017-06-11 (×4): 10 meq via INTRAVENOUS
  Filled 2017-06-11 (×4): qty 100

## 2017-06-11 MED ORDER — METRONIDAZOLE IN NACL 5-0.79 MG/ML-% IV SOLN: 500.0000 mg | Freq: Four times a day (QID) | INTRAVENOUS | Status: DC

## 2017-06-11 MED ORDER — ASPIRIN EC 81 MG PO TBEC
81.0000 mg | DELAYED_RELEASE_TABLET | Freq: Every day | ORAL | Status: DC
  Administered 2017-06-12: 81 mg via ORAL
  Filled 2017-06-11 (×2): qty 1

## 2017-06-11 MED ORDER — ONDANSETRON 4 MG PO TBDP
4.0000 mg | ORAL_TABLET | Freq: Once | ORAL | Status: AC
  Administered 2017-06-11: 4 mg via ORAL

## 2017-06-11 NOTE — H&P (Signed)
Lori Bell MRN: [id number] DOB/AGE: Dec 14, 1945 71 y.o. Primary Care Physician:Camaryn Lumbert, Ramon Dredge, MD Admit date: 06/10/2017 Chief Complaint: Abdominal pain HPI: This is a 71 year old who has been having abdominal pain. She had been in the emergency department on 06/09/2017 with what was thought to be a urinary tract infection and she was having abdominal pain then but did not tell the emergency room physician. She came to my office on the 22nd with continued abdominal pain not eating anything severe weakness and with GI bleeding. Exam showed that she had tenderness of the abdomen and she appeared to be very weak. She did not have any chest pain. No cardiac arrhythmias. No fever. No diarrhea. No vomiting. She has significant problems with dementia on medication which seems to have helped and with depression. She had been having some urinary discomfort but that's better. This is documentation of history and physical done in my office on 06/10/2017  Past Medical History:  Diagnosis Date  . ASCVD (arteriosclerotic cardiovascular disease)   . Depression   . GERD (gastroesophageal reflux disease)   . Hyperlipidemia   . Hypertension   . Hypothyroidism    Past Surgical History:  Procedure Laterality Date  . BREAST BIOPSY  1997   Left  . COLONOSCOPY  2003   Dr. Ewing Schlein: internal and external hemorrhoids, a few tin hyperplastic-appearing polyps not biopsed  . INNER EAR SURGERY  1966   Right  . TOTAL ABDOMINAL HYSTERECTOMY W/ BILATERAL SALPINGOOPHORECTOMY  05/2009  . TUBAL LIGATION  1970        Family History  Problem Relation Age of Onset  . Parkinsonism Father   . Coronary artery disease Other   . Cancer Brother   . Colon cancer Brother 85       deceased   . Dementia Brother   . Dementia Brother   . Dementia Sister     Social History:  reports that she has never smoked. She has never used smokeless tobacco. She reports that she does not drink alcohol or use drugs.   Allergies: No  Known Allergies  Medications Prior to Admission  Medication Sig Dispense Refill  . aspirin 81 MG tablet Take 81 mg by mouth at bedtime.     . Calcium Citrate-Vitamin D (CALCIUM CITRATE + PO) Take 500 mg by mouth 2 (two) times daily.     Marland Kitchen desvenlafaxine (PRISTIQ) 50 MG 24 hr tablet Take 50 mg by mouth at bedtime.     . irbesartan-hydrochlorothiazide (AVALIDE) 150-12.5 MG tablet Take 1 tablet by mouth daily.    Marland Kitchen levothyroxine (SYNTHROID, LEVOTHROID) 25 MCG tablet Take 25 mcg by mouth daily before breakfast.    . Memantine HCl ER (NAMENDA XR) 21 MG CP24 Take 1 tablet by mouth at bedtime.    . Omega-3 Fatty Acids (FISH OIL) 1000 MG CAPS Take 1,000 mg by mouth 2 (two) times daily.    . pravastatin (PRAVACHOL) 40 MG tablet Take 40 mg by mouth daily.    . propranolol ER (INDERAL LA) 80 MG 24 hr capsule Take 1 capsule (80 mg total) by mouth at bedtime. 90 capsule 4       WUJ:WJXBJ from the symptoms mentioned above,there are no other symptoms referable to all systems reviewed.10 point review of systems is otherwise negative  Physical Exam: Blood pressure (!) 108/55, pulse 63, temperature 98.2 F (36.8 C), temperature source Oral, resp. rate 16, height 5\' 5"  (1.651 m), weight 71.6 kg (157 lb 12.8 oz), SpO2 94 %. Constitutional: She is  awake and alert but looks acutely sick and very weak. Eyes: Pupils react. EOMI. Ears nose mouth and throat: Her mucous membranes are mildly dry. Hearing is decreased. Cardiovascular: Her heart is regular with normal heart sounds. No edema. Respiratory: Her respiratory effort is normal. Her lungs are clear. Gastrointestinal: Her abdomen is soft but tender in the epigastric region and in the left lower quadrant.skin: warm and dry. Psychiatric: She is depressed neurological: She still has some mild confusion    Recent Labs  06/09/17 1920 06/10/17 1448 06/11/17 0607  WBC 8.4 5.9 4.9  NEUTROABS 6.3 3.6  --   HGB 13.5 12.0 11.5*  HCT 39.0 34.8* 34.3*  MCV 87.6  88.3 89.6  PLT 301 269 271    Recent Labs  06/10/17 1448 06/11/17 0802  NA 136 133*  K 3.9 3.0*  CL 101 99*  CO2 26 27  GLUCOSE 104* 114*  BUN 10 7  CREATININE 0.81 0.84  CALCIUM 9.3 8.7*  lablast2(ast:2,ALT:2,alkphos:2,bilitot:2,prot:2,albumin:2)@    No results found for this or any previous visit (from the past 240 hour(s)).   Ct Abdomen Pelvis W Contrast  Result Date: 06/10/2017 CLINICAL DATA:  Abdominal pain and weakness.  Rectal bleeding. EXAM: CT ABDOMEN AND PELVIS WITH CONTRAST TECHNIQUE: Multidetector CT imaging of the abdomen and pelvis was performed using the standard protocol following bolus administration of intravenous contrast. CONTRAST:  30mL ISOVUE-300 IOPAMIDOL (ISOVUE-300) INJECTION 61%, ISOVUE-300 IOPAMIDOL (ISOVUE-300) INJECTION 61% COMPARISON:  02/02/2004 FINDINGS: Lower chest: Atelectasis or scarring noted in the lingula. Coronary artery calcification is evident. Hepatobiliary: Small area of low attenuation in the anterior liver, adjacent to the falciform ligament, is in a characteristic location for focal fatty deposition. There is no evidence for gallstones, gallbladder wall thickening, or pericholecystic fluid. No intrahepatic or extrahepatic biliary dilation. Pancreas: No focal mass lesion. No dilatation of the main duct. No intraparenchymal cyst. No peripancreatic edema. Spleen: No splenomegaly. No focal mass lesion. Adrenals/Urinary Tract: 2.9 cm right adrenal lesion increased minimally from 2.2 cm on the study of 13 years ago and is most compatible with an adenoma. Left adrenal gland unremarkable. Kidneys unremarkable. No evidence for hydroureter. The urinary bladder appears normal for the degree of distention. Stomach/Bowel: Moderate hiatal hernia. Duodenum is normally positioned as is the ligament of Treitz. No small bowel wall thickening. No small bowel dilatation. The terminal ileum is normal. The appendix is normal. Right and transverse segments of the  colon are normal. There is a short segment of circumferential wall thickening in the splenic flexure (image 27 series 2 and image 82 sagittal series 5). Additionally, almost the entire descending segment of the colon shows circumferential wall thickening and edema with subtle pericolonic edema/inflammation. Sigmoid colon and rectum are unremarkable. Vascular/Lymphatic: There is abdominal aortic atherosclerosis without aneurysm. Celiac axis and SMA are patent. Portal vein and superior mesenteric vein are patent. The inferior mesenteric artery opacifies. There is no gastrohepatic or hepatoduodenal ligament lymphadenopathy. No intraperitoneal or retroperitoneal lymphadenopathy. No pelvic sidewall lymphadenopathy. Reproductive: Uterus surgically absent.  There is no adnexal mass. Other: No intraperitoneal free fluid. Musculoskeletal: Bone windows reveal no worrisome lytic or sclerotic osseous lesions. IMPRESSION: 1. Relatively long segment of left colon, involving the entire descending segment, shows circumferential wall thickening and edema with pericolonic stranding. Imaging features likely related to infectious/inflammatory colitis. While ischemic colitis cannot be excluded, the IMA is opacified. 2. Short segment of circumferential wall thickening in the splenic flexure the colon. On coronal imaging this appears to the maintain haustral  anatomy which would make circumferential neoplasia less likely, but correlation with colorectal cancer screening history recommended. 3. 2.9 cm right adrenal nodule increased slightly from 2.2 cm on the study of 13 years ago. Imaging features most compatible benign etiology such is adenoma. 4.  Aortic Atherosclerois (ICD10-170.0) Electronically Signed   By: Kennith CenterEric  Mansell M.D.   On: 06/10/2017 19:21   Impression: She was admitted with abdominal discomfort and GI bleeding. Active Problems:   Cardiovascular disease   GASTROESOPHAGEAL REFLUX DISEASE   Essential tremor   GI  bleeding   Dementia due to Alzheimer's disease   Depression     Plan: Check blood. CT of the abdomen. GI consult.      Citlaly Camplin L   06/11/2017, 8:55 AM

## 2017-06-11 NOTE — Anesthesia Preprocedure Evaluation (Addendum)
Anesthesia Evaluation  Patient identified by MRN, date of birth, ID band Patient awake    Airway Mallampati: I       Dental  (+) Edentulous Upper, Poor Dentition, Partial Lower   Pulmonary    Pulmonary exam normal        Cardiovascular hypertension, Pt. on medications Normal cardiovascular exam Rhythm:Regular Rate:Normal     Neuro/Psych Depression    GI/Hepatic GERD  Medicated,  Endo/Other  Hypothyroidism   Renal/GU      Musculoskeletal   Abdominal Normal abdominal exam  (+)   Peds  Hematology   Anesthesia Other Findings   Reproductive/Obstetrics                            Anesthesia Physical Anesthesia Plan  ASA: III and emergent  Anesthesia Plan: MAC   Post-op Pain Management:    Induction:   PONV Risk Score and Plan:   Airway Management Planned: Simple Face Mask  Additional Equipment:   Intra-op Plan:   Post-operative Plan:   Informed Consent: I have reviewed the patients History and Physical, chart, labs and discussed the procedure including the risks, benefits and alternatives for the proposed anesthesia with the patient or authorized representative who has indicated his/her understanding and acceptance.   Dental advisory given  Plan Discussed with:   Anesthesia Plan Comments:         Anesthesia Quick Evaluation

## 2017-06-11 NOTE — Interval H&P Note (Signed)
History and Physical Interval Note:  06/11/2017 10:04 AM  Lori Bell  has presented today for surgery, with the diagnosis of rectal bleeding  The various methods of treatment have been discussed with the patient and family. After consideration of risks, benefits and other options for treatment, the patient has consented to  Procedure(s): COLONOSCOPY WITH PROPOFOL (N/A) as a surgical intervention .  The patient's history has been reviewed, patient examined, no change in status, stable for surgery.  I have reviewed the patient's chart and labs.  Questions were answered to the patient's satisfaction.     Eaton CorporationSandi Khiem Gargis

## 2017-06-11 NOTE — Anesthesia Postprocedure Evaluation (Signed)
Anesthesia Post Note  Patient: Lori Bell  Procedure(s) Performed: COLONOSCOPY WITH PROPOFOL (N/A ) BIOPSY  Patient location during evaluation: PACU Anesthesia Type: MAC Level of consciousness: awake and alert, oriented and patient cooperative Pain management: pain level controlled Vital Signs Assessment: post-procedure vital signs reviewed and stable Respiratory status: spontaneous breathing, respiratory function stable and patient connected to nasal cannula oxygen Cardiovascular status: stable Postop Assessment: no apparent nausea or vomiting Anesthetic complications: no     Last Vitals:  Vitals:   06/11/17 0500 06/11/17 0959  BP: (!) 108/55 (!) 151/78  Pulse: 63 60  Resp: 16 16  Temp: 36.8 C 36.8 C  SpO2: 94% 98%    Last Pain:  Vitals:   06/11/17 0959  TempSrc: Oral  PainSc:                  ADAMS, AMY A

## 2017-06-11 NOTE — H&P (View-Only) (Signed)
Referring Provider: Dr. Juanetta GoslingHawkins  Primary Care Physician:  Kari BaarsHawkins, Edward, MD Primary Gastroenterologist:  Dr. Darrick PennaFields   Date of Admission: 06/10/17 Date of Consultation: 06/10/17  Reason for Consultation:  Rectal bleeding   HPI:  Lori Bell is a 71 y.o. year old female who presented to the ED yesterday evening with abdominal pain, weakness, diaphoresis, and was sent home after IV fluids. Family is at bedside, stating she presented to Dr. Juanetta GoslingHawkins' office today and was sent to the ED for admission.   Feeling worse last 3 days. Yesterday felt like she needed to have a BM. Was diaphoretic, weak, had 1 BM that was diarrhea, then laid down on the bed. Went to Ed yesterday evening. 3am this morning started having rectal bleeding. Had pile of blood in toilet with passing flatus. Pain located almost in pelvis/suprapubic region. Pain started around 430 pm yesterday evening and constant since that time. In ED was hurting like a 4, but now at a 7. Not a complainer, per family. Feels cold but no fever. No NSAIDs. Had 2 tylenol in ED yesterday evening. Last episode of rectal bleeding around 7am. Brother died at age 72 from colon cancer. CT ordered for today, going downstairs at 5pm. Intermittent nausea started after drinking oral contrast for CT.   Med list needs to be reconciled in computer. Takes Protonix each day, generic. 81 mg aspirin daily. Denies anticoagulaton.  Colonoscopy 2003 by Dr. Ewing SchleinMagod: internal and external hemorrhoids, a few tiny hyperplastic-appearing polyps not biopsied. Possible history of personal polyps in remote past.   Past Medical History:  Diagnosis Date  . ASCVD (arteriosclerotic cardiovascular disease)   . Depression   . GERD (gastroesophageal reflux disease)   . Hyperlipidemia   . Hypertension   . Hypothyroidism     Past Surgical History:  Procedure Laterality Date  . BREAST BIOPSY  1997   Left  . COLONOSCOPY  2003   Dr. Ewing SchleinMagod: internal and external hemorrhoids, a  few tin hyperplastic-appearing polyps not biopsed  . INNER EAR SURGERY  1966   Right  . TOTAL ABDOMINAL HYSTERECTOMY W/ BILATERAL SALPINGOOPHORECTOMY  05/2009  . TUBAL LIGATION  1970    Prior to Admission medications   Medication Sig Start Date End Date Taking? Authorizing Provider  aspirin 81 MG tablet Take 81 mg by mouth at bedtime.     [provider]  Calcium Citrate-Vitamin D (CALCIUM CITRATE + PO) Take 500 mg by mouth 2 (two) times daily.     [provider]  desvenlafaxine (PRISTIQ) 50 MG 24 hr tablet Take 50 mg by mouth at bedtime.     [provider]  irbesartan-hydrochlorothiazide (AVALIDE) 150-12.5 MG tablet Take 1 tablet by mouth daily.    [provider]  levothyroxine (SYNTHROID, LEVOTHROID) 25 MCG tablet Take 25 mcg by mouth daily before breakfast.    [provider]  Memantine HCl ER (NAMENDA XR) 21 MG CP24 Take 1 tablet by mouth at bedtime.    [provider]  Omega-3 Fatty Acids (FISH OIL) 1000 MG CAPS Take 1,000 mg by mouth 2 (two) times daily.    [provider]  pravastatin (PRAVACHOL) 40 MG tablet Take 40 mg by mouth daily.    [provider]  propranolol ER (INDERAL LA) 80 MG 24 hr capsule Take 1 capsule (80 mg total) by mouth at bedtime. 04/24/16   Anson FretAhern, Antonia B, MD    Current Facility-Administered Medications  Medication Dose Route Frequency Provider Last Rate Last Dose  .  0.9 %  sodium chloride infusion   Intravenous Continuous Kari Baars, MD 50 mL/hr at 06/10/17 1543    . acetaminophen (TYLENOL) tablet 650 mg  650 mg Oral Q6H PRN Kari Baars, MD       Or  . acetaminophen (TYLENOL) suppository 650 mg  650 mg Rectal Q6H PRN Kari Baars, MD      . Melene Muller ON 06/11/2017] levothyroxine (SYNTHROID, LEVOTHROID) tablet 25 mcg  25 mcg Oral QAC breakfast Kari Baars, MD      . memantine (NAMENDA XR) 24 hr capsule 21 mg  21 mg Oral QHS Kari Baars, MD      . ondansetron [facility])  tablet 4 mg  4 mg Oral Q6H PRN Kari Baars, MD       Or  . ondansetron [facility]) injection 4 mg  4 mg Intravenous Q6H PRN Kari Baars, MD   4 mg at 06/10/17 1543  . pantoprazole (PROTONIX) injection 40 mg  40 mg Intravenous Thomasene Mohair, MD   40 mg at 06/10/17 1549  . pravastatin (PRAVACHOL) tablet 40 mg  40 mg Oral Daily Kari Baars, MD      . propranolol ER (INDERAL LA) 24 hr capsule 80 mg  80 mg Oral QHS Kari Baars, MD      . Melene Muller ON 06/11/2017] venlafaxine XR (EFFEXOR-XR) 24 hr capsule 150 mg  150 mg Oral Q breakfast Kari Baars, MD        Allergies as of 06/10/2017  . (No Known Allergies)    Family History  Problem Relation Age of Onset  . Parkinsonism Father   . Coronary artery disease Other   . Cancer Brother   . Colon cancer Brother 59       deceased   . Dementia Brother   . Dementia Brother   . Dementia Sister     Social History   Social History  . Marital status: Widowed    Spouse name: N/A  . Number of children: 4  . Years of education: 9   Occupational History  . Retired      Delivering medications to pharmacies.    Social History Main Topics  . Smoking status: Never Smoker  . Smokeless tobacco: Never Used  . Alcohol use No  . Drug use: No  . Sexual activity: No   Other Topics Concern  . Not on file   Social History Narrative   Lives at home by self.   Caffeine use: 1 cup coffee/day    Exercises regularly    Review of Systems: Gen: see HPI  CV: occasional chest pain  Resp: Denies shortness of breath with rest, cough, wheezing GI: see HPI  GU : Denies urinary burning, urinary frequency, urinary incontinence.  MS: Denies joint pain,swelling, cramping Derm: Denies rash, itching, dry skin Psych: Denies depression, anxiety,confusion, or memory loss Heme: see HPI   Physical Exam: Vital signs in last 24 hours: Temp:  [97.7 F (36.5 C)-98.1 F (36.7 C)] 98.1 F (36.7 C) (10/22 1400) Pulse Rate:  [56-70] 63 (10/22  1400) Resp:  [16-19] 16 (10/22 1400) BP: (123-139)/(65-80) 139/73 (10/22 1400) SpO2:  [93 %-100 %] 97 % (10/22 1400) Weight:  [155 lb 12.8 oz (70.7 kg)-160 lb (72.6 kg)] 155 lb 12.8 oz (70.7 kg) (10/22 1400)   General:   Alert,  Well-developed, well-nourished, pleasant and cooperative in NAD Head:  Normocephalic and atraumatic. Eyes:  Sclera clear, no icterus.   Conjunctiva pink. Ears:  Normal auditory acuity. Nose:  No deformity,  discharge,  or lesions. Mouth:  No deformity or lesions, dentition normal. Lungs:  Clear throughout to auscultation.    Heart:  Regular rate and rhythm Abdomen:  Soft, moderately TTP LLQ/suprapubic, and RLQ. Non-distended. No masses, hepatosplenomegaly or hernias noted. Normal bowel sounds, without guarding, and without rebound.   Rectal:  Deferred until time of colonoscopy.   Msk:  Symmetrical without gross deformities. Normal posture. Extremities:  Without  edema. Neurologic:  Alert and  oriented x4 Psych:  Alert and cooperative. Normal mood and affect.  Intake/Output from previous day: No intake/output data recorded. Intake/Output this shift: No intake/output data recorded.  Lab Results:  Recent Labs  06/09/17 1920 06/10/17 1448  WBC 8.4 5.9  HGB 13.5 12.0  HCT 39.0 34.8*  PLT 301 269   BMET  Recent Labs  06/09/17 1920  NA 127*  K 2.9*  CL 92*  CO2 24  GLUCOSE 145*  BUN 15  CREATININE 1.05*  CALCIUM 9.3   LFT  Recent Labs  06/09/17 1920  PROT 8.0  ALBUMIN 4.2  AST 26  ALT 45  ALKPHOS 116  BILITOT 0.9    Studies/Results: CT abd/pelvis with contrast ordered for this evening    Impression: 71 year old female admitted with abdominal pain, rectal bleeding, with clinical presentation suspicious for ischemic colitis. CT abd/pelvis ordered for this evening by attending. Abdominal pain still present, with last episode of rectal bleeding this morning. Hypokalemia, hyponatremia noted on labs yesterday in the ED, with repeat CMP  pending. Last colonoscopy in 2003 by Dr. Ewing Schlein with polyps non-manipulated. As of note, patient's brother passed away from colon cancer at age 50.   CT abd/pelvis as ordered per attending. Will prep for colonoscopy this evening and plan on colonoscopy with Propofol by Dr. Darrick Penna on 06/11/17.   Hypokalemia: replace potassium as needed.   Plan: CT abd/pelvis this evening Prep ordered for colonoscopy to begin this evening Clear liquids, NPO after midnight Replace potassium as needed: CMP pending Colonoscopy 10/23 with Propofol by Dr. Darrick Penna: risks, benefits, alternatives discussed in detail with patient and multiple family members at bedside.   Gelene Mink, PhD, ANP-BC [facility]      LOS: 0 days    06/10/2017, 4:26 PM

## 2017-06-11 NOTE — Progress Notes (Signed)
Potassium 3.9 yesterday, mildly low at 3.0 this morning. Completed bowel prep and tap water enemas successfully. No further rectal bleeding noted. Colonoscopy planned for mid morning with Propofol. Hgb just mildly decreased from yesterday at 11.5, with Hgb 13.5 two days ago when presenting to ED.    Discussed with nursing staff and will go ahead and give first dose of  potassium IV (as she is NPO), prior to going downstairs for procedure. Orders placed for potassium supplementation. Discussed with anesthesia as well; this does not pose any issues for upcoming procedure scheduled mid morning.

## 2017-06-11 NOTE — Transfer of Care (Signed)
Immediate Anesthesia Transfer of Care Note  Patient: Lori Bell  Procedure(s) Performed: COLONOSCOPY WITH PROPOFOL (N/A ) BIOPSY  Patient Location: PACU  Anesthesia Type:MAC  Level of Consciousness: awake, alert , oriented and patient cooperative  Airway & Oxygen Therapy: Patient Spontanous Breathing and Patient connected to nasal cannula oxygen  Post-op Assessment: Report given to RN and Post -op Vital signs reviewed and stable  Post vital signs: Reviewed and stable  Last Vitals:  Vitals:   06/11/17 0500 06/11/17 0959  BP: (!) 108/55 (!) 151/78  Pulse: 63 60  Resp: 16 16  Temp: 36.8 C 36.8 C  SpO2: 94% 98%    Last Pain:  Vitals:   06/11/17 0959  TempSrc: Oral  PainSc:       Patients Stated Pain Goal: 5 (09/98/33 8250)  Complications: No apparent anesthesia complications

## 2017-06-11 NOTE — Addendum Note (Signed)
Addendum  created 06/11/17 1153 by Earleen NewportAdams, Jaylina Ramdass A, CRNA   Charge Capture section accepted

## 2017-06-11 NOTE — Care Management Note (Signed)
Case Management Note  Patient Details  Name: Lori Bell MRN: [id number] Date of Birth: 06/05/1946  Subjective/Objective:                 Observation for GIB. TCS today. CM to bedside to deliver MOON. Pt's dtr at bedside. Pt from home, lives alone, ind with ADL's. She has PCP, does not drives, one of her three dtr's provides transportation to appointments. She uses cane with ambulation as needed. Her dtr fixes medications out for her weekly. No DC needs/concerns communicated by pt or dtr.    Action/Plan: DC home with self care.   Expected Discharge Date:                  Expected Discharge Plan:  Home/Self Care  In-House Referral:  NA  Discharge planning Services  CM Consult  Post Acute Care Choice:  NA Choice offered to:  NA  Status of Service:  Completed, signed off  Malcolm MetroChildress, Alberta Cairns Demske, RN 06/11/2017, 11:37 AM

## 2017-06-11 NOTE — Anesthesia Procedure Notes (Signed)
Procedure Name: MAC Performed by: ADAMS, AMY A Pre-anesthesia Checklist: Patient identified, Suction available, Emergency Drugs available, Patient being monitored and Timeout performed Oxygen Delivery Method: Simple face mask

## 2017-06-11 NOTE — Care Management Obs Status (Signed)
MEDICARE OBSERVATION STATUS NOTIFICATION   Patient Details  Name: Lori Bell MRN: [id number] Date of Birth: 07/24/1946   Medicare Observation Status Notification Given:  Yes    Malcolm MetroChildress, Quinta Eimer Demske, RN 06/11/2017, 11:36 AM

## 2017-06-11 NOTE — Progress Notes (Signed)
Subjective: She feels better. She is still having abdominal pain. No nausea vomiting or diarrhea. CT results noted. I contemplated starting her on antibiotics but since she has a colonoscopy scheduled for this morning I think we can wait until colonoscopy is done and we have results.  Objective: Vital signs in last 24 hours: Temp:  [98 F (36.7 C)-98.2 F (36.8 C)] 98.2 F (36.8 C) (10/23 0500) Pulse Rate:  [63-66] 63 (10/23 0500) Resp:  [16] 16 (10/23 0500) BP: (108-139)/(55-73) 108/55 (10/23 0500) SpO2:  [94 %-98 %] 94 % (10/23 0500) Weight:  [70.7 kg (155 lb 12.8 oz)-71.6 kg (157 lb 12.8 oz)] 71.6 kg (157 lb 12.8 oz) (10/23 0500) Weight change:  Last BM Date: 06/09/17  Intake/Output from previous day: 10/22 0701 - 10/23 0700 In: 240 [P.O.:240] Out: -   PHYSICAL EXAM General appearance: alert, cooperative and mild distress Resp: clear to auscultation bilaterally Cardio: regular rate and rhythm, S1, S2 normal, no murmur, click, rub or gallop GI: Mildly tender abdomen with active bowel sounds Extremities: extremities normal, atraumatic, no cyanosis or edema Skin warm and dry  Lab Results:  Results for orders placed or performed during the hospital encounter of 06/10/17 (from the past 48 hour(s))  Comprehensive metabolic panel     Status: Abnormal   Collection Time: 06/10/17  2:48 PM  Result Value Ref Range   Sodium 136 135 - 145 mmol/L    Comment: DELTA CHECK NOTED   Potassium 3.9 3.5 - 5.1 mmol/L    Comment: DELTA CHECK NOTED   Chloride 101 101 - 111 mmol/L   CO2 26 22 - 32 mmol/L   Glucose, Bld 104 (H) 65 - 99 mg/dL   BUN 10 6 - 20 mg/dL   Creatinine, Ser 0.81 0.44 - 1.00 mg/dL   Calcium 9.3 8.9 - 10.3 mg/dL   Total Protein 7.3 6.5 - 8.1 g/dL   Albumin 3.8 3.5 - 5.0 g/dL   AST 18 15 - 41 U/L   ALT 38 14 - 54 U/L   Alkaline Phosphatase 101 38 - 126 U/L   Total Bilirubin 1.0 0.3 - 1.2 mg/dL   GFR calc non Af Amer >60 >60 mL/min   GFR calc Af Amer >60 >60 mL/min     Comment: (NOTE) The eGFR has been calculated using the CKD EPI equation. This calculation has not been validated in all clinical situations. eGFR's persistently <60 mL/min signify possible Chronic Kidney Disease.    Anion gap 9 5 - 15  CBC WITH DIFFERENTIAL     Status: Abnormal   Collection Time: 06/10/17  2:48 PM  Result Value Ref Range   WBC 5.9 4.0 - 10.5 K/uL   RBC 3.94 3.87 - 5.11 MIL/uL   Hemoglobin 12.0 12.0 - 15.0 g/dL   HCT 34.8 (L) 36.0 - 46.0 %   MCV 88.3 78.0 - 100.0 fL   MCH 30.5 26.0 - 34.0 pg   MCHC 34.5 30.0 - 36.0 g/dL   RDW 12.8 11.5 - 15.5 %   Platelets 269 150 - 400 K/uL   Neutrophils Relative % 60 %   Neutro Abs 3.6 1.7 - 7.7 K/uL   Lymphocytes Relative 29 %   Lymphs Abs 1.7 0.7 - 4.0 K/uL   Monocytes Relative 9 %   Monocytes Absolute 0.5 0.1 - 1.0 K/uL   Eosinophils Relative 2 %   Eosinophils Absolute 0.1 0.0 - 0.7 K/uL   Basophils Relative 0 %   Basophils Absolute 0.0 0.0 - 0.1  K/uL  Protime-INR     Status: None   Collection Time: 06/10/17  2:48 PM  Result Value Ref Range   Prothrombin Time 12.4 11.4 - 15.2 seconds   INR 0.93   CBC     Status: Abnormal   Collection Time: 06/11/17  6:07 AM  Result Value Ref Range   WBC 4.9 4.0 - 10.5 K/uL   RBC 3.83 (L) 3.87 - 5.11 MIL/uL   Hemoglobin 11.5 (L) 12.0 - 15.0 g/dL   HCT 34.3 (L) 36.0 - 46.0 %   MCV 89.6 78.0 - 100.0 fL   MCH 30.0 26.0 - 34.0 pg   MCHC 33.5 30.0 - 36.0 g/dL   RDW 12.7 11.5 - 15.5 %   Platelets 271 150 - 400 K/uL  Basic metabolic panel     Status: Abnormal   Collection Time: 06/11/17  8:02 AM  Result Value Ref Range   Sodium 133 (L) 135 - 145 mmol/L   Potassium 3.0 (L) 3.5 - 5.1 mmol/L    Comment: DELTA CHECK NOTED   Chloride 99 (L) 101 - 111 mmol/L   CO2 27 22 - 32 mmol/L   Glucose, Bld 114 (H) 65 - 99 mg/dL   BUN 7 6 - 20 mg/dL   Creatinine, Ser 0.84 0.44 - 1.00 mg/dL   Calcium 8.7 (L) 8.9 - 10.3 mg/dL   GFR calc non Af Amer >60 >60 mL/min   GFR calc Af Amer >60 >60 mL/min     Comment: (NOTE) The eGFR has been calculated using the CKD EPI equation. This calculation has not been validated in all clinical situations. eGFR's persistently <60 mL/min signify possible Chronic Kidney Disease.    Anion gap 7 5 - 15    ABGS No results for input(s): PHART, PO2ART, TCO2, HCO3 in the last 72 hours.  Invalid input(s): PCO2 CULTURES No results found for this or any previous visit (from the past 240 hour(s)). Studies/Results: Ct Abdomen Pelvis W Contrast  Result Date: 06/10/2017 CLINICAL DATA:  Abdominal pain and weakness.  Rectal bleeding. EXAM: CT ABDOMEN AND PELVIS WITH CONTRAST TECHNIQUE: Multidetector CT imaging of the abdomen and pelvis was performed using the standard protocol following bolus administration of intravenous contrast. CONTRAST:  67m ISOVUE-300 IOPAMIDOL (ISOVUE-300) INJECTION 61%, 1016mISOVUE-300 IOPAMIDOL (ISOVUE-300) INJECTION 61% COMPARISON:  02/02/2004 FINDINGS: Lower chest: Atelectasis or scarring noted in the lingula. Coronary artery calcification is evident. Hepatobiliary: Small area of low attenuation in the anterior liver, adjacent to the falciform ligament, is in a characteristic location for focal fatty deposition. There is no evidence for gallstones, gallbladder wall thickening, or pericholecystic fluid. No intrahepatic or extrahepatic biliary dilation. Pancreas: No focal mass lesion. No dilatation of the main duct. No intraparenchymal cyst. No peripancreatic edema. Spleen: No splenomegaly. No focal mass lesion. Adrenals/Urinary Tract: 2.9 cm right adrenal lesion increased minimally from 2.2 cm on the study of 13 years ago and is most compatible with an adenoma. Left adrenal gland unremarkable. Kidneys unremarkable. No evidence for hydroureter. The urinary bladder appears normal for the degree of distention. Stomach/Bowel: Moderate hiatal hernia. Duodenum is normally positioned as is the ligament of Treitz. No small bowel wall thickening. No  small bowel dilatation. The terminal ileum is normal. The appendix is normal. Right and transverse segments of the colon are normal. There is a short segment of circumferential wall thickening in the splenic flexure (image 27 series 2 and image 82 sagittal series 5). Additionally, almost the entire descending segment of the colon shows circumferential  wall thickening and edema with subtle pericolonic edema/inflammation. Sigmoid colon and rectum are unremarkable. Vascular/Lymphatic: There is abdominal aortic atherosclerosis without aneurysm. Celiac axis and SMA are patent. Portal vein and superior mesenteric vein are patent. The inferior mesenteric artery opacifies. There is no gastrohepatic or hepatoduodenal ligament lymphadenopathy. No intraperitoneal or retroperitoneal lymphadenopathy. No pelvic sidewall lymphadenopathy. Reproductive: Uterus surgically absent.  There is no adnexal mass. Other: No intraperitoneal free fluid. Musculoskeletal: Bone windows reveal no worrisome lytic or sclerotic osseous lesions. IMPRESSION: 1. Relatively long segment of left colon, involving the entire descending segment, shows circumferential wall thickening and edema with pericolonic stranding. Imaging features likely related to infectious/inflammatory colitis. While ischemic colitis cannot be excluded, the IMA is opacified. 2. Short segment of circumferential wall thickening in the splenic flexure the colon. On coronal imaging this appears to the maintain haustral anatomy which would make circumferential neoplasia less likely, but correlation with colorectal cancer screening history recommended. 3. 2.9 cm right adrenal nodule increased slightly from 2.2 cm on the study of 13 years ago. Imaging features most compatible benign etiology such is adenoma. 4.  Aortic Atherosclerois (ICD10-170.0) Electronically Signed   By: Misty Stanley M.D.   On: 06/10/2017 19:21    Medications:  Prior to Admission:  Prescriptions Prior to  Admission  Medication Sig Dispense Refill Last Dose  . aspirin 81 MG tablet Take 81 mg by mouth at bedtime.    Taking  . Calcium Citrate-Vitamin D (CALCIUM CITRATE + PO) Take 500 mg by mouth 2 (two) times daily.    Taking  . desvenlafaxine (PRISTIQ) 50 MG 24 hr tablet Take 50 mg by mouth at bedtime.    Taking  . irbesartan-hydrochlorothiazide (AVALIDE) 150-12.5 MG tablet Take 1 tablet by mouth daily.   Taking  . levothyroxine (SYNTHROID, LEVOTHROID) 25 MCG tablet Take 25 mcg by mouth daily before breakfast.   Taking  . Memantine HCl ER (NAMENDA XR) 21 MG CP24 Take 1 tablet by mouth at bedtime.   Taking  . Omega-3 Fatty Acids (FISH OIL) 1000 MG CAPS Take 1,000 mg by mouth 2 (two) times daily.   Taking  . pravastatin (PRAVACHOL) 40 MG tablet Take 40 mg by mouth daily.   Taking  . propranolol ER (INDERAL LA) 80 MG 24 hr capsule Take 1 capsule (80 mg total) by mouth at bedtime. 90 capsule 4    Scheduled: . levothyroxine  25 mcg Oral QAC breakfast  . memantine  21 mg Oral QHS  . pantoprazole (PROTONIX) IV  40 mg Intravenous Q12H  . pravastatin  40 mg Oral Daily  . propranolol ER  80 mg Oral QHS  . venlafaxine XR  150 mg Oral Q breakfast   Continuous: . sodium chloride 50 mL/hr at 06/10/17 1543  . sodium chloride    . potassium chloride     XIP:[id number] **OR** acetaminophen, fentaNYL (SUBLIMAZE) injection, ondansetron **OR** ondansetron (ZOFRAN) IV  Assesment: She was admitted with abdominal pain and GI bleeding. She has multiple other medical problems. She had CT that is consistent with colitis. Clinically her situation is most like ischemic colitis. She is going to have colonoscopy this morning so I'm not going to start antibiotics at this point. Active Problems:   GI bleeding    Plan: As above    LOS: 0 days   Liese Dizdarevic L 06/11/2017, 8:52 AM

## 2017-06-11 NOTE — Op Note (Signed)
[facility] Patient Name: Lori Bell Procedure Date: 06/11/2017 10:05 AM MRN: [id number] Date of Birth: 27-Sep-1945 Attending MD: Jonette Eva MD, MD CSN: [id number] Age: 71 Admit Type: Inpatient Procedure:                Colonoscopy WITH COLD FORCEPS BIOPSY Indications:              Abdominal pain in the left lower quadrant, Diarrhea                            (presumed secondary to ischemic colitis),                            Hematochezia ON ASA DAILY. SYMPTOMS PRECEDED BY                            DECREASED PO INTAKE/MALAISE FOR 3 WEEKS/ACURE RENAL                            FAILUR(Cr UP TO 1.05, BASELINE 0.83) Providers:                Jonette Eva MD, MD, Loma Messing B. Patsy Lager, RN,                            Burke Keels, Technician Referring MD:             Oneal Deputy. Juanetta Gosling MD, MD Medicines:                Propofol per Anesthesia Complications:            No immediate complications. Estimated Blood Loss:     Estimated blood loss was minimal. Procedure:                Pre-Anesthesia Assessment:                           - Prior to the procedure, a History and Physical                            was performed, and patient medications and                            allergies were reviewed. The patient's tolerance of                            previous anesthesia was also reviewed. The risks                            and benefits of the procedure and the sedation                            options and risks were discussed with the patient.                            All questions were answered, and informed consent  was obtained. Prior Anticoagulants: The patient has                            taken aspirin, last dose was 2 days prior to                            procedure. ASA Grade Assessment: II - A patient                            with mild systemic disease. After reviewing the                            risks and benefits, the patient was  deemed in                            satisfactory condition to undergo the procedure.                            After obtaining informed consent, the colonoscope                            was passed under direct vision. Throughout the                            procedure, the patient's blood pressure, pulse, and                            oxygen saturations were monitored continuously. The                            EC-[id number] ([id number]) scope was introduced through                            the anus and advanced to the 5 cm into the ileum.                            The colonoscopy was somewhat difficult due to a                            tortuous colon. Successful completion of the                            procedure was aided by COLOWRAP. The patient                            tolerated the procedure well. The quality of the                            bowel preparation was good. The terminal ileum,                            ileocecal valve, appendiceal orifice, and rectum  were photographed. Scope In: 10:36:11 AM Scope Out: 10:52:23 AM Scope Withdrawal Time: 0 hours 12 minutes 39 seconds  Total Procedure Duration: 0 hours 16 minutes 12 seconds  Findings:      The terminal ileum appeared normal.      Discontinuous areas of nonbleeding ulcerated mucosa with no stigmata of       recent bleeding were present in the sigmoid colon(35 CM FROM, THE ANAL       VERGE), in the descending colon and at the splenic flexure(~45-50 CM       FROM THE ANAL VERGE). This was biopsied with a cold forceps for       histology.      Many small and large-mouthed diverticula were found in the recto-sigmoid       colon and sigmoid colon.      External hemorrhoids were found during retroflexion. The hemorrhoids       were moderate.      The recto-sigmoid colon and sigmoid colon were moderately redundant.      Internal hemorrhoids were found during retroflexion. The hemorrhoids        were small. Impression:               - The examined portion of the ileum was normal.                           - RECTAL BLEEDING/ABDOMINAL PAIN/DIARRHEA MOST                            LIKELY DUE TO ISCHEMIC COLITIS                           - MILD Diverticulosis in the recto-sigmoid colon                            and in the sigmoid colon.                           - External AND INTERNAL hemorrhoids.                           - Redundant LEFT colon. Moderate Sedation:      Per Anesthesia Care Recommendation:           - High fiber diet and low fat diet.                           - Continue present medications.                           - Await pathology results.                           - Repeat colonoscopy 10-15 YEARS IF THE BENEFITS                            OUTWEIGH THE RISKS for surveillance.                           - Patient has a contact number available for  emergencies. The signs and symptoms of potential                            delayed complications were discussed with the                            patient. Return to normal activities tomorrow.                            Written discharge instructions were provided to the                            patient. Procedure Code(s):        --- Professional ---                           [id number], Colonoscopy, flexible; with biopsy, single                            or multiple Diagnosis Code(s):        --- Professional ---                           K64.4, Residual hemorrhoidal skin tags                           K63.3, Ulcer of intestine                           R10.32, Left lower quadrant pain                           R19.7, Diarrhea, unspecified                           K92.1, Melena (includes Hematochezia)                           K57.30, Diverticulosis of large intestine without                            perforation or abscess without bleeding                           Q43.8, Other  specified congenital malformations of                            intestine CPT copyright 2016 [employer]. All rights reserved. The codes documented in this report are preliminary and upon coder review may  be revised to meet current compliance requirements. Jonette EvaSandi Rodneisha Bonnet, MD Jonette EvaSandi Giabella Duhart MD, MD 06/11/2017 11:06:26 AM This report has been signed electronically. Number of Addenda: 0

## 2017-06-12 DIAGNOSIS — K633 Ulcer of intestine: Secondary | ICD-10-CM | POA: Diagnosis not present

## 2017-06-12 DIAGNOSIS — K529 Noninfective gastroenteritis and colitis, unspecified: Secondary | ICD-10-CM | POA: Diagnosis not present

## 2017-06-12 LAB — BASIC METABOLIC PANEL
Anion gap: 5 (ref 5–15)
BUN: <5 mg/dL — ABNORMAL LOW (ref 6–20)
CO2: 24 mmol/L (ref 22–32)
Calcium: 8 mg/dL — ABNORMAL LOW (ref 8.9–10.3)
Chloride: 104 mmol/L (ref 101–111)
Creatinine, Ser: 0.75 mg/dL (ref 0.44–1.00)
GFR calc Af Amer: >60 mL/min (ref >60)
GFR calc non Af Amer: >60 mL/min (ref >60)
Glucose, Bld: 88 mg/dL (ref 65–99)
Potassium: 2.9 mmol/L — ABNORMAL LOW (ref 3.5–5.1)
Sodium: 133 mmol/L — ABNORMAL LOW (ref 135–145)

## 2017-06-12 LAB — CBC WITH DIFFERENTIAL/PLATELET
Basophils Absolute: 0 10*3/uL (ref 0.0–0.1)
Basophils Relative: 0 %
Eosinophils Absolute: 0.1 10*3/uL (ref 0.0–0.7)
Eosinophils Relative: 4 %
HCT: 29.7 % — ABNORMAL LOW (ref 36.0–46.0)
Hemoglobin: 9.9 g/dL — ABNORMAL LOW (ref 12.0–15.0)
Lymphocytes Relative: 35 %
Lymphs Abs: 1.2 10*3/uL (ref 0.7–4.0)
MCH: 30.2 pg (ref 26.0–34.0)
MCHC: 33.3 g/dL (ref 30.0–36.0)
MCV: 90.5 fL (ref 78.0–100.0)
Monocytes Absolute: 0.2 10*3/uL (ref 0.1–1.0)
Monocytes Relative: 6 %
Neutro Abs: 1.9 10*3/uL (ref 1.7–7.7)
Neutrophils Relative %: 55 %
Platelets: 205 10*3/uL (ref 150–400)
RBC: 3.28 MIL/uL — ABNORMAL LOW (ref 3.87–5.11)
RDW: 13.3 % (ref 11.5–15.5)
WBC: 3.4 10*3/uL — ABNORMAL LOW (ref 4.0–10.5)

## 2017-06-12 MED ORDER — POTASSIUM CHLORIDE CRYS ER 20 MEQ PO TBCR
40.0000 meq | EXTENDED_RELEASE_TABLET | Freq: Once | ORAL | Status: AC
  Administered 2017-06-12: 40 meq via ORAL
  Filled 2017-06-12: qty 2

## 2017-06-12 MED ORDER — CIPROFLOXACIN HCL 500 MG PO TABS: 500.0000 mg | ORAL_TABLET | Freq: Two times a day (BID) | ORAL | 0 refills | Status: AC

## 2017-06-12 MED ORDER — DESVENLAFAXINE SUCCINATE ER 50 MG PO TB24: 50.0000 mg | ORAL_TABLET | Freq: Every day | ORAL | 3 refills | Status: DC

## 2017-06-12 NOTE — Progress Notes (Signed)
Reviewed d/c paperwork with patient, daughter, and granddaughter. Discussed new medication-cipro. Sent pts belongs with her daughter. I wheeled her to the main entrance where she got in her daughter's car.

## 2017-06-12 NOTE — Progress Notes (Signed)
Subjective: She feels much better. Her abdominal pain has essentially resolved. No bleeding. Colonoscopy consistent with ischemic colitis. No evidence of infectious colitis.  Objective: Vital signs in last 24 hours: Temp:  [97.8 F (36.6 C)-98.5 F (36.9 C)] 98.2 F (36.8 C) (10/24 0509) Pulse Rate:  [58-78] 76 (10/24 0509) Resp:  [16-23] 21 (10/24 0509) BP: (106-151)/(54-84) 111/56 (10/24 0509) SpO2:  [94 %-100 %] 94 % (10/24 0509) Weight change:  Last BM Date: 06/11/17  Intake/Output from previous day: 10/23 0701 - 10/24 0700 In: 3303.3 [P.O.:600; I.V.:2303.3; IV Piggyback:400] Out: 0   PHYSICAL EXAM General appearance: alert, cooperative and no distress Resp: clear to auscultation bilaterally Cardio: regular rate and rhythm, S1, S2 normal, no murmur, click, rub or gallop GI: soft, non-tender; bowel sounds normal; no masses,  no organomegaly Extremities: extremities normal, atraumatic, no cyanosis or edema She has several skin lesions that she has been scratching and looked to be mildly infected  Lab Results:  Results for orders placed or performed during the hospital encounter of 06/10/17 (from the past 48 hour(s))  Comprehensive metabolic panel     Status: Abnormal   Collection Time: 06/10/17  2:48 PM  Result Value Ref Range   Sodium 136 135 - 145 mmol/L    Comment: DELTA CHECK NOTED   Potassium 3.9 3.5 - 5.1 mmol/L    Comment: DELTA CHECK NOTED   Chloride 101 101 - 111 mmol/L   CO2 26 22 - 32 mmol/L   Glucose, Bld 104 (H) 65 - 99 mg/dL   BUN 10 6 - 20 mg/dL   Creatinine, Ser 0.81 0.44 - 1.00 mg/dL   Calcium 9.3 8.9 - 10.3 mg/dL   Total Protein 7.3 6.5 - 8.1 g/dL   Albumin 3.8 3.5 - 5.0 g/dL   AST 18 15 - 41 U/L   ALT 38 14 - 54 U/L   Alkaline Phosphatase 101 38 - 126 U/L   Total Bilirubin 1.0 0.3 - 1.2 mg/dL   GFR calc non Af Amer >60 >60 mL/min   GFR calc Af Amer >60 >60 mL/min    Comment: (NOTE) The eGFR has been calculated using the CKD EPI  equation. This calculation has not been validated in all clinical situations. eGFR's persistently <60 mL/min signify possible Chronic Kidney Disease.    Anion gap 9 5 - 15  CBC WITH DIFFERENTIAL     Status: Abnormal   Collection Time: 06/10/17  2:48 PM  Result Value Ref Range   WBC 5.9 4.0 - 10.5 K/uL   RBC 3.94 3.87 - 5.11 MIL/uL   Hemoglobin 12.0 12.0 - 15.0 g/dL   HCT 34.8 (L) 36.0 - 46.0 %   MCV 88.3 78.0 - 100.0 fL   MCH 30.5 26.0 - 34.0 pg   MCHC 34.5 30.0 - 36.0 g/dL   RDW 12.8 11.5 - 15.5 %   Platelets 269 150 - 400 K/uL   Neutrophils Relative % 60 %   Neutro Abs 3.6 1.7 - 7.7 K/uL   Lymphocytes Relative 29 %   Lymphs Abs 1.7 0.7 - 4.0 K/uL   Monocytes Relative 9 %   Monocytes Absolute 0.5 0.1 - 1.0 K/uL   Eosinophils Relative 2 %   Eosinophils Absolute 0.1 0.0 - 0.7 K/uL   Basophils Relative 0 %   Basophils Absolute 0.0 0.0 - 0.1 K/uL  Protime-INR     Status: None   Collection Time: 06/10/17  2:48 PM  Result Value Ref Range   Prothrombin Time 12.4  11.4 - 15.2 seconds   INR 0.93   CBC     Status: Abnormal   Collection Time: 06/11/17  6:07 AM  Result Value Ref Range   WBC 4.9 4.0 - 10.5 K/uL   RBC 3.83 (L) 3.87 - 5.11 MIL/uL   Hemoglobin 11.5 (L) 12.0 - 15.0 g/dL   HCT 34.3 (L) 36.0 - 46.0 %   MCV 89.6 78.0 - 100.0 fL   MCH 30.0 26.0 - 34.0 pg   MCHC 33.5 30.0 - 36.0 g/dL   RDW 12.7 11.5 - 15.5 %   Platelets 271 150 - 400 K/uL  Basic metabolic panel     Status: Abnormal   Collection Time: 06/11/17  8:02 AM  Result Value Ref Range   Sodium 133 (L) 135 - 145 mmol/L   Potassium 3.0 (L) 3.5 - 5.1 mmol/L    Comment: DELTA CHECK NOTED   Chloride 99 (L) 101 - 111 mmol/L   CO2 27 22 - 32 mmol/L   Glucose, Bld 114 (H) 65 - 99 mg/dL   BUN 7 6 - 20 mg/dL   Creatinine, Ser 0.84 0.44 - 1.00 mg/dL   Calcium 8.7 (L) 8.9 - 10.3 mg/dL   GFR calc non Af Amer >60 >60 mL/min   GFR calc Af Amer >60 >60 mL/min    Comment: (NOTE) The eGFR has been calculated using the CKD  EPI equation. This calculation has not been validated in all clinical situations. eGFR's persistently <60 mL/min signify possible Chronic Kidney Disease.    Anion gap 7 5 - 15    ABGS No results for input(s): PHART, PO2ART, TCO2, HCO3 in the last 72 hours.  Invalid input(s): PCO2 CULTURES No results found for this or any previous visit (from the past 240 hour(s)). Studies/Results: Ct Abdomen Pelvis W Contrast  Result Date: 06/10/2017 CLINICAL DATA:  Abdominal pain and weakness.  Rectal bleeding. EXAM: CT ABDOMEN AND PELVIS WITH CONTRAST TECHNIQUE: Multidetector CT imaging of the abdomen and pelvis was performed using the standard protocol following bolus administration of intravenous contrast. CONTRAST:  59m ISOVUE-300 IOPAMIDOL (ISOVUE-300) INJECTION 61%, 1051mISOVUE-300 IOPAMIDOL (ISOVUE-300) INJECTION 61% COMPARISON:  02/02/2004 FINDINGS: Lower chest: Atelectasis or scarring noted in the lingula. Coronary artery calcification is evident. Hepatobiliary: Small area of low attenuation in the anterior liver, adjacent to the falciform ligament, is in a characteristic location for focal fatty deposition. There is no evidence for gallstones, gallbladder wall thickening, or pericholecystic fluid. No intrahepatic or extrahepatic biliary dilation. Pancreas: No focal mass lesion. No dilatation of the main duct. No intraparenchymal cyst. No peripancreatic edema. Spleen: No splenomegaly. No focal mass lesion. Adrenals/Urinary Tract: 2.9 cm right adrenal lesion increased minimally from 2.2 cm on the study of 13 years ago and is most compatible with an adenoma. Left adrenal gland unremarkable. Kidneys unremarkable. No evidence for hydroureter. The urinary bladder appears normal for the degree of distention. Stomach/Bowel: Moderate hiatal hernia. Duodenum is normally positioned as is the ligament of Treitz. No small bowel wall thickening. No small bowel dilatation. The terminal ileum is normal. The appendix  is normal. Right and transverse segments of the colon are normal. There is a short segment of circumferential wall thickening in the splenic flexure (image 27 series 2 and image 82 sagittal series 5). Additionally, almost the entire descending segment of the colon shows circumferential wall thickening and edema with subtle pericolonic edema/inflammation. Sigmoid colon and rectum are unremarkable. Vascular/Lymphatic: There is abdominal aortic atherosclerosis without aneurysm. Celiac axis and SMA are  patent. Portal vein and superior mesenteric vein are patent. The inferior mesenteric artery opacifies. There is no gastrohepatic or hepatoduodenal ligament lymphadenopathy. No intraperitoneal or retroperitoneal lymphadenopathy. No pelvic sidewall lymphadenopathy. Reproductive: Uterus surgically absent.  There is no adnexal mass. Other: No intraperitoneal free fluid. Musculoskeletal: Bone windows reveal no worrisome lytic or sclerotic osseous lesions. IMPRESSION: 1. Relatively long segment of left colon, involving the entire descending segment, shows circumferential wall thickening and edema with pericolonic stranding. Imaging features likely related to infectious/inflammatory colitis. While ischemic colitis cannot be excluded, the IMA is opacified. 2. Short segment of circumferential wall thickening in the splenic flexure the colon. On coronal imaging this appears to the maintain haustral anatomy which would make circumferential neoplasia less likely, but correlation with colorectal cancer screening history recommended. 3. 2.9 cm right adrenal nodule increased slightly from 2.2 cm on the study of 13 years ago. Imaging features most compatible benign etiology such is adenoma. 4.  Aortic Atherosclerois (ICD10-170.0) Electronically Signed   By: Misty Stanley M.D.   On: 06/10/2017 19:21    Medications:  Prior to Admission:  Prescriptions Prior to Admission  Medication Sig Dispense Refill Last Dose  . alendronate  (FOSAMAX) 70 MG tablet Take 70 mg by mouth once a week. Take with a full glass of water on an empty stomach.   Past Week at Unknown time  . aspirin 81 MG tablet Take 81 mg by mouth at bedtime.    Past Week at Unknown time  . Calcium Citrate-Vitamin D (CALCIUM CITRATE + PO) Take 500 mg by mouth 2 (two) times daily.    Past Week at Unknown time  . DULoxetine (CYMBALTA) 60 MG capsule Take 60 mg by mouth daily.   Past Week at Unknown time  . irbesartan-hydrochlorothiazide (AVALIDE) 150-12.5 MG tablet Take 1 tablet by mouth daily.   Past Week at Unknown time  . levothyroxine (SYNTHROID, LEVOTHROID) 25 MCG tablet Take 25 mcg by mouth daily before breakfast.   Past Week at Unknown time  . Memantine HCl ER (NAMENDA XR) 21 MG CP24 Take 1 tablet by mouth at bedtime.   Past Week at Unknown time  . Omega-3 Fatty Acids (FISH OIL) 1000 MG CAPS Take 1,000 mg by mouth 2 (two) times daily.   Past Week at Unknown time  . pantoprazole (PROTONIX) 40 MG tablet Take 40 mg by mouth daily.   Past Week at Unknown time  . polyvinyl alcohol (LIQUIFILM TEARS) 1.4 % ophthalmic solution Place 1 drop into both eyes as needed for dry eyes.   Past Week at Unknown time  . pravastatin (PRAVACHOL) 40 MG tablet Take 40 mg by mouth daily.   Past Week at Unknown time  . propranolol ER (INDERAL LA) 80 MG 24 hr capsule Take 1 capsule (80 mg total) by mouth at bedtime. 90 capsule 4 Past Week at Unknown time   Scheduled: . aspirin EC  81 mg Oral Daily  . levothyroxine  25 mcg Oral QAC breakfast  . memantine  21 mg Oral QHS  . pantoprazole  40 mg Oral QAC breakfast  . pravastatin  40 mg Oral Daily  . propranolol ER  80 mg Oral QHS  . venlafaxine XR  150 mg Oral Q breakfast   Continuous: . sodium chloride 50 mL/hr at 06/11/17 2217   KGU:[id number] **OR** acetaminophen, fentaNYL (SUBLIMAZE) injection, ondansetron **OR** ondansetron (ZOFRAN) IV  Assesment: She was admitted with GI bleeding. She has what looks like acute colitis. She  is tolerating a diet  and I think she can probably go home. Her other medical problems are stable. She does have some skin lesions that I think will require treatment. Active Problems:   Cardiovascular disease   GASTROESOPHAGEAL REFLUX DISEASE   Essential tremor   GI bleeding   Dementia due to Alzheimer's disease   Depression   Acute ischemic colitis ([place])    Plan: Discharge home today    LOS: 0 days   Richa Shor L 06/12/2017, 8:48 AM

## 2017-06-12 NOTE — Discharge Summary (Signed)
Physician Discharge Summary  Patient ID: Lori Bell MRN: [id number] DOB/AGE: 1946-07-01 71 y.o. Primary Care Physician:Andalyn Heckstall, Ramon Dredge, MD Admit date: 06/10/2017 Discharge date: 06/12/2017    Discharge Diagnoses:   Active Problems:   Cardiovascular disease   GASTROESOPHAGEAL REFLUX DISEASE   Essential tremor   GI bleeding   Dementia due to Alzheimer's disease   Depression   Acute ischemic colitis (HCC)   Allergies as of 06/12/2017   No Known Allergies     Medication List    TAKE these medications   alendronate 70 MG tablet Commonly known as:  FOSAMAX Take 70 mg by mouth once a week. Take with a full glass of water on an empty stomach.   aspirin 81 MG tablet Take 81 mg by mouth at bedtime.   CALCIUM CITRATE + PO Take 500 mg by mouth 2 (two) times daily.   ciprofloxacin 500 MG tablet Commonly known as:  CIPRO Take 1 tablet (500 mg total) by mouth 2 (two) times daily.   desvenlafaxine 50 MG 24 hr tablet Commonly known as:  PRISTIQ Take 1 tablet (50 mg total) by mouth at bedtime.   DULoxetine 60 MG capsule Commonly known as:  CYMBALTA Take 60 mg by mouth daily.   Fish Oil 1000 MG Caps Take 1,000 mg by mouth 2 (two) times daily.   irbesartan-hydrochlorothiazide 150-12.5 MG tablet Commonly known as:  AVALIDE Take 1 tablet by mouth daily.   levothyroxine 25 MCG tablet Commonly known as:  SYNTHROID, LEVOTHROID Take 25 mcg by mouth daily before breakfast.   NAMENDA XR 21 MG Cp24 Generic drug:  Memantine HCl ER Take 1 tablet by mouth at bedtime.   pantoprazole 40 MG tablet Commonly known as:  PROTONIX Take 40 mg by mouth daily.   polyvinyl alcohol 1.4 % ophthalmic solution Commonly known as:  LIQUIFILM TEARS Place 1 drop into both eyes as needed for dry eyes.   pravastatin 40 MG tablet Commonly known as:  PRAVACHOL Take 40 mg by mouth daily.   propranolol ER 80 MG 24 hr capsule Commonly known as:  INDERAL LA Take 1 capsule (80 mg total) by mouth  at bedtime.       Discharged Condition:Improved    Consults: Gastroenterology  Significant Diagnostic Studies: Ct Abdomen Pelvis W Contrast  Result Date: 06/10/2017 CLINICAL DATA:  Abdominal pain and weakness.  Rectal bleeding. EXAM: CT ABDOMEN AND PELVIS WITH CONTRAST TECHNIQUE: Multidetector CT imaging of the abdomen and pelvis was performed using the standard protocol following bolus administration of intravenous contrast. CONTRAST:  30mL ISOVUE-300 IOPAMIDOL (ISOVUE-300) INJECTION 61%, ISOVUE-300 IOPAMIDOL (ISOVUE-300) INJECTION 61% COMPARISON:  02/02/2004 FINDINGS: Lower chest: Atelectasis or scarring noted in the lingula. Coronary artery calcification is evident. Hepatobiliary: Small area of low attenuation in the anterior liver, adjacent to the falciform ligament, is in a characteristic location for focal fatty deposition. There is no evidence for gallstones, gallbladder wall thickening, or pericholecystic fluid. No intrahepatic or extrahepatic biliary dilation. Pancreas: No focal mass lesion. No dilatation of the main duct. No intraparenchymal cyst. No peripancreatic edema. Spleen: No splenomegaly. No focal mass lesion. Adrenals/Urinary Tract: 2.9 cm right adrenal lesion increased minimally from 2.2 cm on the study of 13 years ago and is most compatible with an adenoma. Left adrenal gland unremarkable. Kidneys unremarkable. No evidence for hydroureter. The urinary bladder appears normal for the degree of distention. Stomach/Bowel: Moderate hiatal hernia. Duodenum is normally positioned as is the ligament of Treitz. No small bowel wall thickening. No small bowel  dilatation. The terminal ileum is normal. The appendix is normal. Right and transverse segments of the colon are normal. There is a short segment of circumferential wall thickening in the splenic flexure (image 27 series 2 and image 82 sagittal series 5). Additionally, almost the entire descending segment of the colon shows  circumferential wall thickening and edema with subtle pericolonic edema/inflammation. Sigmoid colon and rectum are unremarkable. Vascular/Lymphatic: There is abdominal aortic atherosclerosis without aneurysm. Celiac axis and SMA are patent. Portal vein and superior mesenteric vein are patent. The inferior mesenteric artery opacifies. There is no gastrohepatic or hepatoduodenal ligament lymphadenopathy. No intraperitoneal or retroperitoneal lymphadenopathy. No pelvic sidewall lymphadenopathy. Reproductive: Uterus surgically absent.  There is no adnexal mass. Other: No intraperitoneal free fluid. Musculoskeletal: Bone windows reveal no worrisome lytic or sclerotic osseous lesions. IMPRESSION: 1. Relatively long segment of left colon, involving the entire descending segment, shows circumferential wall thickening and edema with pericolonic stranding. Imaging features likely related to infectious/inflammatory colitis. While ischemic colitis cannot be excluded, the IMA is opacified. 2. Short segment of circumferential wall thickening in the splenic flexure the colon. On coronal imaging this appears to the maintain haustral anatomy which would make circumferential neoplasia less likely, but correlation with colorectal cancer screening history recommended. 3. 2.9 cm right adrenal nodule increased slightly from 2.2 cm on the study of 13 years ago. Imaging features most compatible benign etiology such is adenoma. 4.  Aortic Atherosclerois (ICD10-170.0) Electronically Signed   By: Kennith CenterEric  Mansell M.D.   On: 06/10/2017 19:21    Lab Results: Basic Metabolic Panel:  Recent Labs  16/05/9609/22/18 1448 06/11/17 0802  NA 136 133*  K 3.9 3.0*  CL 101 99*  CO2 26 27  GLUCOSE 104* 114*  BUN 10 7  CREATININE 0.81 0.84  CALCIUM 9.3 8.7*   Liver Function Tests:  Recent Labs  06/09/17 1920 06/10/17 1448  AST 26 18  ALT 45 38  ALKPHOS 116 101  BILITOT 0.9 1.0  PROT 8.0 7.3  ALBUMIN 4.2 3.8     CBC:  Recent Labs   06/09/17 1920 06/10/17 1448 06/11/17 0607  WBC 8.4 5.9 4.9  NEUTROABS 6.3 3.6  --   HGB 13.5 12.0 11.5*  HCT 39.0 34.8* 34.3*  MCV 87.6 88.3 89.6  PLT 301 269 271    No results found for this or any previous visit (from the past 240 hour(s)).   Hospital Course: This is a 71 year old came to my office on the day of admission with weakness and abdominal pain and GI bleeding. She was brought in for observation. She had CT of the abdomen and pelvis which showed that she had colitis. GI consultation was obtained and she underwent colonoscopy the next day there was consistent with ischemic colitis. She improved rapidly was able to eat and soft diet and was able to be discharged home the next day.  Discharge Exam: Blood pressure (!) 111/56, pulse 76, temperature 98.2 F (36.8 C), temperature source Oral, resp. rate (!) 21, height 5\' 5"  (1.651 m), weight 71.6 kg (157 lb 12.8 oz), SpO2 94 %. She is awake and alert. Abdomen is soft. She looks comfortable.  Disposition: Home  Discharge Instructions    Call MD for:  persistant nausea and vomiting    Complete by:  As directed    Call MD for:  temperature >100.4    Complete by:  As directed    Diet - low sodium heart healthy    Complete by:  As directed  Increase activity slowly    Complete by:  As directed         Signed: Lindora Alviar Bell   06/12/2017, 9:01 AM

## 2017-06-19 ENCOUNTER — Encounter (HOSPITAL_COMMUNITY): Payer: Self-pay | Admitting: Gastroenterology

## 2017-06-26 ENCOUNTER — Encounter: Payer: Self-pay | Admitting: Pulmonary Disease

## 2017-06-26 DIAGNOSIS — K55039 Acute (reversible) ischemia of large intestine, extent unspecified: Secondary | ICD-10-CM | POA: Diagnosis not present

## 2017-06-26 DIAGNOSIS — K2920 Alcoholic gastritis without bleeding: Secondary | ICD-10-CM | POA: Diagnosis not present

## 2017-06-26 DIAGNOSIS — I10 Essential (primary) hypertension: Secondary | ICD-10-CM | POA: Diagnosis not present

## 2017-06-26 DIAGNOSIS — K55031 Focal (segmental) acute (reversible) ischemia of large intestine: Secondary | ICD-10-CM | POA: Diagnosis not present

## 2017-06-26 DIAGNOSIS — F0390 Unspecified dementia without behavioral disturbance: Secondary | ICD-10-CM | POA: Diagnosis not present

## 2019-02-02 ENCOUNTER — Other Ambulatory Visit (HOSPITAL_COMMUNITY): Payer: Self-pay | Admitting: Pulmonary Disease

## 2019-02-02 ENCOUNTER — Ambulatory Visit (HOSPITAL_COMMUNITY)
Admission: RE | Admit: 2019-02-02 | Discharge: 2019-02-02 | Disposition: A | Discharge status: Home / Self Care | Payer: PPO | Source: Ambulatory Visit | Attending: Pulmonary Disease | Admitting: Pulmonary Disease

## 2019-02-02 ENCOUNTER — Other Ambulatory Visit: Payer: Self-pay

## 2019-02-02 DIAGNOSIS — W19XXXD Unspecified fall, subsequent encounter: Secondary | ICD-10-CM | POA: Diagnosis not present

## 2019-02-02 DIAGNOSIS — R0602 Shortness of breath: Secondary | ICD-10-CM | POA: Insufficient documentation

## 2019-02-02 DIAGNOSIS — R0789 Other chest pain: Secondary | ICD-10-CM

## 2019-02-02 DIAGNOSIS — E785 Hyperlipidemia, unspecified: Secondary | ICD-10-CM | POA: Diagnosis not present

## 2019-02-02 DIAGNOSIS — I10 Essential (primary) hypertension: Secondary | ICD-10-CM | POA: Diagnosis not present

## 2019-02-02 DIAGNOSIS — R079 Chest pain, unspecified: Secondary | ICD-10-CM | POA: Diagnosis not present

## 2019-02-02 DIAGNOSIS — E039 Hypothyroidism, unspecified: Secondary | ICD-10-CM | POA: Diagnosis not present

## 2019-02-02 DIAGNOSIS — F0390 Unspecified dementia without behavioral disturbance: Secondary | ICD-10-CM | POA: Diagnosis not present

## 2019-02-03 ENCOUNTER — Encounter: Payer: Self-pay | Admitting: Pulmonary Disease

## 2019-02-03 DIAGNOSIS — F0390 Unspecified dementia without behavioral disturbance: Secondary | ICD-10-CM | POA: Diagnosis not present

## 2019-02-03 DIAGNOSIS — E039 Hypothyroidism, unspecified: Secondary | ICD-10-CM | POA: Diagnosis not present

## 2019-02-03 DIAGNOSIS — I10 Essential (primary) hypertension: Secondary | ICD-10-CM | POA: Diagnosis not present

## 2019-02-03 DIAGNOSIS — E785 Hyperlipidemia, unspecified: Secondary | ICD-10-CM | POA: Diagnosis not present

## 2019-02-03 LAB — HEPATIC FUNCTION PANEL
ALT: 13 (ref 7–35)
AST: 10 — AB (ref 13–35)
Alkaline Phosphatase: 100 (ref 25–125)

## 2019-02-03 LAB — CBC AND DIFFERENTIAL
HCT: 39 (ref 36–46)
Hemoglobin: 13.3 (ref 12.0–16.0)
Platelets: 269 (ref 150–399)
WBC: 4.8

## 2019-02-03 LAB — LIPID PANEL
Cholesterol: 174 (ref 0–200)
HDL: 59 (ref 35–70)
LDL Cholesterol: 95
Triglycerides: 102 (ref 40–160)

## 2019-02-03 LAB — TSH: TSH: 3.28 (ref <=5.90)

## 2019-02-03 LAB — COMPREHENSIVE METABOLIC PANEL
Albumin: 4.4 (ref 3.5–5.0)
Globulin: 3.1

## 2019-02-03 LAB — CBC: RBC: 4.48 (ref 3.87–5.11)

## 2019-02-18 DIAGNOSIS — E876 Hypokalemia: Secondary | ICD-10-CM | POA: Diagnosis not present

## 2019-02-18 LAB — BASIC METABOLIC PANEL
BUN: 14 (ref 4–21)
CO2: 30 — AB (ref 13–22)
Chloride: 99 (ref 99–108)
Creatinine: 1 (ref 0.5–1.1)
Glucose: 139
Potassium: 4.6 (ref 3.4–5.3)
Sodium: 137 (ref 137–147)

## 2019-02-18 LAB — COMPREHENSIVE METABOLIC PANEL
Calcium: 9.7 (ref 8.7–10.7)
GFR calc Af Amer: 61
GFR calc non Af Amer: 53

## 2019-03-12 DIAGNOSIS — F419 Anxiety disorder, unspecified: Secondary | ICD-10-CM | POA: Diagnosis not present

## 2019-03-12 DIAGNOSIS — E039 Hypothyroidism, unspecified: Secondary | ICD-10-CM | POA: Diagnosis not present

## 2019-03-12 DIAGNOSIS — F321 Major depressive disorder, single episode, moderate: Secondary | ICD-10-CM | POA: Diagnosis not present

## 2019-03-12 DIAGNOSIS — I10 Essential (primary) hypertension: Secondary | ICD-10-CM | POA: Diagnosis not present

## 2019-06-11 DIAGNOSIS — E785 Hyperlipidemia, unspecified: Secondary | ICD-10-CM | POA: Diagnosis not present

## 2019-06-11 DIAGNOSIS — I10 Essential (primary) hypertension: Secondary | ICD-10-CM | POA: Diagnosis not present

## 2019-06-11 DIAGNOSIS — Z23 Encounter for immunization: Secondary | ICD-10-CM | POA: Diagnosis not present

## 2019-06-11 DIAGNOSIS — E039 Hypothyroidism, unspecified: Secondary | ICD-10-CM | POA: Diagnosis not present

## 2019-06-11 DIAGNOSIS — F0390 Unspecified dementia without behavioral disturbance: Secondary | ICD-10-CM | POA: Diagnosis not present

## 2019-08-07 DIAGNOSIS — R1010 Upper abdominal pain, unspecified: Secondary | ICD-10-CM

## 2019-08-07 DIAGNOSIS — E782 Mixed hyperlipidemia: Secondary | ICD-10-CM | POA: Insufficient documentation

## 2019-08-07 DIAGNOSIS — F329 Major depressive disorder, single episode, unspecified: Secondary | ICD-10-CM

## 2019-08-07 DIAGNOSIS — K922 Gastrointestinal hemorrhage, unspecified: Secondary | ICD-10-CM

## 2019-08-07 DIAGNOSIS — F419 Anxiety disorder, unspecified: Secondary | ICD-10-CM

## 2019-08-07 DIAGNOSIS — K55039 Acute (reversible) ischemia of large intestine, extent unspecified: Secondary | ICD-10-CM

## 2019-08-07 DIAGNOSIS — E039 Hypothyroidism, unspecified: Secondary | ICD-10-CM | POA: Insufficient documentation

## 2019-08-07 DIAGNOSIS — F0390 Unspecified dementia without behavioral disturbance: Secondary | ICD-10-CM

## 2019-08-07 DIAGNOSIS — I10 Essential (primary) hypertension: Secondary | ICD-10-CM

## 2019-08-31 ENCOUNTER — Telehealth: Payer: Self-pay | Admitting: Family Medicine

## 2019-08-31 ENCOUNTER — Ambulatory Visit: Payer: PPO | Admitting: Family Medicine

## 2019-08-31 NOTE — Telephone Encounter (Signed)
Patient was originally scheduled at 4pm for today 08/31/19 and I spoke with the patient on 07/27/19 and we changed her appointment to 1pm. I also contacted the patient 08/27/19 and confirmed the appointment time at the number I have on file [phone].   Patient daughter [name] came into the office at 4pm for the patient to be seen and I informed her that the appointment was changed to 1pm that I would have to speak with Dr. Judee Clara to see if we could still see the patient. Dr. Judee Clara said we could see the patient at 4:30 and the daughter refused and said they would just leave and she also wanted to cancel her appointment that she had scheduled for herself on 09/02/19. Appointment was canceled as patient requested and per Dr. Judee Clara they can not be rescheduled at this office, [facility].

## 2019-10-16 ENCOUNTER — Ambulatory Visit: Payer: PPO | Admitting: Internal Medicine

## 2019-10-28 ENCOUNTER — Other Ambulatory Visit: Payer: Self-pay

## 2019-10-28 ENCOUNTER — Encounter: Payer: Self-pay | Admitting: Family Medicine

## 2019-10-28 ENCOUNTER — Ambulatory Visit (INDEPENDENT_AMBULATORY_CARE_PROVIDER_SITE_OTHER): Payer: PPO | Admitting: Family Medicine

## 2019-10-28 VITALS — BP 128/71 | HR 63

## 2019-10-28 DIAGNOSIS — F0280 Dementia in other diseases classified elsewhere without behavioral disturbance: Secondary | ICD-10-CM | POA: Diagnosis not present

## 2019-10-28 DIAGNOSIS — G309 Alzheimer's disease, unspecified: Secondary | ICD-10-CM

## 2019-10-28 DIAGNOSIS — K219 Gastro-esophageal reflux disease without esophagitis: Secondary | ICD-10-CM

## 2019-10-28 DIAGNOSIS — E039 Hypothyroidism, unspecified: Secondary | ICD-10-CM

## 2019-10-28 DIAGNOSIS — F330 Major depressive disorder, recurrent, mild: Secondary | ICD-10-CM

## 2019-10-28 DIAGNOSIS — G250 Essential tremor: Secondary | ICD-10-CM | POA: Diagnosis not present

## 2019-10-28 DIAGNOSIS — E782 Mixed hyperlipidemia: Secondary | ICD-10-CM | POA: Diagnosis not present

## 2019-10-28 DIAGNOSIS — I10 Essential (primary) hypertension: Secondary | ICD-10-CM | POA: Diagnosis not present

## 2019-10-28 NOTE — Progress Notes (Signed)
Virtual Visit via Video Note  I connected with Lori Bell and daughter Lori Bell by telephone due to technical difficulties with the  video enabled telemedicine application and verified that I am speaking with the correct person using two identifiers.  Location patient: home Location provider:work office Persons participating in the virtual visit: patient, provider  I discussed the limitations of evaluation and management by telemedicine and the availability of in person appointments. The patient expressed understanding and agreed to proceed.    Lori Bell DOB: 1946/07/29 Encounter date: 10/28/2019  This isa 74 y.o. female who presents to establish care. Chief Complaint  Patient presents with  . Establish Care    History of present illness: On phone with daughter, Lori Bell.  "Feeling fine today"  Last visit in the fall; last bloodwork in June/July.   HTN: checks sometimes at home. Has been running pretty good. Usually running 100/75. No issues with light headed or dizzy. Last year toward middle of year and into fall she had several falls in the morning. Usually gets up 5 or 5:30. Kitchen is one step above living room. Golden [place] and one time hit head on couch. 128/71 HR 63.   Namenda, cymbalta, irbesartan-hctz at night.  Propranolol and thyroid medication in the morning.   Lives alone, but 3 daughters checking in on her daily and granddaughter lives next door.  bloodwork from 6-02/2019 stable  Dementia: was started on aricept around 2013; became very forgetful. Gradually coming on. Harder time remembering things. Writing down everything. Now to point where writing down doesn't help. They call to remind her to take medication. granddaughter lives next door. Three daughters check in very regularly with her. Doesn't cook except using toaster over. They call to help remind her to turn these off. Eats a lot of snacks in moderation. Daughters drop off meals for her. Started with aricept; then switched  to namenda. Didn't do well with the 28mg  - just seemed to make her cry all the time/side effects so they decreased to 21mg . Had been on aricept for several years and wasn't getting benefit from this, so it was stopped. Seemed to stabilize on the namenda but now feels like things are worse with memory.   Tremor: does still have some tremor sometimes; but a lot of times doesn't bother her quite as much. Propranolol was started for head tremor. Family was worried about parkinsons, but neuro saw her and dx head tremor. Propranolol stopped this.   Hypothyroid:on the 64mcg of synthroid. Has been on this about 4-5 years.   Reflux: also bout of ischemic colitis in past. Reflux well controlled. Regular daily bm.   Was on pristiq from 2012-about 2016. Started crying more, not self. Changed from pristiq to cymbalta. In 01-06-12husband died, then 74 mo later son died.   Loss of smell for close to a year.   Can't walk very far before getting fatigued. Breathes heavy with exertion. This has been gradual change. Also does sweat a lot in head.   Past Medical History:  Diagnosis Date  . Acute (reversible) ischemia of large intestine, extent unspecified ([place])   . Acute ischemic colitis ([place])   . Anxiety disorder, unspecified   . ASCVD (arteriosclerotic cardiovascular disease)   . Depression   . Essential (primary) hypertension   . Gastrointestinal hemorrhage, unspecified   . GERD (gastroesophageal reflux disease)   . GI bleeding 06/10/2017  . Hyperlipidemia   . Hypertension   . Hypothyroidism   . Hypothyroidism, unspecified   .  Major depressive disorder, single episode, unspecified   . Mixed hyperlipidemia   . Unspecified dementia without behavioral disturbance (HCC)   . Upper abdominal pain, unspecified    Past Surgical History:  Procedure Laterality Date  . BIOPSY  06/11/2017   Procedure: BIOPSY;  Surgeon: West Bali, MD;  Location: [facility];  Service: Endoscopy;;  colon  . BREAST  BIOPSY  1997   Left  . COLONOSCOPY  2003   Dr. Ewing Schlein: internal and external hemorrhoids, a few tin hyperplastic-appearing polyps not biopsed  . COLONOSCOPY WITH PROPOFOL N/A 06/11/2017   Procedure: COLONOSCOPY WITH PROPOFOL;  Surgeon: West Bali, MD;  Location: [facility];  Service: Endoscopy;  Laterality: N/A;  . INNER EAR SURGERY  1966   Right  . TOTAL ABDOMINAL HYSTERECTOMY W/ BILATERAL SALPINGOOPHORECTOMY  05/2009  . TUBAL LIGATION  1970   Allergies  Allergen Reactions  . Simvastatin    Current Meds  Medication Sig  . alendronate (FOSAMAX) 70 MG tablet Take 70 mg by mouth once a week. Take with a full glass of water on an empty stomach.  Marland Kitchen aspirin 81 MG tablet Take 81 mg by mouth at bedtime.   . Calcium Citrate-Vitamin D (CALCIUM CITRATE + PO) Take 500 mg by mouth 2 (two) times daily.   . DULoxetine (CYMBALTA) 60 MG capsule Take 60 mg by mouth daily.  . irbesartan-hydrochlorothiazide (AVALIDE) 150-12.5 MG tablet Take 1 tablet by mouth daily.  Marland Kitchen levothyroxine (SYNTHROID, LEVOTHROID) 25 MCG tablet Take 25 mcg by mouth daily before breakfast.  . Memantine HCl ER (NAMENDA XR) 21 MG CP24 Take 1 tablet by mouth at bedtime.  . Omega-3 Fatty Acids (FISH OIL) 1000 MG CAPS Take 1,000 mg by mouth daily.   . pantoprazole (PROTONIX) 40 MG tablet Take 40 mg by mouth daily.  . pravastatin (PRAVACHOL) 40 MG tablet Take 40 mg by mouth daily.  . propranolol ER (INDERAL LA) 80 MG 24 hr capsule Take 1 capsule (80 mg total) by mouth at bedtime.   Social History   Tobacco Use  . Smoking status: Never Smoker  . Smokeless tobacco: Never Used  Substance Use Topics  . Alcohol use: No    Alcohol/week: 0.0 standard drinks   Family History  Problem Relation Age of Onset  . Parkinsonism Father   . Coronary artery disease Other   . Cancer Brother   . Colon cancer Brother 76       deceased   . Dementia Brother   . Dementia Brother   . Dementia Sister      Review of Systems   Constitutional: Negative for chills, fatigue and fever.  Respiratory: Negative for cough, chest tightness, shortness of breath and wheezing.   Cardiovascular: Negative for chest pain, palpitations and leg swelling.    Objective:  BP 128/71   Pulse 63       BP Readings from Last 3 Encounters:  10/29/19 128/71  06/12/17 (!) 111/56  06/09/17 139/71   Wt Readings from Last 3 Encounters:  06/11/17 157 lb 12.8 oz (71.6 kg)  06/09/17 160 lb (72.6 kg)  04/24/16 159 lb (72.1 kg)    Physical Exam Patient sounds cheerful and well on the phone. I do not appreciate any SOB. Speech and thought processing are grossly intact. Patient reported vitals: bp 128/71 HR 63   Assessment/Plan:  1. Essential (primary) hypertension Has been well controlled although reported home pressures are lower than I would desire.  She has had a couple  falls in the last year.  I have asked him to check pressures at home and report back to me so we could potentially decrease medications if possible.  2. Hypothyroidism, unspecified type Has been stable.  Continue current thyroid medication.  We will set up for blood work for her.  3. Essential tremor Has been well controlled on the propranolol.  4. Dementia due to Alzheimer's disease [facility]) Daughter is uncertain if she still getting benefit from [place].  Encouraged them to discuss and we can consider taking off this medication since she is continuing to have cognitive decline and has been on it for more than a couple of years.  5. Mild episode of recurrent major depressive disorder (HCC) Mood has been well controlled on Cymbalta.  6. Mixed hyperlipidemia Continue with statin therapy.  We will check labs for stability - Comprehensive metabolic panel; Future - Lipid panel; Future  7. Gastroesophageal reflux disease, unspecified whether esophagitis present Well-controlled on Protonix.   I did not refer this patient for an OV in the next 24 hours for  this/these issue(s).  I discussed the assessment and treatment plan with the patient. The patient was provided an opportunity to ask questions and all were answered. The patient agreed with the plan and demonstrated an understanding of the instructions.   The patient was advised to call back or seek an in-person evaluation if the symptoms worsen or if the condition fails to improve as anticipated.  I provided 30 minutes of non-face-to-face time during this encounter.  Return for bloodwork and pending blood pressure report.  Theodis Shove, MD

## 2019-10-29 ENCOUNTER — Telehealth: Payer: Self-pay | Admitting: *Deleted

## 2019-10-29 NOTE — Telephone Encounter (Signed)
Spoke with [name] and informed her the pt has a Mychart acct that is active and to call [phone] for help with the password.  Lab appt scheduled for 3/17 to arrive at 8:10am.

## 2019-10-29 NOTE — Telephone Encounter (Signed)
-----   Message from Wynn Banker, MD sent at 10/28/2019  3:32 PM EST ----- Can you set up tammy for mychart: [email or web address]; and then set up lab visit for Taylie (not urgent)

## 2019-11-04 ENCOUNTER — Other Ambulatory Visit: Payer: PPO

## 2019-11-04 ENCOUNTER — Other Ambulatory Visit: Payer: Self-pay

## 2019-11-05 ENCOUNTER — Other Ambulatory Visit (INDEPENDENT_AMBULATORY_CARE_PROVIDER_SITE_OTHER): Payer: PPO

## 2019-11-05 DIAGNOSIS — E039 Hypothyroidism, unspecified: Secondary | ICD-10-CM

## 2019-11-05 DIAGNOSIS — E782 Mixed hyperlipidemia: Secondary | ICD-10-CM | POA: Diagnosis not present

## 2019-11-05 DIAGNOSIS — I10 Essential (primary) hypertension: Secondary | ICD-10-CM

## 2019-11-05 LAB — CBC WITH DIFFERENTIAL/PLATELET
Basophils Absolute: 0 10*3/uL (ref 0.0–0.1)
Basophils Relative: 0.8 % (ref 0.0–3.0)
Eosinophils Absolute: 0.1 10*3/uL (ref 0.0–0.7)
Eosinophils Relative: 2.4 % (ref 0.0–5.0)
HCT: 36.9 % (ref 36.0–46.0)
Hemoglobin: 12.7 g/dL (ref 12.0–15.0)
Lymphocytes Relative: 34.3 % (ref 12.0–46.0)
Lymphs Abs: 1.5 10*3/uL (ref 0.7–4.0)
MCHC: 34.4 g/dL (ref 30.0–36.0)
MCV: 89.8 fl (ref 78.0–100.0)
Monocytes Absolute: 0.4 10*3/uL (ref 0.1–1.0)
Monocytes Relative: 8.8 % (ref 3.0–12.0)
Neutro Abs: 2.4 10*3/uL (ref 1.4–7.7)
Neutrophils Relative %: 53.7 % (ref 43.0–77.0)
Platelets: 228 10*3/uL (ref 150.0–400.0)
RBC: 4.11 Mil/uL (ref 3.87–5.11)
RDW: 13.5 % (ref 11.5–15.5)
WBC: 4.5 10*3/uL (ref 4.0–10.5)

## 2019-11-05 LAB — LIPID PANEL
Cholesterol: 177 mg/dL (ref 0–200)
HDL: 58.2 mg/dL (ref >39.00)
LDL Cholesterol: 95 mg/dL (ref 0–99)
NonHDL: 118.36
Total CHOL/HDL Ratio: 3
Triglycerides: 117 mg/dL (ref 0.0–149.0)
VLDL: 23.4 mg/dL (ref 0.0–40.0)

## 2019-11-05 LAB — COMPREHENSIVE METABOLIC PANEL
ALT: 10 U/L (ref 0–35)
AST: 9 U/L (ref 0–37)
Albumin: 3.9 g/dL (ref 3.5–5.2)
Alkaline Phosphatase: 86 U/L (ref 39–117)
BUN: 12 mg/dL (ref 6–23)
CO2: 33 mEq/L — ABNORMAL HIGH (ref 19–32)
Calcium: 9.6 mg/dL (ref 8.4–10.5)
Chloride: 95 mEq/L — ABNORMAL LOW (ref 96–112)
Creatinine, Ser: 1.14 mg/dL (ref 0.40–1.20)
GFR: 46.44 mL/min — ABNORMAL LOW (ref >60.00)
Glucose, Bld: 95 mg/dL (ref 70–99)
Potassium: 4.2 mEq/L (ref 3.5–5.1)
Sodium: 135 mEq/L (ref 135–145)
Total Bilirubin: 0.6 mg/dL (ref 0.2–1.2)
Total Protein: 7 g/dL (ref 6.0–8.3)

## 2019-11-05 LAB — TSH: TSH: 3.14 u[IU]/mL (ref 0.35–4.50)

## 2019-11-11 ENCOUNTER — Telehealth: Payer: Self-pay | Admitting: *Deleted

## 2019-11-11 NOTE — Telephone Encounter (Signed)
Spoke with the patient's daughter [name] to review her own test results and she wanted to let you know the most recent BP readings for the pt which were:  105/68, 119/65, 133/73, 94/61 and 109/76.  She also wanted to let Dr Hassan Rowan know the pt has not taken Namenda since March 10th and seems so much better and "clearer".  Message sent to PCP.

## 2019-11-12 ENCOUNTER — Other Ambulatory Visit: Payer: Self-pay | Admitting: Family Medicine

## 2019-11-12 NOTE — Telephone Encounter (Signed)
Glad to hear that she is doing well off the namenda. I have taken it off of her list. If she is feeling well with current blood pressures, we don't need to change anything, but I think it would be ok to consider decreasing the propranolol to 60mg  daily if she would like with blood pressures being on lower end and her heart rates were also on lower end when we last spoke. They can continue to check and update me if they would like, but this is an option and we could set up a follow up in a few months to reassess either way.

## 2019-11-13 NOTE — Telephone Encounter (Signed)
Her hctz in in combination with the irbesartan, so we can't just stop the hctz. We could decrease the dose of the propranolol (see prior message below) which might be an easier change to make. I don't want to fully stop either as I think bp will jump too much.

## 2019-11-13 NOTE — Telephone Encounter (Signed)
Daughter would like to know if she should stop the HCTZ or the Propranolol?

## 2019-11-13 NOTE — Telephone Encounter (Signed)
Unable to leave message due to voicemail stating call cannot be completed as dialed.

## 2019-11-17 NOTE — Telephone Encounter (Signed)
Left a message for the pts daughter to return my call.   

## 2020-01-25 ENCOUNTER — Other Ambulatory Visit: Payer: Self-pay | Admitting: Family Medicine

## 2020-01-25 ENCOUNTER — Telehealth: Payer: Self-pay | Admitting: Family Medicine

## 2020-01-25 MED ORDER — DULOXETINE HCL 60 MG PO CPEP: 60.0000 mg | ORAL_CAPSULE | Freq: Every day | ORAL | 1 refills | Status: DC

## 2020-01-25 MED ORDER — LEVOTHYROXINE SODIUM 25 MCG PO TABS: 25.0000 ug | ORAL_TABLET | Freq: Every day | ORAL | 1 refills | Status: DC

## 2020-01-25 NOTE — Telephone Encounter (Signed)
sent 

## 2020-01-25 NOTE — Telephone Encounter (Signed)
DULoxetine (CYMBALTA) 60 MG capsule  levothyroxine (SYNTHROID, LEVOTHROID) 25 MCG tablet   [facility] [place], [place] - [address] Phone:  [phone]  Fax:  [phone]     90 days supply

## 2020-03-02 ENCOUNTER — Other Ambulatory Visit: Payer: Self-pay | Admitting: Family Medicine

## 2020-03-02 ENCOUNTER — Telehealth: Payer: Self-pay | Admitting: Family Medicine

## 2020-03-02 MED ORDER — ALENDRONATE SODIUM 70 MG PO TABS: 70.0000 mg | ORAL_TABLET | ORAL | 3 refills | Status: DC

## 2020-03-02 NOTE — Telephone Encounter (Signed)
Left a detailed message at the pts daughter's cell number with the information below.

## 2020-03-02 NOTE — Telephone Encounter (Signed)
I never got any requests. I wonder if they were faxing to my old office in [place]. Let daughter know I will refill today and if any trouble in future, please let us know.

## 2020-03-02 NOTE — Telephone Encounter (Signed)
Pt daughter call and stated she have sent a message to the pharmacy 3 time and they stated they have sent it to dr.Koberlein and have no got a answer back ,pt need a refill on alendronate (FOSAMAX) 70 MG tablet sent to  [facility] - [place], [place] - [address] Phone:  [phone]  Fax:  [phone]

## 2020-03-30 ENCOUNTER — Other Ambulatory Visit: Payer: Self-pay | Admitting: Family Medicine

## 2020-03-30 NOTE — Telephone Encounter (Signed)
[name], pt's daughter, stated the pt is out of this medication   Medication Refill:  Irbesartan   Pharmacy: [facility] - [place], [place] - [address] Phone:  [phone]  Fax:  [phone]

## 2020-03-31 ENCOUNTER — Other Ambulatory Visit: Payer: Self-pay | Admitting: Family Medicine

## 2020-03-31 MED ORDER — IRBESARTAN-HYDROCHLOROTHIAZIDE 150-12.5 MG PO TABS: 1.0000 | ORAL_TABLET | Freq: Every day | ORAL | 1 refills | Status: DC

## 2020-03-31 NOTE — Telephone Encounter (Signed)
Left a detailed message at the pts daughter's cell number to return a call with information as below.

## 2020-03-31 NOTE — Telephone Encounter (Signed)
[name] called back and stated the pt does need the Rx with HCTZ that was sent to [facility].  Message sent to PCP.

## 2020-03-31 NOTE — Telephone Encounter (Signed)
Noted. Thanks. That was the one I sent.

## 2020-03-31 NOTE — Telephone Encounter (Signed)
I sent refill of the irbesartan that we had in the system, but please clarify with daughter because we had an irbesartan-hctz combination so I want to make sure this is correct.

## 2020-05-02 ENCOUNTER — Encounter: Payer: Self-pay | Admitting: Family Medicine

## 2020-05-02 ENCOUNTER — Ambulatory Visit (INDEPENDENT_AMBULATORY_CARE_PROVIDER_SITE_OTHER): Payer: PPO | Admitting: Family Medicine

## 2020-05-02 ENCOUNTER — Other Ambulatory Visit: Payer: Self-pay

## 2020-05-02 VITALS — BP 120/70 | HR 63 | Temp 97.9°F | Ht 65.0 in | Wt 151.0 lb

## 2020-05-02 DIAGNOSIS — E782 Mixed hyperlipidemia: Secondary | ICD-10-CM | POA: Diagnosis not present

## 2020-05-02 DIAGNOSIS — F0280 Dementia in other diseases classified elsewhere without behavioral disturbance: Secondary | ICD-10-CM | POA: Diagnosis not present

## 2020-05-02 DIAGNOSIS — I10 Essential (primary) hypertension: Secondary | ICD-10-CM | POA: Diagnosis not present

## 2020-05-02 DIAGNOSIS — G250 Essential tremor: Secondary | ICD-10-CM | POA: Diagnosis not present

## 2020-05-02 DIAGNOSIS — Z23 Encounter for immunization: Secondary | ICD-10-CM

## 2020-05-02 DIAGNOSIS — M810 Age-related osteoporosis without current pathological fracture: Secondary | ICD-10-CM | POA: Insufficient documentation

## 2020-05-02 DIAGNOSIS — E039 Hypothyroidism, unspecified: Secondary | ICD-10-CM | POA: Diagnosis not present

## 2020-05-02 DIAGNOSIS — G309 Alzheimer's disease, unspecified: Secondary | ICD-10-CM

## 2020-05-02 DIAGNOSIS — F419 Anxiety disorder, unspecified: Secondary | ICD-10-CM | POA: Diagnosis not present

## 2020-05-02 DIAGNOSIS — K219 Gastro-esophageal reflux disease without esophagitis: Secondary | ICD-10-CM

## 2020-05-02 NOTE — Progress Notes (Signed)
Lori Bell DOB: [birth year] Encounter date: 05/02/2020  This is a 74 y.o. female who presents with Chief Complaint  Patient presents with  . Follow-up    History of present illness: More forgetful per daughter. Also seeming more tired lately. Just noting this with running errands. Just doesn't have stamina she used to. Not significant worse; more gradual decline.   Sleeping ok. No naps during day. Occasionally will drift off to sleep during day.   Daughter states that she just doesn't seem to have as much energy- like to run errands. Just not stamina to walk. Seems short of breath if trying to walk a lot.   Increased sundowning. They do ok with managing this. grandaughter right next door. Three daughters are always in and out. Only one time with wandering, but grandaughter was there; there are cameras inside and out.   Food brought in by daughters. She does microwave meals.   HTN: checks sometimes at home. Hasn't had any more falls. Blood pressure is stable. Close to today's reading.    Namenda stopped after discussion. No change since stopping.   Cymbalta still on board; mood stays happy.   , irbesartan-hctz at night.  Propranolol and thyroid medication in the morning.   Lives alone, but 3 daughters checking in on her daily and granddaughter lives next door.   Tremor: does still have some tremor sometimes; but a lot of times doesn't bother her quite as much. Propranolol was started for head tremor. Family was worried about parkinsons, but neuro saw her and dx head tremor. Propranolol stopped this. Still doing well with this.    Hypothyroid:on the of synthroid. Has been on this about 4-5 years.   Acid reflux controlled; bowels are regular. No trouble with bladder.   Can't walk very far before getting fatigued. Breathes heavy with exertion. This has been gradual change. Also does sweat a lot in head.   Allergies  Allergen Reactions  . Simvastatin    Current  Meds  Medication Sig  . alendronate (FOSAMAX) 70 MG tablet Take 1 tablet (70 mg total) by mouth once a week. Take with a full glass of water on an empty stomach.  Marland Kitchen aspirin 81 MG tablet Take 81 mg by mouth at bedtime.   Tery Sanfilippo Calcium (STOOL SOFTENER PO) Take by mouth in the morning and at bedtime.  . DULoxetine (CYMBALTA) 60 MG capsule Take 1 capsule (60 mg total) by mouth daily.  . irbesartan-hydrochlorothiazide (AVALIDE) 150-12.5 MG tablet Take 1 tablet by mouth daily.  Marland Kitchen levothyroxine (SYNTHROID) 25 MCG tablet Take 1 tablet (25 mcg total) by mouth daily before breakfast.  . pantoprazole (PROTONIX) 40 MG tablet Take 40 mg by mouth daily.  . pravastatin (PRAVACHOL) 40 MG tablet Take 40 mg by mouth daily.  . propranolol ER (INDERAL LA) 80 MG 24 hr capsule Take 1 capsule (80 mg total) by mouth at bedtime.    Review of Systems  Constitutional: Negative for chills, fatigue and fever.  Respiratory: Negative for cough, chest tightness, shortness of breath and wheezing.   Cardiovascular: Negative for chest pain, palpitations and leg swelling.       Has had pain in left breast since having biopsy years ago. Happens intermittently. Not associated with activity/exercise reproducibly.    Neurological: Negative for dizziness, light-headedness and headaches.  Psychiatric/Behavioral: Negative for sleep disturbance. The patient is not nervous/anxious.     Objective:  BP 120/70 (BP Location: Left Arm, Patient Position: Sitting, Cuff Size: Normal)  Pulse 63   Temp 97.9 F (36.6 C) (Oral)   Ht 5\' 5"  (1.651 m)   Wt 151 lb (68.5 kg)   SpO2 99%   BMI 25.13 kg/m   Weight: 151 lb (68.5 kg)   BP Readings from Last 3 Encounters:  05/02/20 120/70  10/29/19 128/71  06/12/17 (!) 111/56   Wt Readings from Last 3 Encounters:  05/02/20 151 lb (68.5 kg)  06/11/17 157 lb 12.8 oz (71.6 kg)  06/09/17 160 lb (72.6 kg)    Physical Exam Constitutional:      General: She is not in acute distress.     Appearance: She is well-developed.  Cardiovascular:     Rate and Rhythm: Normal rate and regular rhythm.     Heart sounds: Normal heart sounds. No murmur heard.  No friction rub.  Pulmonary:     Effort: Pulmonary effort is normal. No respiratory distress.     Breath sounds: Normal breath sounds. No wheezing or rales.  Musculoskeletal:     Right lower leg: No edema.     Left lower leg: No edema.  Neurological:     Mental Status: She is alert and oriented to person, place, and time.  Psychiatric:        Behavior: Behavior normal.     Assessment/Plan  1. Need for immunization against influenza - Flu Vaccine QUAD High Dose(Fluad)  2. Essential (primary) hypertension Well controlled; continue to monitor at home. Continue current medications.   3. Gastroesophageal reflux disease, unspecified whether esophagitis present Doing well with protonix. Sx are controlled.   4. Hypothyroidism, unspecified type Continue w synthroid. Has been stable. Plan to repeat bloodwork in march at next visit.   5. Essential tremor Well controlled with propranolol. Continue current medication.   6. Dementia due to Alzheimer's disease (HCC) Progressive decline. She is no longer taking namenda. Is monitored by 3 daughters and grandaughters. They provide food, check in with her more than daily. There are cameras in and out of house. She is still able to shower independently (has shower chair) and take care of ADLs without difficulty.   7. Anxiety disorder, unspecified type Well controlled. Continue with cymbalta.   8. Mixed hyperlipidemia Continue with pravastatin  9. Osteoporosis, unspecified osteoporosis type, unspecified pathological fracture presence On fosamax; last dexa was 2018. Tolerates med well. Will plan to continue at least for 5 years.     Return in about 6 months (around 10/30/2020) for physical exam.    11/01/2020, MD

## 2020-05-18 ENCOUNTER — Other Ambulatory Visit: Payer: Self-pay | Admitting: Family Medicine

## 2020-05-18 DIAGNOSIS — R443 Hallucinations, unspecified: Secondary | ICD-10-CM

## 2020-05-18 DIAGNOSIS — F0280 Dementia in other diseases classified elsewhere without behavioral disturbance: Secondary | ICD-10-CM

## 2020-05-18 DIAGNOSIS — R5383 Other fatigue: Secondary | ICD-10-CM

## 2020-05-18 DIAGNOSIS — E782 Mixed hyperlipidemia: Secondary | ICD-10-CM

## 2020-05-18 DIAGNOSIS — I10 Essential (primary) hypertension: Secondary | ICD-10-CM

## 2020-05-18 DIAGNOSIS — E538 Deficiency of other specified B group vitamins: Secondary | ICD-10-CM

## 2020-05-18 DIAGNOSIS — M810 Age-related osteoporosis without current pathological fracture: Secondary | ICD-10-CM

## 2020-05-18 NOTE — Progress Notes (Signed)
Daughter here in office states since mom's last visit, she had declined. She has hallucinated dog who passed 20 yrs ago and thought she saw man in yard petting dog/worried that he took dog. She is persistent with this memory/recall of evens although not possible. Mood seems more depressed. Melatonin at night not helping her fall asleep as easily. Harder to fall asleep. Daughter used valium one time when Rogelio was crying uncontrollably over what she thought was dog getting kidnapped. Only other psychiatric meds were pristiq which caused increased crying, and aricept/namenda which were stopped due to lack of notable benefit/longevity of use.  Will start with bloodwork/urine eval; consider further treatment pending these results.

## 2020-05-26 ENCOUNTER — Other Ambulatory Visit: Payer: Self-pay

## 2020-05-26 ENCOUNTER — Other Ambulatory Visit (INDEPENDENT_AMBULATORY_CARE_PROVIDER_SITE_OTHER): Payer: PPO

## 2020-05-26 DIAGNOSIS — G309 Alzheimer's disease, unspecified: Secondary | ICD-10-CM

## 2020-05-26 DIAGNOSIS — E782 Mixed hyperlipidemia: Secondary | ICD-10-CM | POA: Diagnosis not present

## 2020-05-26 DIAGNOSIS — F0280 Dementia in other diseases classified elsewhere without behavioral disturbance: Secondary | ICD-10-CM | POA: Diagnosis not present

## 2020-05-26 DIAGNOSIS — R5383 Other fatigue: Secondary | ICD-10-CM

## 2020-05-26 DIAGNOSIS — I10 Essential (primary) hypertension: Secondary | ICD-10-CM

## 2020-05-26 DIAGNOSIS — R443 Hallucinations, unspecified: Secondary | ICD-10-CM

## 2020-05-26 DIAGNOSIS — E538 Deficiency of other specified B group vitamins: Secondary | ICD-10-CM | POA: Diagnosis not present

## 2020-05-26 DIAGNOSIS — M810 Age-related osteoporosis without current pathological fracture: Secondary | ICD-10-CM

## 2020-05-26 LAB — POCT URINALYSIS DIPSTICK
Glucose, UA: NEGATIVE
Ketones, UA: NEGATIVE
Nitrite, UA: NEGATIVE
Protein, UA: POSITIVE — AB
Spec Grav, UA: 1.02 (ref 1.010–1.025)
Urobilinogen, UA: 0.2 E.U./dL
pH, UA: 6 (ref 5.0–8.0)

## 2020-05-26 NOTE — Addendum Note (Signed)
Addended by: Lerry Liner on: 05/26/2020 09:16 AM   Modules accepted: Orders

## 2020-05-27 ENCOUNTER — Other Ambulatory Visit: Payer: Self-pay | Admitting: Family Medicine

## 2020-05-27 LAB — COMPREHENSIVE METABOLIC PANEL
AG Ratio: 1.4 (calc) (ref 1.0–2.5)
ALT: 14 U/L (ref 6–29)
AST: 12 U/L (ref 10–35)
Albumin: 4 g/dL (ref 3.6–5.1)
Alkaline phosphatase (APISO): 69 U/L (ref 37–153)
BUN/Creatinine Ratio: 20 (calc) (ref 6–22)
BUN: 23 mg/dL (ref 7–25)
CO2: 25 mmol/L (ref 20–32)
Calcium: 9.4 mg/dL (ref 8.6–10.4)
Chloride: 99 mmol/L (ref 98–110)
Creat: 1.14 mg/dL — ABNORMAL HIGH (ref 0.60–0.93)
Globulin: 2.8 g/dL (calc) (ref 1.9–3.7)
Glucose, Bld: 129 mg/dL — ABNORMAL HIGH (ref 65–99)
Potassium: 4.8 mmol/L (ref 3.5–5.3)
Sodium: 135 mmol/L (ref 135–146)
Total Bilirubin: 0.5 mg/dL (ref 0.2–1.2)
Total Protein: 6.8 g/dL (ref 6.1–8.1)

## 2020-05-27 LAB — CBC WITH DIFFERENTIAL/PLATELET
Absolute Monocytes: 419 cells/uL (ref 200–950)
Basophils Absolute: 41 cells/uL (ref 0–200)
Basophils Relative: 0.9 %
Eosinophils Absolute: 110 cells/uL (ref 15–500)
Eosinophils Relative: 2.4 %
HCT: 34.7 % — ABNORMAL LOW (ref 35.0–45.0)
Hemoglobin: 11.3 g/dL — ABNORMAL LOW (ref 11.7–15.5)
Lymphs Abs: 1320 cells/uL (ref 850–3900)
MCH: 28.7 pg (ref 27.0–33.0)
MCHC: 32.6 g/dL (ref 32.0–36.0)
MCV: 88.1 fL (ref 80.0–100.0)
MPV: 11 fL (ref 7.5–12.5)
Monocytes Relative: 9.1 %
Neutro Abs: 2709 cells/uL (ref 1500–7800)
Neutrophils Relative %: 58.9 %
Platelets: 248 10*3/uL (ref 140–400)
RBC: 3.94 10*6/uL (ref 3.80–5.10)
RDW: 14.1 % (ref 11.0–15.0)
Total Lymphocyte: 28.7 %
WBC: 4.6 10*3/uL (ref 3.8–10.8)

## 2020-05-27 LAB — LIPID PANEL
Cholesterol: 189 mg/dL (ref <200)
HDL: 55 mg/dL (ref >=50)
LDL Cholesterol (Calc): 111 mg/dL (calc) — ABNORMAL HIGH
Non-HDL Cholesterol (Calc): 134 mg/dL (calc) — ABNORMAL HIGH (ref <130)
Total CHOL/HDL Ratio: 3.4 (calc) (ref <5.0)
Triglycerides: 120 mg/dL (ref <150)

## 2020-05-27 LAB — VITAMIN D 25 HYDROXY (VIT D DEFICIENCY, FRACTURES): Vit D, 25-Hydroxy: 27 ng/mL — ABNORMAL LOW (ref 30–100)

## 2020-05-27 LAB — TSH: TSH: 2.47 mIU/L (ref 0.40–4.50)

## 2020-05-27 LAB — VITAMIN B12: Vitamin B-12: 249 pg/mL (ref 200–1100)

## 2020-05-27 MED ORDER — CEPHALEXIN 500 MG PO CAPS: 500.0000 mg | ORAL_CAPSULE | Freq: Two times a day (BID) | ORAL | 0 refills | Status: DC

## 2020-06-01 LAB — URINE CULTURE
MICRO NUMBER:: 11055532
SPECIMEN QUALITY:: ADEQUATE

## 2020-06-01 LAB — URINALYSIS W MICROSCOPIC + REFLEX CULTURE

## 2020-06-07 ENCOUNTER — Encounter: Payer: Self-pay | Admitting: Family Medicine

## 2020-06-07 NOTE — Progress Notes (Signed)
Mychart message sent to patient//daughter for follow up. Urine culture negative for infection.

## 2020-06-17 ENCOUNTER — Other Ambulatory Visit: Payer: Self-pay | Admitting: Family Medicine

## 2020-06-17 MED ORDER — QUETIAPINE FUMARATE 25 MG PO TABS: 25.0000 mg | ORAL_TABLET | Freq: Every day | ORAL | 1 refills | Status: DC

## 2020-06-23 ENCOUNTER — Other Ambulatory Visit: Payer: Self-pay | Admitting: Family Medicine

## 2020-07-01 ENCOUNTER — Other Ambulatory Visit: Payer: Self-pay

## 2020-07-01 ENCOUNTER — Emergency Department (HOSPITAL_COMMUNITY): Payer: PPO

## 2020-07-01 ENCOUNTER — Encounter (HOSPITAL_COMMUNITY): Payer: Self-pay | Admitting: *Deleted

## 2020-07-01 ENCOUNTER — Emergency Department (HOSPITAL_COMMUNITY)
Admission: EM | Admit: 2020-07-01 | Discharge: 2020-07-02 | Disposition: A | Discharge status: Home / Self Care | Payer: PPO | Attending: Emergency Medicine | Admitting: Emergency Medicine

## 2020-07-01 DIAGNOSIS — E871 Hypo-osmolality and hyponatremia: Secondary | ICD-10-CM | POA: Diagnosis not present

## 2020-07-01 DIAGNOSIS — R0789 Other chest pain: Secondary | ICD-10-CM

## 2020-07-01 DIAGNOSIS — Z7982 Long term (current) use of aspirin: Secondary | ICD-10-CM | POA: Diagnosis not present

## 2020-07-01 DIAGNOSIS — E876 Hypokalemia: Secondary | ICD-10-CM | POA: Diagnosis not present

## 2020-07-01 DIAGNOSIS — Z20822 Contact with and (suspected) exposure to covid-19: Secondary | ICD-10-CM | POA: Diagnosis not present

## 2020-07-01 DIAGNOSIS — D649 Anemia, unspecified: Secondary | ICD-10-CM | POA: Insufficient documentation

## 2020-07-01 DIAGNOSIS — T502X5A Adverse effect of carbonic-anhydrase inhibitors, benzothiadiazides and other diuretics, initial encounter: Secondary | ICD-10-CM

## 2020-07-01 DIAGNOSIS — R0902 Hypoxemia: Secondary | ICD-10-CM | POA: Diagnosis not present

## 2020-07-01 DIAGNOSIS — R000 Tachycardia, unspecified: Secondary | ICD-10-CM | POA: Diagnosis not present

## 2020-07-01 DIAGNOSIS — R4182 Altered mental status, unspecified: Secondary | ICD-10-CM | POA: Diagnosis not present

## 2020-07-01 DIAGNOSIS — R079 Chest pain, unspecified: Secondary | ICD-10-CM | POA: Diagnosis not present

## 2020-07-01 DIAGNOSIS — J811 Chronic pulmonary edema: Secondary | ICD-10-CM | POA: Diagnosis not present

## 2020-07-01 DIAGNOSIS — E039 Hypothyroidism, unspecified: Secondary | ICD-10-CM | POA: Diagnosis not present

## 2020-07-01 DIAGNOSIS — G319 Degenerative disease of nervous system, unspecified: Secondary | ICD-10-CM | POA: Diagnosis not present

## 2020-07-01 DIAGNOSIS — I10 Essential (primary) hypertension: Secondary | ICD-10-CM | POA: Diagnosis not present

## 2020-07-01 DIAGNOSIS — G459 Transient cerebral ischemic attack, unspecified: Secondary | ICD-10-CM | POA: Diagnosis not present

## 2020-07-01 DIAGNOSIS — Z79899 Other long term (current) drug therapy: Secondary | ICD-10-CM | POA: Diagnosis not present

## 2020-07-01 DIAGNOSIS — K449 Diaphragmatic hernia without obstruction or gangrene: Secondary | ICD-10-CM | POA: Diagnosis not present

## 2020-07-01 LAB — CBC
HCT: 33 % — ABNORMAL LOW (ref 36.0–46.0)
Hemoglobin: 11.3 g/dL — ABNORMAL LOW (ref 12.0–15.0)
MCH: 29.9 pg (ref 26.0–34.0)
MCHC: 34.2 g/dL (ref 30.0–36.0)
MCV: 87.3 fL (ref 80.0–100.0)
Platelets: 290 10*3/uL (ref 150–400)
RBC: 3.78 MIL/uL — ABNORMAL LOW (ref 3.87–5.11)
RDW: 13.7 % (ref 11.5–15.5)
WBC: 6.4 10*3/uL (ref 4.0–10.5)
nRBC: 0 % (ref 0.0–0.2)

## 2020-07-01 MED ORDER — NITROGLYCERIN 0.4 MG SL SUBL: 0.4000 mg | SUBLINGUAL_TABLET | SUBLINGUAL | Status: DC | PRN

## 2020-07-01 MED ORDER — ONDANSETRON HCL 4 MG/2ML IJ SOLN
4.0000 mg | Freq: Once | INTRAMUSCULAR | Status: AC
  Administered 2020-07-01: 4 mg via INTRAVENOUS
  Filled 2020-07-01: qty 2

## 2020-07-01 NOTE — ED Triage Notes (Signed)
Pt brought in by [facility] for c/o chest pain that radiated to left side of her neck; pt having abdominal pain with vomiting; pt is very pale

## 2020-07-01 NOTE — ED Provider Notes (Signed)
[facility] EMERGENCY DEPARTMENT Provider Note   CSN: [id number] Arrival date & time: 07/01/20  2217   History Chief Complaint  Patient presents with  . Chest Pain    Lori Bell is a 74 y.o. female.  The history is provided by the patient and a relative.  Chest Pain She has history of hypertension, hyperlipidemia and comes in because of chest pain since about 3 PM. Pain is sharp and radiates to the back, left arm, neck, left side of jaw. She rates pain at 10/10. It is worse with movement, deep breath, cough. Nothing makes it better. There was some associated dyspnea, nausea, vomiting but no diaphoresis. She has taken aspirin twice-first dose 243 mg, second dose 324mg . She does not recall having had pain like this before. She is a non-smoker. There is no history of diabetes.  Past Medical History:  Diagnosis Date  . Acute (reversible) ischemia of large intestine, extent unspecified (HCC)   . Acute ischemic colitis (HCC)   . Anxiety disorder, unspecified   . ASCVD (arteriosclerotic cardiovascular disease)   . Depression   . Essential (primary) hypertension   . Gastrointestinal hemorrhage, unspecified   . GERD (gastroesophageal reflux disease)   . GI bleeding 06/10/2017  . Hyperlipidemia   . Hypertension   . Hypothyroidism   . Hypothyroidism, unspecified   . Major depressive disorder, single episode, unspecified   . Mixed hyperlipidemia   . Unspecified dementia without behavioral disturbance (HCC)   . Upper abdominal pain, unspecified     Patient Active Problem List   Diagnosis Date Noted  . Osteoporosis 05/02/2020  . Anxiety disorder, unspecified   . Major depressive disorder, single episode, unspecified   . Mixed hyperlipidemia   . Acute (reversible) ischemia of large intestine, extent unspecified (HCC)   . Essential (primary) hypertension   . Hypothyroidism, unspecified   . Unspecified dementia without behavioral disturbance (HCC)   . 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 Depression 06/11/2017  . Essential tremor 04/10/2015  . Muscle weakness (generalized) 08/28/2012  . Cardiovascular disease 06/28/2009  . GASTROESOPHAGEAL REFLUX DISEASE 06/28/2009    Past Surgical History:  Procedure Laterality Date  . BIOPSY  06/11/2017   Procedure: BIOPSY;  Surgeon: 06/13/2017, MD;  Location: [facility];  Service: Endoscopy;;  colon  . BREAST BIOPSY  1997   Left  . COLONOSCOPY  2003   Dr. 2004: internal and external hemorrhoids, a few tin hyperplastic-appearing polyps not biopsed  . COLONOSCOPY WITH PROPOFOL N/A 06/11/2017   Procedure: COLONOSCOPY WITH PROPOFOL;  Surgeon: 06/13/2017, MD;  Location: [facility];  Service: Endoscopy;  Laterality: N/A;  . INNER EAR SURGERY  1966   Right  . TOTAL ABDOMINAL HYSTERECTOMY W/ BILATERAL SALPINGOOPHORECTOMY  05/2009  . TUBAL LIGATION  1970     OB History   No obstetric history on file.     Family History  Problem Relation Age of Onset  . Parkinsonism Father   . Coronary artery disease Other   . Cancer Brother   . Colon cancer Brother 63       deceased   . Dementia Brother   . Dementia Brother   . Dementia Sister     Social History   Tobacco Use  . Smoking status: Never Smoker  . Smokeless tobacco: Never Used  Vaping Use  . Vaping Use: Never used  Substance Use Topics  . Alcohol use: No    Alcohol/week: 0.0 standard drinks  .  Drug use: No    Home Medications Prior to Admission medications   Medication Sig Start Date End Date Taking? Authorizing Provider  alendronate (FOSAMAX) 70 MG tablet Take 1 tablet (70 mg total) by mouth once a week. Take with a full glass of water on an empty stomach. 03/02/20   Wynn Banker, MD  aspirin 81 MG tablet Take 81 mg by mouth at bedtime.     [provider]  Docusate Calcium (STOOL SOFTENER PO) Take by mouth in the morning and at bedtime.    [provider]  DULoxetine (CYMBALTA) 60 MG capsule Take 1  capsule (60 mg total) by mouth daily. 01/25/20   Koberlein, Paris Lore, MD  irbesartan-hydrochlorothiazide (AVALIDE) 150-12.5 MG tablet TAKE ONE TABLET BY MOUTH ONCE DAILY. 06/23/20   Wynn Banker, MD  levothyroxine (SYNTHROID) 25 MCG tablet Take 1 tablet (25 mcg total) by mouth daily before breakfast. 01/25/20   Koberlein, Paris Lore, MD  pantoprazole (PROTONIX) 40 MG tablet TAKE ONE TABLET BY MOUTH ONCE DAILY. 06/24/20   Wynn Banker, MD  pravastatin (PRAVACHOL) 40 MG tablet Take 40 mg by mouth daily.    [provider]  propranolol ER (INDERAL LA) 80 MG 24 hr capsule TAKE (1) CAPSULE BY MOUTH AT BEDTIME. 06/24/20   Koberlein, Junell C, MD  QUEtiapine (SEROQUEL) 25 MG tablet Take 1 tablet (25 mg total) by mouth at bedtime. 06/17/20   Wynn Banker, MD    Allergies    Simvastatin  Review of Systems   Review of Systems  Cardiovascular: Positive for chest pain.  All other systems reviewed and are negative.   Physical Exam Updated Vital Signs BP (!) 149/71 (BP Location: Right Arm)   Pulse 66   Temp 97.7 F (36.5 C) (Oral)   Resp 18   Ht 5\' 5"  (1.651 m)   Wt 68 kg   SpO2 100%   BMI 24.96 kg/m   Physical Exam Vitals and nursing note reviewed.   74 year old female, resting comfortably and in no acute distress. Vital signs are significant for mildly elevated systolic blood pressure. Oxygen saturation is 100%, which is normal. Head is normocephalic and atraumatic. PERRLA, EOMI. Oropharynx is clear. Neck is nontender and supple without adenopathy or JVD. Back is nontender and there is no CVA tenderness. Lungs are clear without rales, wheezes, or rhonchi. Chest is mildly tender diffusely without crepitus. Heart has regular rate and rhythm without murmur. Abdomen is soft, flat, nontender without masses or hepatosplenomegaly and peristalsis is normoactive. Extremities have no cyanosis or edema, full range of motion is present. Skin is warm and dry without  rash. Neurologic: Mental status is normal, cranial nerves are intact, there are no motor or sensory deficits.  ED Results / Procedures / Treatments   Labs (all labs ordered are listed, but only abnormal results are displayed) Labs Reviewed  BASIC METABOLIC PANEL - Abnormal; Notable for the following components:      Result Value   Sodium 128 (*)    Potassium 3.4 (*)    Chloride 95 (*)    Glucose, Bld 117 (*)    Creatinine, Ser 1.09 (*)    GFR, Estimated 53 (*)    All other components within normal limits  CBC - Abnormal; Notable for the following components:   RBC 3.78 (*)    Hemoglobin 11.3 (*)    HCT 33.0 (*)    All other components within normal limits  D-DIMER, QUANTITATIVE (NOT AT [facility])  TROPONIN I (HIGH SENSITIVITY)  TROPONIN I (HIGH SENSITIVITY)    EKG EKG Interpretation  Date/Time:  Friday July 01 2020 22:29:42 EST Ventricular Rate:  69 PR Interval:  142 QRS Duration: 76 QT Interval:  444 QTC Calculation: 475 R Axis:   13 Text Interpretation: Normal sinus rhythm Normal ECG No STEMI When compared with ECG of 06/09/2017, No significant change was found Reconfirmed by Dione Booze ([id number]) on 07/01/2020 11:16:56 PM   Radiology DG Chest Portable 1 View  Result Date: 07/01/2020 CLINICAL DATA:  Chest pain EXAM: PORTABLE CHEST 1 VIEW COMPARISON:  02/02/2019 FINDINGS: Linear scarring or atelectasis at the bases. No consolidation or effusion. Stable cardiomediastinal silhouette with aortic atherosclerosis. Hiatal hernia. No pneumothorax. Chronic deformity of the proximal right humerus. IMPRESSION: No active disease. Linear scarring or atelectasis at the bases. Electronically Signed   By: Jasmine Pang M.D.   On: 07/01/2020 23:14    Procedures Procedures  Medications Ordered in ED Medications  nitroGLYCERIN (NITROSTAT) SL tablet 0.4 mg (has no administration in time range)  ondansetron (ZOFRAN) injection 4 mg (4 mg Intravenous Given 07/01/20 2350)  potassium  chloride SA (KLOR-CON) CR tablet 40 mEq (40 mEq Oral Given 07/02/20 0141)    ED Course  I have reviewed the triage vital signs and the nursing notes.  Pertinent labs & imaging results that were available during my care of the patient were reviewed by me and considered in my medical decision making (see chart for details).  MDM Rules/Calculators/A&P Chest pain of uncertain cause. Pain not typical for ACS. ECG is normal and chest x-ray is normal. Consider chest wall pain, pulmonary embolism. Pain not typical for aortic dissection and chest x-ray shows no abnormality of aortic profile. Will check screening labs including troponin and D-dimer. She will be given therapeutic trial of nitroglycerin. Old records are reviewed, and it is noted she had an echocardiogram in 2015 which showed grade 2 diastolic dysfunction.  Labs show mild hyponatremia which is not felt to be clinically significant, mild hypokalemia.  Electrolyte disturbances are likely secondary to diuretic use.  She is given a dose of oral potassium.  D-dimer is normal and troponin is normal x2.  Patient is reevaluated and states she is feeling much better.  She is discharged with prescription for nitroglycerin and K-Dur, instructed to follow-up with PCP.  Return precautions discussed.  Final Clinical Impression(s) / ED Diagnoses Final diagnoses:  Atypical chest pain  Normochromic normocytic anemia  Diuretic-induced hypokalemia  Hyponatremia    Rx / DC Orders ED Discharge Orders         Ordered    potassium chloride SA (KLOR-CON) 20 MEQ tablet  Daily        07/02/20 0239    nitroGLYCERIN (NITROSTAT) 0.4 MG SL tablet  Every 5 min PRN        07/02/20 0240           Dione Booze, MD 07/02/20 [phone]

## 2020-07-01 NOTE — ED Notes (Signed)
PT resting on stretcher rails up x 2 call light in hand. Pt remains a/o to person place, with family at bedside. VSS NAD, cardiac monitor applied, IV established, labs drawn, EKG completed. Dr. Preston Fleeting to bedside. Pt family states she received Aspirin 81mg  X 7 prior to arrival, NO nitro. Pt currently ranks CP 7/10 ache in nature worse with deep breath. Pt continue on room air at this time.

## 2020-07-02 ENCOUNTER — Emergency Department (HOSPITAL_COMMUNITY)
Admission: EM | Admit: 2020-07-02 | Discharge: 2020-07-02 | Disposition: A | Discharge status: Home / Self Care | Payer: PPO | Source: Home / Self Care | Attending: Emergency Medicine | Admitting: Emergency Medicine

## 2020-07-02 ENCOUNTER — Other Ambulatory Visit: Payer: Self-pay

## 2020-07-02 ENCOUNTER — Encounter (HOSPITAL_COMMUNITY): Payer: Self-pay | Admitting: Emergency Medicine

## 2020-07-02 ENCOUNTER — Emergency Department (HOSPITAL_COMMUNITY): Payer: PPO

## 2020-07-02 DIAGNOSIS — Z7982 Long term (current) use of aspirin: Secondary | ICD-10-CM | POA: Insufficient documentation

## 2020-07-02 DIAGNOSIS — F0390 Unspecified dementia without behavioral disturbance: Secondary | ICD-10-CM | POA: Insufficient documentation

## 2020-07-02 DIAGNOSIS — R4182 Altered mental status, unspecified: Secondary | ICD-10-CM | POA: Diagnosis not present

## 2020-07-02 DIAGNOSIS — R079 Chest pain, unspecified: Secondary | ICD-10-CM | POA: Insufficient documentation

## 2020-07-02 DIAGNOSIS — R0902 Hypoxemia: Secondary | ICD-10-CM | POA: Diagnosis not present

## 2020-07-02 DIAGNOSIS — E039 Hypothyroidism, unspecified: Secondary | ICD-10-CM | POA: Insufficient documentation

## 2020-07-02 DIAGNOSIS — R531 Weakness: Secondary | ICD-10-CM | POA: Insufficient documentation

## 2020-07-02 DIAGNOSIS — Z79899 Other long term (current) drug therapy: Secondary | ICD-10-CM | POA: Insufficient documentation

## 2020-07-02 DIAGNOSIS — I10 Essential (primary) hypertension: Secondary | ICD-10-CM | POA: Diagnosis not present

## 2020-07-02 DIAGNOSIS — K449 Diaphragmatic hernia without obstruction or gangrene: Secondary | ICD-10-CM | POA: Diagnosis not present

## 2020-07-02 DIAGNOSIS — I4891 Unspecified atrial fibrillation: Secondary | ICD-10-CM | POA: Diagnosis not present

## 2020-07-02 DIAGNOSIS — Z20822 Contact with and (suspected) exposure to covid-19: Secondary | ICD-10-CM | POA: Insufficient documentation

## 2020-07-02 DIAGNOSIS — J811 Chronic pulmonary edema: Secondary | ICD-10-CM | POA: Diagnosis not present

## 2020-07-02 DIAGNOSIS — G459 Transient cerebral ischemic attack, unspecified: Secondary | ICD-10-CM | POA: Diagnosis not present

## 2020-07-02 LAB — CBC WITH DIFFERENTIAL/PLATELET
Abs Immature Granulocytes: 0.02 10*3/uL (ref 0.00–0.07)
Basophils Absolute: 0 10*3/uL (ref 0.0–0.1)
Basophils Relative: 0 %
Eosinophils Absolute: 0.1 10*3/uL (ref 0.0–0.5)
Eosinophils Relative: 1 %
HCT: 35 % — ABNORMAL LOW (ref 36.0–46.0)
Hemoglobin: 12 g/dL (ref 12.0–15.0)
Immature Granulocytes: 0 %
Lymphocytes Relative: 44 %
Lymphs Abs: 2.2 10*3/uL (ref 0.7–4.0)
MCH: 30 pg (ref 26.0–34.0)
MCHC: 34.3 g/dL (ref 30.0–36.0)
MCV: 87.5 fL (ref 80.0–100.0)
Monocytes Absolute: 0.4 10*3/uL (ref 0.1–1.0)
Monocytes Relative: 7 %
Neutro Abs: 2.3 10*3/uL (ref 1.7–7.7)
Neutrophils Relative %: 48 %
Platelets: 290 10*3/uL (ref 150–400)
RBC: 4 MIL/uL (ref 3.87–5.11)
RDW: 13.6 % (ref 11.5–15.5)
WBC: 4.9 10*3/uL (ref 4.0–10.5)
nRBC: 0 % (ref 0.0–0.2)

## 2020-07-02 LAB — BASIC METABOLIC PANEL
Anion gap: 10 (ref 5–15)
Anion gap: 11 (ref 5–15)
BUN: 19 mg/dL (ref 8–23)
BUN: 20 mg/dL (ref 8–23)
CO2: 23 mmol/L (ref 22–32)
CO2: 24 mmol/L (ref 22–32)
Calcium: 9.1 mg/dL (ref 8.9–10.3)
Calcium: 9.3 mg/dL (ref 8.9–10.3)
Chloride: 95 mmol/L — ABNORMAL LOW (ref 98–111)
Chloride: 95 mmol/L — ABNORMAL LOW (ref 98–111)
Creatinine, Ser: 1.09 mg/dL — ABNORMAL HIGH (ref 0.44–1.00)
Creatinine, Ser: 1.02 mg/dL — ABNORMAL HIGH (ref 0.44–1.00)
GFR, Estimated: 53 mL/min — ABNORMAL LOW (ref >60)
GFR, Estimated: 57 mL/min — ABNORMAL LOW (ref >60)
Glucose, Bld: 117 mg/dL — ABNORMAL HIGH (ref 70–99)
Glucose, Bld: 103 mg/dL — ABNORMAL HIGH (ref 70–99)
Potassium: 3.4 mmol/L — ABNORMAL LOW (ref 3.5–5.1)
Potassium: 3.7 mmol/L (ref 3.5–5.1)
Sodium: 128 mmol/L — ABNORMAL LOW (ref 135–145)
Sodium: 130 mmol/L — ABNORMAL LOW (ref 135–145)

## 2020-07-02 LAB — TROPONIN I (HIGH SENSITIVITY)
Troponin I (High Sensitivity): 4 ng/L (ref <18)
Troponin I (High Sensitivity): 4 ng/L (ref <18)
Troponin I (High Sensitivity): 4 ng/L (ref <18)
Troponin I (High Sensitivity): 4 ng/L (ref <18)

## 2020-07-02 LAB — RESPIRATORY PANEL BY RT PCR (FLU A&B, COVID)
Influenza A by PCR: NEGATIVE
Influenza B by PCR: NEGATIVE
SARS Coronavirus 2 by RT PCR: NEGATIVE

## 2020-07-02 LAB — D-DIMER, QUANTITATIVE: D-Dimer, Quant: 0.37 ug/mL-FEU (ref 0.00–0.50)

## 2020-07-02 MED ORDER — POTASSIUM CHLORIDE CRYS ER 20 MEQ PO TBCR
40.0000 meq | EXTENDED_RELEASE_TABLET | Freq: Once | ORAL | Status: AC
  Administered 2020-07-02: 40 meq via ORAL
  Filled 2020-07-02: qty 2

## 2020-07-02 MED ORDER — IOHEXOL 300 MG/ML  SOLN
100.0000 mL | Freq: Once | INTRAMUSCULAR | Status: AC | PRN
  Administered 2020-07-02: 80 mL via INTRAVENOUS

## 2020-07-02 MED ORDER — POTASSIUM CHLORIDE CRYS ER 20 MEQ PO TBCR: 20.0000 meq | EXTENDED_RELEASE_TABLET | Freq: Every day | ORAL | 0 refills | Status: DC

## 2020-07-02 MED ORDER — NITROGLYCERIN 0.4 MG SL SUBL: 0.4000 mg | SUBLINGUAL_TABLET | SUBLINGUAL | 0 refills | Status: AC | PRN

## 2020-07-02 NOTE — Discharge Instructions (Addendum)
Return if you are having any problems. 

## 2020-07-02 NOTE — Discharge Instructions (Addendum)
Please follow up with Lori Bell's primary care provider this week for her symptoms.  I included her CT report and blood tests - please bring it to the office.  Her COVID test was NEGATIVE today.  We talked about the possibility of a mini stroke, or TIA today.  Although with fairly low suspicion for this, I would recommend that you talk to her doctor about arranging for a carotid ultrasound as an outpatient.  This can help risk stratify her mother for strokes in the future.

## 2020-07-02 NOTE — ED Triage Notes (Addendum)
Per EMS, pt was seen for same last night and was discharged this am with low potassium. Per family, generalized trembling at baseline and reports pt "not acting right at 10am." pt alert and oriented and following commands at time of arrival with EMS.speech clear.equal grips. EDP notified. cbg 145. Pt complains of chest pain, generalized weakness at time of arrival and RN assessment.

## 2020-07-02 NOTE — ED Provider Notes (Signed)
[facility] EMERGENCY DEPARTMENT Provider Note   CSN: [id number] Arrival date & time: 07/02/20  1147     History Chief Complaint  Patient presents with  . Abnormal Lab    Lori Bell is a 74 y.o. female history hypertension, hyperlipidemia, dementia, presented to emergency department with complaint of chest pain and left arm weakness.  The patient is poor historian due to her dementia, history is provided by her daughter at bedside.  The patient was reportedly seen last night in the emergency department for onset of chest pain the day before.  In the emergency department she had a chest x-ray as well as basic blood work, including a negative D-dimer, unremarkable troponin.  Unremarkable EKG.  She was discharged home.  Her daughter reports that this morning the patient woke up pain-free and feeling well.  Around approximately 930, the patient was with another family member began complaining of numbness and tingling in her entire left arm.  She also had return of her chest pain.  The other family member reported that the patient was "not able to smile".  During this episode.  The patient has a chronic speech difficulties and raspy, low voice.     HPI     Past Medical History:  Diagnosis Date  . Acute (reversible) ischemia of large intestine, extent unspecified (HCC)   . Acute ischemic colitis (HCC)   . Anxiety disorder, unspecified   . ASCVD (arteriosclerotic cardiovascular disease)   . Depression   . Essential (primary) hypertension   . Gastrointestinal hemorrhage, unspecified   . GERD (gastroesophageal reflux disease)   . GI bleeding 06/10/2017  . Hyperlipidemia   . Hypertension   . Hypothyroidism   . Hypothyroidism, unspecified   . Major depressive disorder, single episode, unspecified   . Mixed hyperlipidemia   . Unspecified dementia without behavioral disturbance (HCC)   . Upper abdominal pain, unspecified     Patient Active Problem List   Diagnosis Date Noted  .  Osteoporosis 05/02/2020  . Anxiety disorder, unspecified   . Major depressive disorder, single episode, unspecified   . Mixed hyperlipidemia   . Acute (reversible) ischemia of large intestine, extent unspecified (HCC)   . Essential (primary) hypertension   . Hypothyroidism, unspecified   . Unspecified dementia without behavioral disturbance (HCC)   . 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 Depression 06/11/2017  . Essential tremor 04/10/2015  . Muscle weakness (generalized) 08/28/2012  . Cardiovascular disease 06/28/2009  . GASTROESOPHAGEAL REFLUX DISEASE 06/28/2009    Past Surgical History:  Procedure Laterality Date  . BIOPSY  06/11/2017   Procedure: BIOPSY;  Surgeon: West Bali, MD;  Location: [facility];  Service: Endoscopy;;  colon  . BREAST BIOPSY  1997   Left  . COLONOSCOPY  2003   Dr. Ewing Schlein: internal and external hemorrhoids, a few tin hyperplastic-appearing polyps not biopsed  . COLONOSCOPY WITH PROPOFOL N/A 06/11/2017   Procedure: COLONOSCOPY WITH PROPOFOL;  Surgeon: West Bali, MD;  Location: [facility];  Service: Endoscopy;  Laterality: N/A;  . INNER EAR SURGERY  1966   Right  . TOTAL ABDOMINAL HYSTERECTOMY W/ BILATERAL SALPINGOOPHORECTOMY  05/2009  . TUBAL LIGATION  1970     OB History   No obstetric history on file.     Family History  Problem Relation Age of Onset  . Parkinsonism Father   . Coronary artery disease Other   . Cancer Brother   . Colon cancer Brother 21  deceased   . Dementia Brother   . Dementia Brother   . Dementia Sister     Social History   Tobacco Use  . Smoking status: Never Smoker  . Smokeless tobacco: Never Used  Vaping Use  . Vaping Use: Never used  Substance Use Topics  . Alcohol use: No    Alcohol/week: 0.0 standard drinks  . Drug use: No    Home Medications Prior to Admission medications   Medication Sig Start Date End Date Taking? Authorizing Provider  aspirin 81 MG tablet  Take 81 mg by mouth at bedtime.    Yes [provider]  Docusate Calcium (STOOL SOFTENER PO) Take by mouth in the morning and at bedtime.   Yes [provider]  DULoxetine (CYMBALTA) 60 MG capsule Take 1 capsule (60 mg total) by mouth daily. 01/25/20  Yes Koberlein, Paris Lore, MD  irbesartan-hydrochlorothiazide (AVALIDE) 150-12.5 MG tablet TAKE ONE TABLET BY MOUTH ONCE DAILY. 06/23/20  Yes Koberlein, Paris Lore, MD  levothyroxine (SYNTHROID) 25 MCG tablet Take 1 tablet (25 mcg total) by mouth daily before breakfast. 01/25/20  Yes Koberlein, Junell C, MD  nitroGLYCERIN (NITROSTAT) 0.4 MG SL tablet Place 1 tablet (0.4 mg total) under the tongue every 5 (five) minutes as needed for chest pain. 07/02/20  Yes Dione Booze, MD  pantoprazole (PROTONIX) 40 MG tablet TAKE ONE TABLET BY MOUTH ONCE DAILY. 06/24/20  Yes Koberlein, Junell C, MD  potassium chloride SA (KLOR-CON) 20 MEQ tablet Take 1 tablet (20 mEq total) by mouth daily. 07/02/20  Yes Dione Booze, MD  pravastatin (PRAVACHOL) 40 MG tablet Take 40 mg by mouth daily.   Yes [provider]  propranolol ER (INDERAL LA) 80 MG 24 hr capsule TAKE (1) CAPSULE BY MOUTH AT BEDTIME. Patient taking differently: Take 80 mg by mouth at bedtime.  06/24/20  Yes Koberlein, Junell C, MD  QUEtiapine (SEROQUEL) 25 MG tablet Take 1 tablet (25 mg total) by mouth at bedtime. 06/17/20  Yes Koberlein, Paris Lore, MD  alendronate (FOSAMAX) 70 MG tablet Take 1 tablet (70 mg total) by mouth once a week. Take with a full glass of water on an empty stomach. 03/02/20   Wynn Banker, MD    Allergies    Simvastatin  Review of Systems   Review of Systems  Constitutional: Negative for chills and fever.  HENT: Negative for ear pain and sore throat.   Eyes: Negative for pain and visual disturbance.  Respiratory: Negative for cough and shortness of breath.   Cardiovascular: Positive for chest pain. Negative for palpitations.  Gastrointestinal: Negative for  abdominal pain and vomiting.  Genitourinary: Negative for dysuria and hematuria.  Musculoskeletal: Negative for arthralgias and back pain.  Skin: Negative for color change and rash.  Neurological: Positive for weakness and numbness. Negative for syncope.  Psychiatric/Behavioral: Negative for agitation and confusion.  All other systems reviewed and are negative.   Physical Exam Updated Vital Signs BP 123/60   Pulse 74   Temp 98.3 F (36.8 C) (Oral)   Resp 18   Ht 5\' 5"  (1.651 m)   Wt 68 kg   SpO2 98%   BMI 24.95 kg/m   Physical Exam Vitals and nursing note reviewed.  Constitutional:      General: She is not in acute distress.    Appearance: She is well-developed.  HENT:     Head: Normocephalic and atraumatic.  Eyes:     Conjunctiva/sclera: Conjunctivae normal.  Cardiovascular:     Rate  and Rhythm: Normal rate and regular rhythm.     Pulses: Normal pulses.  Pulmonary:     Effort: Pulmonary effort is normal. No respiratory distress.  Abdominal:     General: There is no distension.     Palpations: Abdomen is soft.     Tenderness: There is no abdominal tenderness. There is no guarding.  Musculoskeletal:     Cervical back: Neck supple.  Skin:    General: Skin is warm and dry.  Neurological:     General: No focal deficit present.     Mental Status: She is alert and oriented to person, place, and time.     Cranial Nerves: No cranial nerve deficit.     Sensory: No sensory deficit.     Motor: No weakness.     ED Results / Procedures / Treatments   Labs (all labs ordered are listed, but only abnormal results are displayed) Labs Reviewed  BASIC METABOLIC PANEL - Abnormal; Notable for the following components:      Result Value   Sodium 130 (*)    Chloride 95 (*)    Glucose, Bld 103 (*)    Creatinine, Ser 1.02 (*)    GFR, Estimated 57 (*)    All other components within normal limits  CBC WITH DIFFERENTIAL/PLATELET - Abnormal; Notable for the following components:     HCT 35.0 (*)    All other components within normal limits  RESPIRATORY PANEL BY RT PCR (FLU A&B, COVID)  URINALYSIS, ROUTINE W REFLEX MICROSCOPIC  TROPONIN I (HIGH SENSITIVITY)  TROPONIN I (HIGH SENSITIVITY)    EKG EKG Interpretation  Date/Time:  Saturday July 02 2020 11:56:24 EST Ventricular Rate:  67 PR Interval:    QRS Duration: 96 QT Interval:  441 QTC Calculation: 466 R Axis:   5 Text Interpretation: Sinus rhythm Borderline low voltage, extremity leads Nonspecific T abnormalities, inferior leads No STEMI Confirmed by Alvester Chourifan, Garrison Michie [phone]) on 07/02/2020 1:01:20 PM   Radiology CT Head Wo Contrast  Result Date: 07/02/2020 CLINICAL DATA:  Left-sided facial and upper extremity numbness intermittent altered mental status EXAM: CT HEAD WITHOUT CONTRAST TECHNIQUE: Contiguous axial images were obtained from the base of the skull through the vertex without intravenous contrast. COMPARISON:  June 18, 2014 FINDINGS: Brain: There is stable mild diffuse atrophy. There is no intracranial mass, hemorrhage, extra-axial fluid collection, or midline shift. Brain parenchyma appears unremarkable. No evident acute infarct. Vascular: No hyperdense vessels. There are foci of arterial vascular calcification in the carotid siphon regions. Skull: The bony calvarium appears intact. Sinuses/Orbits: There is opacification of a posterior left ethmoid air cell. Other visualized paranasal sinuses are clear. Orbits appear symmetric bilaterally. Other: Mastoid air cells are clear. Note that mastoids are somewhat hypoplastic on the right. IMPRESSION: Mild diffuse atrophy. Brain parenchyma appears unremarkable. No mass or hemorrhage. No acute infarct. Foci of arterial vascular calcification noted. Opacity noted in a posterior left ethmoid air cell. Electronically Signed   By: Bretta BangWilliam  Woodruff III M.D.   On: 07/02/2020 14:46   DG Chest Portable 1 View  Result Date: 07/01/2020 CLINICAL DATA:  Chest pain  EXAM: PORTABLE CHEST 1 VIEW COMPARISON:  02/02/2019 FINDINGS: Linear scarring or atelectasis at the bases. No consolidation or effusion. Stable cardiomediastinal silhouette with aortic atherosclerosis. Hiatal hernia. No pneumothorax. Chronic deformity of the proximal right humerus. IMPRESSION: No active disease. Linear scarring or atelectasis at the bases. Electronically Signed   By: Jasmine PangKim  Fujinaga M.D.   On: 07/01/2020 23:14   CT  ANGIO CHEST AORTA W/CM & OR WO/CM  Result Date: 07/02/2020 CLINICAL DATA:  Chest pain no back pain. Aortic dissection suspected. EXAM: CT ANGIOGRAPHY CHEST WITH CONTRAST TECHNIQUE: Multidetector CT imaging of the chest was performed using the standard protocol during bolus administration of intravenous contrast. Multiplanar CT image reconstructions and MIPs were obtained to evaluate the vascular anatomy. CONTRAST:  65mL OMNIPAQUE IOHEXOL 300 MG/ML  SOLN COMPARISON:  None. FINDINGS: Cardiovascular: Preferential opacification of the thoracic aorta. No evidence of thoracic aortic aneurysm or dissection. Normal heart size. No pericardial effusion. Mild calcific atherosclerotic disease of the aorta. Mediastinum/Nodes: Calcified right paratracheal lymph nodes, likely due to prior granulomatous disease. Large hiatal hernia. Lungs/Pleura: Lingular airspace disease versus atelectasis. Mild haziness of the lung parenchyma dependently may represent hypoventilatory changes or mild pulmonary edema. Upper Abdomen: No acute abnormality. Musculoskeletal: No chest wall abnormality. No acute or significant osseous findings. Review of the MIP images confirms the above findings. IMPRESSION: 1. No evidence of thoracic aortic aneurysm or dissection. 2. Small area of focal airspace disease versus atelectasis in the lingula. 3. Mild haziness of the lung parenchyma dependently may represent hypoventilatory changes or mild pulmonary edema. 4. Large hiatal hernia. 5. Aortic atherosclerosis. Aortic  Atherosclerosis (ICD10-I70.0). Electronically Signed   By: Ted Mcalpine M.D.   On: 07/02/2020 14:47    Procedures Procedures (including critical care time)  Medications Ordered in ED Medications  iohexol (OMNIPAQUE) 300 MG/ML solution 100 mL (80 mLs Intravenous Contrast Given 07/02/20 1421)    ED Course  I have reviewed the triage vital signs and the nursing notes.  Pertinent labs & imaging results that were available during my care of the patient were reviewed by me and considered in my medical decision making (see chart for details).  This patient complains of chest pain and transient left arm numbness, with "inability to smile" at home this morning.  This involves an extensive number of treatment options, and is a complaint that carries with it a high risk of complications and morbidity.  The differential diagnosis includes ACS vs TIA vs hypoglycemia vs MSK pain vs reflux vs aortic dissection vs other  I ordered, reviewed, and interpreted labs, which included trop 4 -> 4 (also 4 on ED visit yesterday), Covid negative, BMP with mild hyponatremia (improved from yesterday) at 130, glucose 102, Cr 1.02, WBC 4.9, Hgb 12.0  I ordered imaging studies which included CTH and CT chest dissection study I independently visualized and interpreted imaging which showed no acute ICH or stroke, no evidence of dissection or aneurysm, no obvious PNA, and the monitor tracing which showed sinus rhythm Additional history was obtained from daughter at bedside Previous records obtained and reviewed showing ED visit yesterday, workup and labs  ECG personally reviewed showing NSR with no acute ischemic findings per my interpretation.   Clinical Course as of Jul 02 1733  Sat Jul 02, 2020  1459 IMPRESSION: Mild diffuse atrophy. Brain parenchyma appears unremarkable. No mass or hemorrhage. No acute infarct.  Foci of arterial vascular calcification noted. Opacity noted in a posterior left ethmoid air  cell.   [MT]  1521 CP has resolved.  She is asymptomatic.  Okay for discharge home with daughter.  Advised f/u with PCP this week    [MT]    Clinical Course User Index [MT] Chenel Wernli, Kermit Balo, MD    Final Clinical Impression(s) / ED Diagnoses Final diagnoses:  Chest pain, unspecified type    Rx / DC Orders ED Discharge Orders  None       Terald Sleeper, MD 07/02/20 1735

## 2020-07-02 NOTE — ED Notes (Signed)
PT educated with DC instructions and verbalized complete understanding, denies questions at this time. Pt IV removed fully intact and dressing applied. Pt to exit via wheelchair.  

## 2020-07-04 ENCOUNTER — Encounter: Payer: Self-pay | Admitting: Family Medicine

## 2020-07-04 DIAGNOSIS — G459 Transient cerebral ischemic attack, unspecified: Secondary | ICD-10-CM

## 2020-07-05 ENCOUNTER — Other Ambulatory Visit: Payer: Self-pay

## 2020-07-05 ENCOUNTER — Telehealth (INDEPENDENT_AMBULATORY_CARE_PROVIDER_SITE_OTHER): Payer: PPO | Admitting: Family Medicine

## 2020-07-05 DIAGNOSIS — R300 Dysuria: Secondary | ICD-10-CM

## 2020-07-05 MED ORDER — NITROFURANTOIN MONOHYD MACRO 100 MG PO CAPS: 100.0000 mg | ORAL_CAPSULE | Freq: Two times a day (BID) | ORAL | 0 refills | Status: DC

## 2020-07-05 NOTE — Telephone Encounter (Signed)
Spoke with the pts daughter and scheduled a virtual visit today with Dr Selena Batten as PCP is out of the office today.

## 2020-07-05 NOTE — Patient Instructions (Signed)
-  I sent the medication(s) we discussed to your pharmacy: Meds ordered this encounter  Medications   nitrofurantoin, macrocrystal-monohydrate, (MACROBID) 100 MG capsule    Sig: Take 1 capsule (100 mg total) by mouth 2 (two) times daily.    Dispense:  14 capsule    Refill:  0     I hope you are feeling better soon!  Seek in person care promptly if your symptoms worsen, new concerns arise or you are not improving with treatment.  It was nice to meet you today. I help [facility] out with telemedicine visits on Tuesdays and Thursdays and am available for visits on those days. If you have any concerns or questions following this visit please schedule a follow up visit with your Primary Care doctor or seek care at a local urgent care clinic to avoid delays in care.   

## 2020-07-05 NOTE — Progress Notes (Signed)
Virtual Visit via Telephone Note  I connected with Lori Bell on 07/05/20 at  3:00 PM EST by telephone and verified that I am speaking with the correct person using two identifiers.   I discussed the limitations, risks, security and privacy concerns of performing an evaluation and management service by telephone and the availability of in person appointments. I also discussed with the patient that there may be a patient responsible charge related to this service. The patient expressed understanding and agreed to proceed.  Location patient: home, [place] Location provider: work or home office Participants present for the call: patient, provider, daughter Patient did not have a visit with me in the prior 7 days to address this/these issue(s).   History of Present Illness:  Acute telemedicine visit for Dysuria: -Onset: started 2 days ago -Symptoms include: urgency, frequency and if does not get get to the bathroom quickly has accident, some discomfort/burning when urinate -Denies: fevers, flank pain, nausea, vomiting -she was evaluated in the hospital several times recently for reported CP, vomiting, neck pain, weakness and numbness in both legs and numbness and tingling in the L arm (daughter reports resolved by the time she got to the hospital) - daughter reports everything checked out - daughter reports she is feeling much better now after stopping her Seroquel with no CP, weakness, numbness today -daughter feel pt likely has a UTI -Has tried:nothing -Pertinent past medical history: hx of UTI, reports similar symptoms then  -Pertinent medication allergies: simvastatin -not vaccinated for [phone] - covid19 testing was negative   Observations/Objective: Patient sounds cheerful and well on the phone. I do not appreciate any SOB. Speech and thought processing are grossly intact. Patient reported vitals:  Assessment and Plan:  Dysuria  -we discussed possible serious and likely  etiologies, options for evaluation and workup, limitations of telemedicine visit vs in person visit, treatment, treatment risks and precautions.  In light of her recent trips to the emergency room and recent on symptoms, now with urinary symptoms, advised in person evaluation today.  However, pt and daughter prefer to treat via telemedicine empirically rather than in person at this moment. They refuse inperson evaluation today and prefer to address the issues from a few days ago with their PCP - they report they already contacted PCP. Daughter reports she wishes to try an antibiotic for the urinary symptoms, which do fit with a possible urinary tract infection.  I discussed alternate diagnoses, potential complications and risk of treating without exam.  I do not see any urine studies from her recent evaluations in the emergency department.  Denies chest pain, shortness of breath or symptoms like she was having several days ago.  Reports the urinary symptoms are new.  Sent Macrobid 100 mg twice daily for 7 days.  Advised they must have in person follow-up for the recent symptoms and also if any worsening of the urinary symptoms, recurrent other symptoms or new symptoms.  Daughter agrees to contact PCP for follow-up.  I sent a message to the PCP office as well to request a follow-up visit.  Advised to seek prompt in person care if worsening, new symptoms arise, or if is not improving with treatment. Advised of options for inperson care in case PCP office not available. Did let the patient know that I only do telemedicine shifts for [facility] on Tuesdays and Thursdays and advised a follow up visit with PCP or at an [facility] if has further questions or concerns.   Follow Up Instructions:  I did not refer this patient for an OV with me in the next 24 hours for this/these issue(s).  I discussed the assessment and treatment plan with the patient. The patient was provided an opportunity to ask questions and all were  answered. The patient agreed with the plan and demonstrated an understanding of the instructions.   I spent 25 minutes on this encounter.   Terressa Koyanagi, DO

## 2020-07-12 NOTE — Progress Notes (Signed)
Patient is scheduled for 08/17/2020 at 1:30 PM

## 2020-07-13 ENCOUNTER — Emergency Department
Admission: EM | Admit: 2020-07-13 | Discharge: 2020-07-13 | Disposition: A | Discharge status: Home / Self Care | Payer: PPO | Source: Home / Self Care | Attending: Family Medicine | Admitting: Family Medicine

## 2020-07-13 ENCOUNTER — Other Ambulatory Visit: Payer: Self-pay

## 2020-07-13 DIAGNOSIS — J069 Acute upper respiratory infection, unspecified: Secondary | ICD-10-CM | POA: Diagnosis not present

## 2020-07-13 DIAGNOSIS — J0100 Acute maxillary sinusitis, unspecified: Secondary | ICD-10-CM

## 2020-07-13 DIAGNOSIS — H6592 Unspecified nonsuppurative otitis media, left ear: Secondary | ICD-10-CM

## 2020-07-13 MED ORDER — AMOXICILLIN 875 MG PO TABS: ORAL_TABLET | ORAL | 0 refills | Status: DC

## 2020-07-13 NOTE — ED Provider Notes (Signed)
[facility]    CSN: [id number] Arrival date & time: 07/13/20  1503      History   Chief Complaint Chief Complaint  Patient presents with  . Otalgia  . Cough  . Eye redness    LT    HPI Lori Bell is a 74 y.o. female.   Five days ago patient developed ear/sinus congestion and cough.  Her left ear feels full with decreased hearing. She has developed mild redness and drainage from her left eye.  Yesterday she developed chills and myalgias.  Her cough is non-productive but she denies pleuritic pain and shortness of breath.  The history is provided by the patient and a relative.    Past Medical History:  Diagnosis Date  . Acute (reversible) ischemia of large intestine, extent unspecified (HCC)   . Acute ischemic colitis (HCC)   . Anxiety disorder, unspecified   . ASCVD (arteriosclerotic cardiovascular disease)   . Depression   . Essential (primary) hypertension   . Gastrointestinal hemorrhage, unspecified   . GERD (gastroesophageal reflux disease)   . GI bleeding 06/10/2017  . Hyperlipidemia   . Hypertension   . Hypothyroidism   . Hypothyroidism, unspecified   . Major depressive disorder, single episode, unspecified   . Mixed hyperlipidemia   . Unspecified dementia without behavioral disturbance (HCC)   . Upper abdominal pain, unspecified     Patient Active Problem List   Diagnosis Date Noted  . Osteoporosis 05/02/2020  . Anxiety disorder, unspecified   . Major depressive disorder, single episode, unspecified   . Mixed hyperlipidemia   . Acute (reversible) ischemia of large intestine, extent unspecified (HCC)   . Essential (primary) hypertension   . Hypothyroidism, unspecified   . Unspecified dementia without behavioral disturbance (HCC)   . 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 Depression 06/11/2017  . Essential tremor 04/10/2015  . Muscle weakness (generalized) 08/28/2012  . Cardiovascular disease 06/28/2009  .  GASTROESOPHAGEAL REFLUX DISEASE 06/28/2009    Past Surgical History:  Procedure Laterality Date  . BIOPSY  06/11/2017   Procedure: BIOPSY;  Surgeon: West Bali, MD;  Location: [facility];  Service: Endoscopy;;  colon  . BREAST BIOPSY  1997   Left  . COLONOSCOPY  2003   Dr. Ewing Schlein: internal and external hemorrhoids, a few tin hyperplastic-appearing polyps not biopsed  . COLONOSCOPY WITH PROPOFOL N/A 06/11/2017   Procedure: COLONOSCOPY WITH PROPOFOL;  Surgeon: West Bali, MD;  Location: [facility];  Service: Endoscopy;  Laterality: N/A;  . INNER EAR SURGERY  1966   Right  . TOTAL ABDOMINAL HYSTERECTOMY W/ BILATERAL SALPINGOOPHORECTOMY  05/2009  . TUBAL LIGATION  1970    OB History   No obstetric history on file.      Home Medications    Prior to Admission medications   Medication Sig Start Date End Date Taking? Authorizing Provider  alendronate (FOSAMAX) 70 MG tablet Take 1 tablet (70 mg total) by mouth once a week. Take with a full glass of water on an empty stomach. 03/02/20   Wynn Banker, MD  amoxicillin (AMOXIL) 875 MG tablet Take one tab PO Q12hr 07/13/20   Lattie Haw, MD  aspirin 81 MG tablet Take 81 mg by mouth at bedtime.     [provider]  Docusate Calcium (STOOL SOFTENER PO) Take by mouth in the morning and at bedtime.    [provider]  DULoxetine (CYMBALTA) 60 MG capsule Take 1 capsule (60  mg total) by mouth daily. 01/25/20   Koberlein, Paris Lore, MD  irbesartan-hydrochlorothiazide (AVALIDE) 150-12.5 MG tablet TAKE ONE TABLET BY MOUTH ONCE DAILY. 06/23/20   Wynn Banker, MD  levothyroxine (SYNTHROID) 25 MCG tablet Take 1 tablet (25 mcg total) by mouth daily before breakfast. 01/25/20   Koberlein, Paris Lore, MD  nitrofurantoin, macrocrystal-monohydrate, (MACROBID) 100 MG capsule Take 1 capsule (100 mg total) by mouth 2 (two) times daily. 07/05/20   Terressa Koyanagi, DO  nitroGLYCERIN (NITROSTAT) 0.4 MG SL tablet Place 1  tablet (0.4 mg total) under the tongue every 5 (five) minutes as needed for chest pain. 07/02/20   Dione Booze, MD  pantoprazole (PROTONIX) 40 MG tablet TAKE ONE TABLET BY MOUTH ONCE DAILY. 06/24/20   Koberlein, Paris Lore, MD  potassium chloride SA (KLOR-CON) 20 MEQ tablet Take 1 tablet (20 mEq total) by mouth daily. 07/02/20   Dione Booze, MD  pravastatin (PRAVACHOL) 40 MG tablet Take 40 mg by mouth daily.    [provider]  propranolol ER (INDERAL LA) 80 MG 24 hr capsule TAKE (1) CAPSULE BY MOUTH AT BEDTIME. Patient taking differently: Take 80 mg by mouth at bedtime.  06/24/20   Wynn Banker, MD  QUEtiapine (SEROQUEL) 25 MG tablet Take 1 tablet (25 mg total) by mouth at bedtime. 06/17/20   Wynn Banker, MD    Family History Family History  Problem Relation Age of Onset  . Parkinsonism Father   . Coronary artery disease Other   . Cancer Brother   . Colon cancer Brother 31       deceased   . Dementia Brother   . Dementia Brother   . Dementia Sister     Social History Social History   Tobacco Use  . Smoking status: Never Smoker  . Smokeless tobacco: Never Used  Vaping Use  . Vaping Use: Never used  Substance Use Topics  . Alcohol use: No    Alcohol/week: 0.0 standard drinks  . Drug use: No     Allergies   Simvastatin   Review of Systems Review of Systems  No sore throat + cough No pleuritic pain No wheezing + nasal congestion + post-nasal drainage + sinus pain/pressure + redness/discharge left eye ? Earache; left ear feels full No hemoptysis No SOB No fever, + chills No nausea No vomiting No abdominal pain No diarrhea No urinary symptoms No skin rash + fatigue + myalgias No headache Used OTC meds (Nyquil and Daquil) without relief    Physical Exam Triage Vital Signs ED Triage Vitals  Enc Vitals Group     BP 07/13/20 1515 110/69     Pulse Rate 07/13/20 1515 74     Resp 07/13/20 1515 17     Temp 07/13/20 1515 98.3 F (36.8  C)     Temp Source 07/13/20 1515 Oral     SpO2 07/13/20 1515 97 %     Weight --      Height --      Head Circumference --      Peak Flow --      Pain Score 07/13/20 1516 0     Pain Loc --      Pain Edu? --      Excl. in GC? --    No data found.  Updated Vital Signs BP 110/69 (BP Location: Right Arm)   Pulse 74   Temp 98.3 F (36.8 C) (Oral)   Resp 17   SpO2 97%   Visual  Acuity Right Eye Distance:   Left Eye Distance:   Bilateral Distance:    Right Eye Near:   Left Eye Near:    Bilateral Near:     Physical Exam Nursing notes and Vital Signs reviewed. Appearance:  Patient appears stated age, and in no acute distress Eyes:  Pupils are equal, round, and reactive to light and accomodation.  Extraocular movement is intact.  Conjunctivae are not inflamed.  Medial canthus of left eye has a small amount of purulent discharge at the nasolacrimal duct.  Ears:  Right tympanic membrane is scarred with no normal landmarks.  Left tympanic membrane has serous effusion. Nose:  Congested turbinates more prominent on the left.  Left maxillary sinus tenderness is present.  Pharynx:  Normal Neck:  Supple.  Mildly enlarged lateral nodes are present, tender to palpation on the left.   Lungs:  Clear to auscultation.  Breath sounds are equal.  Moving air well. Heart:  Regular rate and rhythm without murmurs, rubs, or gallops.  Abdomen:  Nontender without masses or hepatosplenomegaly.  Bowel sounds are present.  No CVA or flank tenderness.  Extremities:  No edema.  Skin:  No rash present.   UC Treatments / Results  Labs (all labs ordered are listed, but only abnormal results are displayed) Labs Reviewed - No data to display  EKG   Radiology No results found.  Procedures Procedures (including critical care time)  Medications Ordered in UC Medications - No data to display  Initial Impression / Assessment and Plan / UC Course  I have reviewed the triage vital signs and the nursing  notes.  Pertinent labs & imaging results that were available during my care of the patient were reviewed by me and considered in my medical decision making (see chart for details).    Appears to have mild left dacrocystitis also.  Begin amoxicillin. Followup with Family Doctor if not improved in one week.    Final Clinical Impressions(s) / UC Diagnoses   Final diagnoses:  Viral URI with cough  Left otitis media with effusion  Acute maxillary sinusitis, recurrence not specified     Discharge Instructions     Take plain guaifenesin (1200mg  extended release tabs such as Mucinex) twice daily, with plenty of water, for cough and congestion.  May continue Daquil. Get adequate rest.   May take Delsym Cough Suppressant ("12 Hour Cough Relief") at bedtime for nighttime cough.  Try warm salt water gargles for sore throat.  Stop all antihistamines (Nyquil, etc) for now, and other non-prescription cough/cold preparations. May take Tylenol as needed for fever, headache, etc.    ED Prescriptions    Medication Sig Dispense Auth. Provider   amoxicillin (AMOXIL) 875 MG tablet Take one tab PO Q12hr 20 tablet , MD        Lattie Haw, MD 07/13/20 [phone]

## 2020-07-13 NOTE — Discharge Instructions (Addendum)
Take plain guaifenesin (1200mg  extended release tabs such as Mucinex) twice daily, with plenty of water, for cough and congestion.  May continue Daquil. Get adequate rest.   May take Delsym Cough Suppressant ("12 Hour Cough Relief") at bedtime for nighttime cough.  Try warm salt water gargles for sore throat.  Stop all antihistamines (Nyquil, etc) for now, and other non-prescription cough/cold preparations. May take Tylenol as needed for fever, headache, etc.

## 2020-07-13 NOTE — ED Triage Notes (Signed)
Pt c/o cough, ear fullness and LT eye redness since Friday. Cough is dry. Dayquil and nyquil prn. Also Tylenol.

## 2020-07-20 ENCOUNTER — Other Ambulatory Visit: Payer: PPO

## 2020-07-26 ENCOUNTER — Other Ambulatory Visit: Payer: Self-pay | Admitting: Family Medicine

## 2020-08-01 ENCOUNTER — Other Ambulatory Visit: Payer: Self-pay | Admitting: Family Medicine

## 2020-08-01 NOTE — Telephone Encounter (Signed)
Last OV 05/02/20 Last refill - unknown Next OV 08/17/20

## 2020-08-17 ENCOUNTER — Ambulatory Visit (INDEPENDENT_AMBULATORY_CARE_PROVIDER_SITE_OTHER): Payer: PPO | Admitting: Family Medicine

## 2020-08-17 ENCOUNTER — Encounter: Payer: Self-pay | Admitting: Family Medicine

## 2020-08-17 ENCOUNTER — Other Ambulatory Visit: Payer: Self-pay

## 2020-08-17 VITALS — BP 108/58 | HR 63 | Temp 98.0°F | Ht 65.0 in | Wt 154.7 lb

## 2020-08-17 DIAGNOSIS — E039 Hypothyroidism, unspecified: Secondary | ICD-10-CM

## 2020-08-17 DIAGNOSIS — I10 Essential (primary) hypertension: Secondary | ICD-10-CM

## 2020-08-17 DIAGNOSIS — F419 Anxiety disorder, unspecified: Secondary | ICD-10-CM

## 2020-08-17 DIAGNOSIS — R413 Other amnesia: Secondary | ICD-10-CM

## 2020-08-17 DIAGNOSIS — M6281 Muscle weakness (generalized): Secondary | ICD-10-CM | POA: Diagnosis not present

## 2020-08-17 MED ORDER — MELATONIN 10 MG PO CAPS: 10.0000 mg | ORAL_CAPSULE | Freq: Every evening | ORAL | Status: DC | PRN

## 2020-08-17 NOTE — Progress Notes (Signed)
Lori Bell DOB: 1946/01/31 Encounter date: 08/17/2020  This is a 74 y.o. female who presents with Chief Complaint  Patient presents with  . Follow-up    History of present illness: Last visit with me was 05/02/2020.  At that time we discussed forgetfulness and fatigue.  Namenda was stopped as it was not felt to be helping. After patient visit, her daughter was seen by me on 05/18/2020 and expressed concerns about her mental status decline.  She was hallucinating and having some persistent recall of events which were not possible (i.e. someone stealing a dog from there he already has been deceased for 20 years).  She was having difficulty with sleeping.  She is very emotional and having significant crying spells.  We started Seroquel to see if this would help with sleep/agitation. She then had a couple episodes of severe neck pain and concerning sx for stroke; she was seen in ER. (Had two trips to ER in November - where she got drooping of mouth, trouble closing eye but this was better by time she got to hospital. Had severe pain in neck and arm as well. Had pain up left side of neck and down into left arm again on Christmas eve. Took 4 baby aspirin, talked with her and did improve with this.)  Daughter felt seroquel may be culprit for this neck pain so it was stopped. In follow up from ER she was supposed to have MRI brain and carotid US for full stroke work up. Lori Bell did not want to go and family was unable to persuade her so this was not completed.   She is here with Lori Bell today, who is her health care POA. Lori Bell states she will bring in paperwork for [place] to put on file for future. Per Lori Bell, things are not going well. Lori Bell has been more confused, more hallucinations. Recently Lori Bell is seeing her deceased husband and has concerned he is having an affair. She is less active, doing more sitting around at home. Her sleeping has done ok and she is actually getting a full nights rest now with  melatonin at bedtime. She sleeps soundly through the night.  Daughter, Lori Bell called a couple of places that are local to see about some in house care. Cost of having someone to house seems to be an issue with current insurance. Not certain what options are but feels that Lori Bell needs some help within home and someone with her during day. Lori Bell is there with her during week; other daughters are there on weekends.   Hard for her to walk across house without getting out of breathe. Gets short of breath very easily. Daughter thinks more inactivity. Currently in office she denies chest pain, pressure. She denies pain.  Allergies  Allergen Reactions  . Percocet [Oxycodone-Acetaminophen] Hives and Swelling  . Seroquel [Quetiapine Fumarate]     Neck pain  . Simvastatin    Current Meds  Medication Sig  . alendronate (FOSAMAX) 70 MG tablet Take 1 tablet (70 mg total) by mouth once a week. Take with a full glass of water on an empty stomach.  Marland Kitchen aspirin 81 MG tablet Take 81 mg by mouth at bedtime.  Lori Bell (STOOL SOFTENER PO) Take by mouth in the morning and at bedtime.  . DULoxetine (CYMBALTA) 60 MG capsule TAKE ONE CAPSULE BY MOUTH ONCE DAILY.  Marland Kitchen irbesartan-hydrochlorothiazide (AVALIDE) 150-12.5 MG tablet TAKE ONE TABLET BY MOUTH ONCE DAILY.  Marland Kitchen levothyroxine (SYNTHROID) 25 MCG tablet TAKE ONE TABLET  BY MOUTH ONCE DAILY BEFORE BREAKFAST.  . Melatonin 10 MG CAPS Take 10 mg by mouth at bedtime as needed.  . nitroGLYCERIN (NITROSTAT) 0.4 MG SL tablet Place 1 tablet (0.4 mg total) under the tongue every 5 (five) minutes as needed for chest pain.  . pantoprazole (PROTONIX) 40 MG tablet TAKE ONE TABLET BY MOUTH ONCE DAILY.  . pravastatin (PRAVACHOL) 40 MG tablet TAKE ONE TABLET BY MOUTH ONCE DAILY.  Marland Kitchen propranolol ER (INDERAL LA) 80 MG 24 hr capsule TAKE (1) CAPSULE BY MOUTH AT BEDTIME. (Patient taking differently: Take 80 mg by mouth at bedtime.)    Review of Systems  Constitutional: Negative  for chills, fatigue and fever.  Respiratory: Positive for shortness of breath (with exertion). Negative for cough, chest tightness and wheezing.   Cardiovascular: Negative for chest pain, palpitations and leg swelling.  Neurological: Negative for dizziness and headaches.  Psychiatric/Behavioral: Positive for agitation, confusion, decreased concentration and hallucinations. Self-injury: some picking behaviors.    Objective:  BP (!) 108/58 (BP Location: Left Arm, Patient Position: Sitting, Cuff Size: Large)   Pulse 63   Temp 98 F (36.7 C) (Oral)   Ht 5\' 5"  (1.651 m)   Wt 154 lb 11.2 oz (70.2 kg)   SpO2 99%   BMI 25.74 kg/m   Weight: 154 lb 11.2 oz (70.2 kg)   BP Readings from Last 3 Encounters:  08/17/20 (!) 108/58  07/13/20 110/69  07/02/20 123/60   Wt Readings from Last 3 Encounters:  08/17/20 154 lb 11.2 oz (70.2 kg)  07/02/20 149 lb 14.6 oz (68 kg)  07/01/20 150 lb (68 kg)    Physical Exam Constitutional:      General: She is not in acute distress.    Appearance: She is well-developed.  Cardiovascular:     Rate and Rhythm: Normal rate and regular rhythm.     Heart sounds: Normal heart sounds. No murmur heard. No friction rub.  Pulmonary:     Effort: Pulmonary effort is normal. No respiratory distress.     Breath sounds: Normal breath sounds. No wheezing or rales.  Musculoskeletal:     Right lower leg: No edema.     Left lower leg: No edema.  Neurological:     Mental Status: She is alert.     Comments: She is oriented to self and place. She is calm in the office but per daughter was agitated on way over because she was under the impression that our office had changed her appointment.  Hip flexors bilat 4+/5 strength; quad 4+/5, hamstring 4/5  Psychiatric:        Speech: Speech normal.        Behavior: Behavior normal.        Cognition and Memory: She exhibits impaired recent memory.     Comments: She is calm but not participating in conversation; she fidgets  with clothing and is distracted during visit.     Assessment/Plan  1. Memory loss Dementia; with episodic agitation, hallucinations. I have discussed treatment options with daughter. If we are able to control with redirecting that is ideal, however her agitation has escalated in last 6 months. Would consider trial of different antipsychotic like olanzepine if needed. We are going to look into home services. She can use help with med dispensing, meal prep, bathing as well as benefit from therapy/general home safety and strengthening. I am hopeful that home services will also be able to provide resources for daughter on other options. Lori Bell does not like  to be social and daughter states she is very difficult to get out of house, so adult day programs do not seem to be an option right now and Lori Bell wishes for Lori Bell to stay at home as long as possible.  - Ambulatory referral to Home Health  2. Essential (primary) hypertension Well controlled. Encouraged them to check at home as we may be able to lower medication if all pressures are on lower end. Certainly want to avoid hypotension.   3. Hypothyroidism, unspecified type Well controlled on synthroid dose. Could consider scaling back on this pending next lab set to simplify med regimen. Suspect she could be in normal range without mediation on board.  4. Anxiety disorder, unspecified type She has long hx of anxiety and depression. Prefer to avoid shorts acting meds. She has used valium in remote past and daughter has used this in more urgent situations with agitation episodes that they have not been able to redirect. I did send in small supply as emergency med for her, but we discussed increase risk with these meds esp with dementia.  5. Muscle weakness (generalized) Home PT ordered. Return in about 3 months (around 11/15/2020) for Chronic condition visit.   Daughter will update me with how home health services go.  We need to make sure to  have all code information on file going forward and can discuss at future visit. We discussed pros/cons of further heart/stroke eval and at this time do not feel we need to proceed with this. If she did have carotid blockage, with quick progression of memory impairment and high risk of surgery, we discussed that we would not wish to proceed with surgery/intervention. Likewise, thinking of possiblities with MRI results; we did not feel this would change management. Lori Bell had been having some falls at home previously, so I do not think she would be a good anti-coag candidate outside of her current aspirin.  45 minutes spent with chart review, time with patient and daughter, charting.  Theodis Shove, MD

## 2020-08-24 MED ORDER — DIAZEPAM 2 MG PO TABS: 2.0000 mg | ORAL_TABLET | Freq: Two times a day (BID) | ORAL | 0 refills | Status: DC | PRN

## 2020-09-01 MED ORDER — OLANZAPINE 2.5 MG PO TABS: 2.5000 mg | ORAL_TABLET | Freq: Every day | ORAL | 1 refills | Status: DC

## 2020-09-01 NOTE — Addendum Note (Signed)
Addended by: Wynn Banker on: 09/01/2020 05:55 PM   Modules accepted: Orders

## 2020-09-08 NOTE — Telephone Encounter (Signed)
I called the pts daughter Lori Bell and informed her our referral coordinator is working on finding a home health agency to help her mother.  Lori Bell stated she does not want to put her mother in a home, was upset during the night when she sent this message and has decided to move in with the pt if she has to.  Lori Bell is aware she will be contacted once I receive further information from the referral coordinator.

## 2020-09-08 NOTE — Telephone Encounter (Signed)
I also scheduled [name] for a virtual visit with Dr Selena Batten due to her positive Covid test results.

## 2020-09-09 ENCOUNTER — Telehealth: Payer: PPO | Admitting: Nurse Practitioner

## 2020-09-09 DIAGNOSIS — U071 COVID-19: Secondary | ICD-10-CM

## 2020-09-09 MED ORDER — BENZONATATE 100 MG PO CAPS: 100.0000 mg | ORAL_CAPSULE | Freq: Three times a day (TID) | ORAL | 0 refills | Status: DC | PRN

## 2020-09-09 NOTE — Progress Notes (Signed)

## 2020-09-13 ENCOUNTER — Encounter: Payer: Self-pay | Admitting: Family Medicine

## 2020-09-13 DIAGNOSIS — M6281 Muscle weakness (generalized): Secondary | ICD-10-CM

## 2020-09-13 DIAGNOSIS — F0280 Dementia in other diseases classified elsewhere without behavioral disturbance: Secondary | ICD-10-CM

## 2020-09-13 DIAGNOSIS — G309 Alzheimer's disease, unspecified: Secondary | ICD-10-CM

## 2020-09-13 MED ORDER — ALPRAZOLAM 0.25 MG PO TABS: 0.2500 mg | ORAL_TABLET | Freq: Every day | ORAL | 0 refills | Status: DC | PRN

## 2020-09-13 NOTE — Addendum Note (Signed)
Addended by: Wynn Banker on: 09/13/2020 02:43 PM   Modules accepted: Orders

## 2020-09-17 NOTE — Telephone Encounter (Signed)
Originally home health referral placed on 12/29. Patient's daughter Lori Bell hasn't heard from anyone regarding home services. Can you please look into this? Lori Bell is having a very difficult time caring for Lori Bell at home and really needs help. I re-entered the referral in case this is needed. Could you please touch base with Lori Bell to let her know the status or make sure that she hears from someone this week? Thanks!

## 2020-09-19 MED ORDER — CEPHALEXIN 500 MG PO CAPS: 500.0000 mg | ORAL_CAPSULE | Freq: Two times a day (BID) | ORAL | 0 refills | Status: AC

## 2020-09-26 ENCOUNTER — Other Ambulatory Visit: Payer: Self-pay | Admitting: Family Medicine

## 2020-09-26 NOTE — Telephone Encounter (Signed)
Referral form, last office notes and labs were faxed to [facility] at [phone].  Message sent to Gavin Pound to check on home health referral.

## 2020-09-26 NOTE — Telephone Encounter (Signed)
Form completed. Please send but [facility] is option if she wants to go with them instead of [facility]? Not sure if she contacted them yet. PLEASE check on home health. It has been over a month; what do I need to do further to make sure someone comes out to home? I Have entered the home health referral twice. Please let me know if there is more on my end. Usually families hear within the week, so i'm not sure what else needs to happen.

## 2020-09-29 ENCOUNTER — Telehealth: Payer: Self-pay | Admitting: Family Medicine

## 2020-09-29 NOTE — Telephone Encounter (Signed)
Dr. Rachael Fee is calling stating that Dr. Hassan Rowan had referred the pt to him and he has some questions and would like to see if Dr. Hassan Rowan can call him back on his cell phone when she get in on Friday 09/30/2020.

## 2020-09-30 ENCOUNTER — Encounter: Payer: Self-pay | Admitting: Family Medicine

## 2020-09-30 NOTE — Telephone Encounter (Signed)
I spoke with Dr. Larita Fife doesn't meet criteria for hospice, but they can put on their palliative care service which would allow visits at home and then can transition to hospice when time is right. I will reach out to daughter regarding this.

## 2020-10-03 ENCOUNTER — Telehealth: Payer: Self-pay

## 2020-10-03 NOTE — Telephone Encounter (Signed)
Spoke with patient's daughter [name] and scheduled an in-person Palliative Consult for 10/11/20 @ 11AM.  COVID screening was negative, Family was COVID positive approximately one month ago. Small dog in the home, will put away before NP arrives. Patient lives alone, but has 24/7 care.  Consent obtained; updated Outlook/Netsmart/Team List and Epic.  Family is aware they will be receiving a call from NP the day before or day of to confirm appointment.

## 2020-10-08 ENCOUNTER — Emergency Department (HOSPITAL_COMMUNITY): Payer: PPO

## 2020-10-08 ENCOUNTER — Encounter (HOSPITAL_COMMUNITY): Payer: Self-pay | Admitting: Emergency Medicine

## 2020-10-08 ENCOUNTER — Other Ambulatory Visit: Payer: Self-pay

## 2020-10-08 ENCOUNTER — Emergency Department (HOSPITAL_COMMUNITY)
Admission: EM | Admit: 2020-10-08 | Discharge: 2020-10-08 | Disposition: A | Discharge status: Home / Self Care | Payer: PPO | Attending: Emergency Medicine | Admitting: Emergency Medicine

## 2020-10-08 DIAGNOSIS — S0990XA Unspecified injury of head, initial encounter: Secondary | ICD-10-CM | POA: Diagnosis present

## 2020-10-08 DIAGNOSIS — G309 Alzheimer's disease, unspecified: Secondary | ICD-10-CM | POA: Diagnosis not present

## 2020-10-08 DIAGNOSIS — Y92009 Unspecified place in unspecified non-institutional (private) residence as the place of occurrence of the external cause: Secondary | ICD-10-CM | POA: Diagnosis not present

## 2020-10-08 DIAGNOSIS — Z79899 Other long term (current) drug therapy: Secondary | ICD-10-CM | POA: Insufficient documentation

## 2020-10-08 DIAGNOSIS — M542 Cervicalgia: Secondary | ICD-10-CM | POA: Diagnosis not present

## 2020-10-08 DIAGNOSIS — T07XXXA Unspecified multiple injuries, initial encounter: Secondary | ICD-10-CM | POA: Diagnosis not present

## 2020-10-08 DIAGNOSIS — Z7982 Long term (current) use of aspirin: Secondary | ICD-10-CM | POA: Diagnosis not present

## 2020-10-08 DIAGNOSIS — W01198A Fall on same level from slipping, tripping and stumbling with subsequent striking against other object, initial encounter: Secondary | ICD-10-CM | POA: Diagnosis not present

## 2020-10-08 DIAGNOSIS — R0689 Other abnormalities of breathing: Secondary | ICD-10-CM | POA: Diagnosis not present

## 2020-10-08 DIAGNOSIS — M25532 Pain in left wrist: Secondary | ICD-10-CM | POA: Diagnosis not present

## 2020-10-08 DIAGNOSIS — M25551 Pain in right hip: Secondary | ICD-10-CM | POA: Diagnosis not present

## 2020-10-08 DIAGNOSIS — F0280 Dementia in other diseases classified elsewhere without behavioral disturbance: Secondary | ICD-10-CM | POA: Diagnosis not present

## 2020-10-08 DIAGNOSIS — I10 Essential (primary) hypertension: Secondary | ICD-10-CM | POA: Diagnosis not present

## 2020-10-08 DIAGNOSIS — M25552 Pain in left hip: Secondary | ICD-10-CM | POA: Diagnosis not present

## 2020-10-08 DIAGNOSIS — Z043 Encounter for examination and observation following other accident: Secondary | ICD-10-CM | POA: Diagnosis not present

## 2020-10-08 DIAGNOSIS — R519 Headache, unspecified: Secondary | ICD-10-CM | POA: Diagnosis not present

## 2020-10-08 DIAGNOSIS — E039 Hypothyroidism, unspecified: Secondary | ICD-10-CM | POA: Diagnosis not present

## 2020-10-08 DIAGNOSIS — S6992XA Unspecified injury of left wrist, hand and finger(s), initial encounter: Secondary | ICD-10-CM | POA: Diagnosis not present

## 2020-10-08 DIAGNOSIS — W19XXXA Unspecified fall, initial encounter: Secondary | ICD-10-CM

## 2020-10-08 DIAGNOSIS — S0083XA Contusion of other part of head, initial encounter: Secondary | ICD-10-CM | POA: Diagnosis not present

## 2020-10-08 DIAGNOSIS — M25539 Pain in unspecified wrist: Secondary | ICD-10-CM | POA: Diagnosis not present

## 2020-10-08 NOTE — Discharge Instructions (Addendum)
Please use Lori Bell's walker at home whenever she is moving around.  Make sure someone is supervising her as she gets up and moves about for the next few days.

## 2020-10-08 NOTE — ED Triage Notes (Signed)
Pt from home via [facility]. Pt with C/O fall and hitting her head. Per family the patient is "more drowsy." Pt with hx of dementia.

## 2020-10-08 NOTE — ED Provider Notes (Signed)
[facility]   CSN: [id number] Arrival date & time: 10/08/20  1317     History Chief Complaint  Patient presents with   Randol KernFall    Lori Bell is a 75 y.o. female with a history of advanced dementia presenting from home with a mechanical fall.  She is here with her daughter who witnessed the fall.  Her daughter says the patient's feet got tangled up in a cord at home and she fell backwards, landing on her back and possibly striking her head.  Her daughter says she was concerned as the patient seemed to be unresponsive afterwards.  She called EMS who brought the patient to the ED.  The patient is now awake and back towards her mental baseline status.  She is very advanced dementia.  She denies to me that she has a headache or any pain in her back, but when I palpate her back she tells me it hurts.  She is not on blood thinners per her daughter's report.  HPI     Past Medical History:  Diagnosis Date   Acute (reversible) ischemia of large intestine, extent unspecified (HCC)    Acute ischemic colitis (HCC)    Anxiety disorder, unspecified    ASCVD (arteriosclerotic cardiovascular disease)    Depression    Essential (primary) hypertension    Gastrointestinal hemorrhage, unspecified    GERD (gastroesophageal reflux disease)    GI bleeding 06/10/2017   Hyperlipidemia    Hypertension    Hypothyroidism    Hypothyroidism, unspecified    Major depressive disorder, single episode, unspecified    Mixed hyperlipidemia    Unspecified dementia without behavioral disturbance (HCC)    Upper abdominal pain, unspecified     Patient Active Problem List   Diagnosis Date Noted   Osteoporosis 05/02/2020   Anxiety disorder, unspecified    Major depressive disorder, single episode, unspecified    Mixed hyperlipidemia    Acute (reversible) ischemia of large intestine, extent unspecified (HCC)    Essential (primary) hypertension     Hypothyroidism, unspecified    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 Ewing SchleinMagod: internal and external hemorrhoids, a few tin hyperplastic-appearing polyps not biopsed   COLONOSCOPY WITH PROPOFOL N/A 06/11/2017   Procedure: COLONOSCOPY WITH PROPOFOL;  Surgeon: West BaliFields, Sandi L, MD;  Location: [facility];  Service: Endoscopy;  Laterality: N/A;   INNER EAR SURGERY  1966   Right   TOTAL ABDOMINAL HYSTERECTOMY W/ BILATERAL SALPINGOOPHORECTOMY  05/2009   TUBAL LIGATION  1970     OB History   No obstetric history on file.     Family History  Problem Relation Age of Onset   Parkinsonism Father    Coronary artery disease Other    Cancer Brother    Colon cancer Brother [id number]       deceased    Dementia Brother    Dementia Brother    Dementia Sister     Social History   Tobacco Use   Smoking status: Never Smoker   Smokeless  tobacco: Never Used  Vaping Use   Vaping Use: Never used  Substance Use Topics   Alcohol use: No    Alcohol/week: 0.0 standard drinks   Drug use: No    Home Medications Prior to Admission medications   Medication Sig Start Date End Date Taking? Authorizing Provider  alendronate (FOSAMAX) 70 MG tablet Take 1 tablet (70 mg total) by mouth once a week. Take with a full glass of water on an empty stomach. 03/02/20   Wynn Banker, MD  ALPRAZolam (XANAX) 0.25 MG tablet Take 1 tablet (0.25 mg total) by mouth daily as needed for anxiety. 09/13/20   Wynn Banker, MD  aspirin 81 MG tablet Take 81 mg by  mouth at bedtime.    [provider]  benzonatate (TESSALON PERLES) 100 MG capsule Take 1 capsule (100 mg total) by mouth 3 (three) times daily as needed. 09/09/20   Daphine Deutscher Mary-Margaret, FNP  Docusate Calcium (STOOL SOFTENER PO) Take by mouth in the morning and at bedtime.    [provider]  DULoxetine (CYMBALTA) 60 MG capsule TAKE ONE CAPSULE BY MOUTH ONCE DAILY. 07/26/20   Koberlein, Paris Lore, MD  irbesartan-hydrochlorothiazide (AVALIDE) 150-12.5 MG tablet TAKE ONE TABLET BY MOUTH ONCE DAILY. 06/23/20   Wynn Banker, MD  levothyroxine (SYNTHROID) 25 MCG tablet TAKE ONE TABLET BY MOUTH ONCE DAILY BEFORE BREAKFAST. 08/01/20   Wynn Banker, MD  Melatonin 10 MG CAPS Take 10 mg by mouth at bedtime as needed. 08/17/20   Wynn Banker, MD  nitroGLYCERIN (NITROSTAT) 0.4 MG SL tablet Place 1 tablet (0.4 mg total) under the tongue every 5 (five) minutes as needed for chest pain. 07/02/20   Dione Booze, MD  OLANZapine (ZYPREXA) 2.5 MG tablet Take 1 tablet (2.5 mg total) by mouth at bedtime. 09/01/20   Koberlein, Paris Lore, MD  pantoprazole (PROTONIX) 40 MG tablet TAKE ONE TABLET BY MOUTH ONCE DAILY. 06/24/20   Koberlein, Paris Lore, MD  pravastatin (PRAVACHOL) 40 MG tablet TAKE ONE TABLET BY MOUTH ONCE DAILY. 08/08/20   Koberlein, Paris Lore, MD  propranolol ER (INDERAL LA) 80 MG 24 hr capsule TAKE (1) CAPSULE BY MOUTH AT BEDTIME. 09/29/20   Koberlein, Junell C, MD  QUEtiapine (SEROQUEL) 25 MG tablet Take 1 tablet (25 mg total) by mouth at bedtime. 06/17/20 07/13/20  Wynn Banker, MD    Allergies    Percocet [oxycodone-acetaminophen], Seroquel [quetiapine fumarate], and Simvastatin  Review of Systems   Review of Systems  Unable to perform ROS: Dementia (level 5 caveat)    Physical Exam Updated Vital Signs BP (!) 124/59    Pulse 63    Temp 98.4 F (36.9 C)    Resp 17    SpO2 97%   Physical Exam Constitutional:      General: She is not in acute  distress. HENT:     Head: Normocephalic.     Comments: Small hematoma to left forehead Eyes:     Conjunctiva/sclera: Conjunctivae normal.     Pupils: Pupils are equal, round, and reactive to light.  Cardiovascular:     Rate and Rhythm: Normal rate and regular rhythm.  Pulmonary:     Effort: Pulmonary effort is normal. No respiratory distress.  Abdominal:     General: There is no distension.     Tenderness: There is no abdominal tenderness.  Musculoskeletal:     Comments: Lower c spine, upper T spine and lower L spine midline tenderness Full ROM of the bilateral  hips with pain  Skin:    General: Skin is warm and dry.  Neurological:     General: No focal deficit present.     Mental Status: She is alert. Mental status is at baseline.     ED Results / Procedures / Treatments   Labs (all labs ordered are listed, but only abnormal results are displayed) Labs Reviewed - No data to display  EKG None  Radiology CT Head Wo Contrast  Result Date: 10/08/2020 CLINICAL DATA:  75 year old female with headache and neck pain following fall. Initial encounter. EXAM: CT HEAD WITHOUT CONTRAST CT CERVICAL SPINE WITHOUT CONTRAST TECHNIQUE: Multidetector CT imaging of the head and cervical spine was performed following the standard protocol without intravenous contrast. Multiplanar CT image reconstructions of the cervical spine were also generated. COMPARISON:  07/02/2020 head CT FINDINGS: CT HEAD FINDINGS Brain: No evidence of acute infarction, hemorrhage, hydrocephalus, extra-axial collection or mass lesion/mass effect. Mild chronic small-vessel white matter ischemic changes are again noted. Vascular: Carotid atherosclerotic calcifications are noted. Skull: Normal. Negative for fracture or focal lesion. Sinuses/Orbits: Bilateral mastoid effusions are noted. Other: Mild RIGHT scalp soft tissue swelling is identified. CT CERVICAL SPINE FINDINGS Alignment: Normal. Skull base and vertebrae: No acute  fracture. No primary bone lesion or focal pathologic process. Chronic irregularity of the odontoid tip again noted. Soft tissues and spinal canal: No prevertebral fluid or swelling. No visible canal hematoma. Disc levels:  Mild multilevel degenerative disc disease noted. Upper chest: No acute abnormality Other: None IMPRESSION: 1. No evidence of acute intracranial abnormality. Mild RIGHT scalp soft tissue swelling without fracture. 2. Bilateral mastoid effusions. 3. No static evidence of acute injury to the cervical spine. Electronically Signed   By: Harmon Pier M.D.   On: 10/08/2020 16:38   CT Cervical Spine Wo Contrast  Result Date: 10/08/2020 CLINICAL DATA:  75 year old female with headache and neck pain following fall. Initial encounter. EXAM: CT HEAD WITHOUT CONTRAST CT CERVICAL SPINE WITHOUT CONTRAST TECHNIQUE: Multidetector CT imaging of the head and cervical spine was performed following the standard protocol without intravenous contrast. Multiplanar CT image reconstructions of the cervical spine were also generated. COMPARISON:  07/02/2020 head CT FINDINGS: CT HEAD FINDINGS Brain: No evidence of acute infarction, hemorrhage, hydrocephalus, extra-axial collection or mass lesion/mass effect. Mild chronic small-vessel white matter ischemic changes are again noted. Vascular: Carotid atherosclerotic calcifications are noted. Skull: Normal. Negative for fracture or focal lesion. Sinuses/Orbits: Bilateral mastoid effusions are noted. Other: Mild RIGHT scalp soft tissue swelling is identified. CT CERVICAL SPINE FINDINGS Alignment: Normal. Skull base and vertebrae: No acute fracture. No primary bone lesion or focal pathologic process. Chronic irregularity of the odontoid tip again noted. Soft tissues and spinal canal: No prevertebral fluid or swelling. No visible canal hematoma. Disc levels:  Mild multilevel degenerative disc disease noted. Upper chest: No acute abnormality Other: None IMPRESSION: 1. No evidence  of acute intracranial abnormality. Mild RIGHT scalp soft tissue swelling without fracture. 2. Bilateral mastoid effusions. 3. No static evidence of acute injury to the cervical spine. Electronically Signed   By: Harmon Pier M.D.   On: 10/08/2020 16:38   CT Thoracic Spine Wo Contrast  Result Date: 10/08/2020 CLINICAL DATA:  Status post fall. EXAM: CT THORACIC SPINE WITHOUT CONTRAST TECHNIQUE: Multidetector CT images of the thoracic were obtained using the standard protocol without intravenous contrast. COMPARISON:  None. FINDINGS: Alignment: Maintained. Vertebrae: No fracture or focal pathologic process. Paraspinal and other soft tissues: Calcific aortic and coronary atherosclerosis seen. Moderate  hiatal hernia. Disc levels: Intervertebral disc space height is maintained. IMPRESSION: Normal appearing thoracic spine. Calcific coronary artery disease. Moderate hiatal hernia. Aortic Atherosclerosis (ICD10-I70.0). Electronically Signed   By: Drusilla Kanner M.D.   On: 10/08/2020 16:25   CT Lumbar Spine Wo Contrast  Result Date: 10/08/2020 CLINICAL DATA:  Status post fall. EXAM: CT LUMBAR SPINE WITHOUT CONTRAST TECHNIQUE: Multidetector CT imaging of the lumbar spine was performed without intravenous contrast administration. Multiplanar CT image reconstructions were also generated. COMPARISON:  CT abdomen and pelvis 02/02/2004. FINDINGS: Segmentation: Standard. Alignment: Normal. Vertebrae: No fracture or focal lesion. Paraspinal and other soft tissues: 2.2 x 2.3 cm right adrenal adenoma is unchanged. Aortic atherosclerosis noted. Disc levels: Intervertebral disc space height is maintained. IMPRESSION: Negative lumbar spine CT. Unchanged benign right adrenal adenoma. Aortic Atherosclerosis (ICD10-I70.0). Electronically Signed   By: Drusilla Kanner M.D.   On: 10/08/2020 16:30    Procedures Procedures   Medications Ordered in ED Medications - No data to display  ED Course  I have reviewed the triage vital  signs and the nursing notes.  Pertinent labs & imaging results that were available during my care of the patient were reviewed by me and considered in my medical decision making (see chart for details).  Mechanical fall at home CT imaging ordered per findings on exam - hx of "chronic fracture" in the back per daughter's report. At baseline patient walks without walker.  Low suspicion for extremity fractures or hip fracture She is pleasantly demented on exam and appears comfortably  Supplemental hx from her daughter at bedside  Ambulated in ED  Clinical Course as of 10/09/20 1115  Sat Oct 08, 2020  1704 No acute traumatic findings on CT.  Okay to discharge with daughter.  Encouraged walker use for next several days - they have one at home. [MT]    Clinical Course User Index [MT] Nehemiah Montee, Kermit Balo, MD    Final Clinical Impression(s) / ED Diagnoses Final diagnoses:  Fall, initial encounter    Rx / DC Orders ED Discharge Orders    None       Matalyn Nawaz, Kermit Balo, MD 10/09/20 1115

## 2020-10-11 ENCOUNTER — Other Ambulatory Visit: Payer: Self-pay

## 2020-10-11 ENCOUNTER — Other Ambulatory Visit: Payer: Self-pay | Admitting: Nurse Practitioner

## 2020-10-11 DIAGNOSIS — Z515 Encounter for palliative care: Secondary | ICD-10-CM

## 2020-10-11 DIAGNOSIS — R52 Pain, unspecified: Secondary | ICD-10-CM | POA: Diagnosis not present

## 2020-10-11 NOTE — Progress Notes (Signed)
[facility] Consult Note Telephone: [phone]  Fax: [phone]  PATIENT NAME: Lori Bell [id number] [phone] (home)  DOB: [birth year] MRN: [id number]  PRIMARY CARE PROVIDER:    Caren Macadam, MD,  [place] [address] [phone]  REFERRING PROVIDER:   Caren Macadam, [place],  [place] [address] [phone]  RESPONSIBLE PARTY:   Extended Emergency Contact Information Primary Emergency Contact: Bell,Lori Address: [address]          [place],  [address] [place] of [place] Mobile Phone: [phone] Relation: Daughter Secondary Emergency Contact: Lori Bell [place] of [place] Phone: [phone] Relation: Daughter  I met face to face with patient and daughter Lori in home,.  ASSESSMENT AND RECOMMENDATIONS:   Advance Care Planning: ACP discussion held mostly with daughter as patient could not substantially engage is discussion due to poor cognition related to dementia.   Today's visit consisted of building trust and discussions on Palliative care medicine as a specialized medical care for people living with serious illness, aimed at facilitating improved quality of life through symptoms relief, assisting with advance care planning and establishing goals of care. Daughter expressed appreciation for education provided on Palliative care and how it differs from Hospice service.  Goal of care: Patient's goal of care is comfort, to keep patient in home. Family want her to be happy and comfortable at home.  Directives: Daughter report patient has an advance directive. She does not desire resuscitation in the event of cardiac or respiratory arrest. DNR form signed for patient today, copy uploaded to [facility] EMR. The need to complete a MOST form was discussed, daughter expressed interest in completing a  MOST form, she however deferred the completion of the form to next visit, saying she needs time to review the form with her siblings.  Symptom Management:  Left wrist pain: Site tender to deep palpation. No swelling, no erythema, or discoloration, free ROM to the joint. No report of fever, chills, or SOB. Recommendation: Continue current plan of care, with rest and Tylenol 659m every 4-6 hrs as needed. Daughter advised to monitor for worsening pain or reduced function, consider x-ray of the joint if symptoms persists or worsens. Daughter to notify PCP. Chest pain: last ED visit for chest pain was 07/02/2020, had 2 ED visit for chest pain within one month, of which her diagnostics were unremarkable, she however has a history of grade 2 diastolic dysfunction, daughter report symptoms is now mostly controled with Nitroglycerin SL as needed. Patient denied chest pain today. Reviewed CT scan thoracic spine completed on 10/08/2020, patient noted with Aortic Atherosclerosis and moderate hiatal hernia. Recommendation: continue plan of care, advised to seek emergency care if symptoms not relived with sublingual Nitro.  Dementia: FAST score 5. No report of uncontrolled behavior concerns, daughter report occasional agitation relieved with Xanax 0.228mas needed.  Recommendation: continue current plan of care with Xanax 0.2518maily as needed. Maintain safety, prevent falls. Discussion on disease trajectory of dementia with daughter, as it is progressive and terminal and likely to eventually lead to dysphagia, weight loss and or immobility. Provided and reviewed information on Dementia time line. Emotional and supportive care provided. Palliative care will continue to provide support to patient, family and the medical team.  Follow up Palliative Care Visit: Palliative care will continue to follow for complex decision making and symptom management. Return in about 4  weeks or prn.  Family /Caregiver/Community  Supports: Patient lives at home, her daughter Lori Bell moved in this week to live with her. Patient has 2 other daughters. Lori is her main caregiver.  Cognitive / Functional decline:  Patient awake and alert, she is however pleasantly confused. She is dependent on her daughter for bathing and dressing, able to feed self. She walks independently within the home, uses cane occasionally, gait is unstable, has frequent falls. last fall was 3 days ago.   I spent 46 minutes providing this consultation, time includes time spent with patient and family, chart review, provider coordination, and documentation. More than 50% of the time in this consultation was spent counseling and coordinating communication.   CHIEF COMPLAINT: left wrist pain  History obtained from review of EMR, discussion with family. Records reviewed and summarized bellow.  HISTORY OF PRESENT ILLNESS:  Lori Bell is a 75 y.o. year old female with multiple medical problems including Alzheimer dementia without behavior disturbance (FAST 5), CAD, major depression, HTN, GERD, HLD, osteoporosis, hypothyroidism. Patient with problem of frequent falls due to unsteady gait, last fall was 3 days ago, daughter report patient complained of left wrist pain. Palliative Care was asked to follow this patient by consultation request of Caren Macadam, MD to help address advance care planning, goals of care and with symptoms management. This is an initail visit.  CODE STATUS: DNR  PPS: 50%  HOSPICE ELIGIBILITY/DIAGNOSIS: TBD  ROS Unable to complete ROS due to advance dementia   Physical Exam: Current and past weights: 157lbs, no report of weight loss, Ht 48f5", BMI  Constitutional: NAD General: frail appearing, coperative EYES: anicteric sclera, lids intact, no discharge  ENMT: intact hearing, oral mucous membranes moist CV:  no LE edema Pulmonary: no increased work of breathing, no cough, no audible wheezes, room air, 99% on room  air Abdomen:  no ascites GU: deferred MSK:  free ROM to left wrist, no contractures, ambulatory Skin: warm and dry, no rashes or wounds on visible skin Neuro: Generalized weakness, moderate cognitive impairment Psych: non-anxious affect today, A and O x 1 Hem/lymph/immuno: no widespread bruising   PAST MEDICAL HISTORY:  Past Medical History:  Diagnosis Date  . Acute (reversible) ischemia of large intestine, extent unspecified ([place]   . Acute ischemic colitis ([place]   . Anxiety disorder, unspecified   . ASCVD (arteriosclerotic cardiovascular disease)   . Depression   . Essential (primary) hypertension   . Gastrointestinal hemorrhage, unspecified   . GERD (gastroesophageal reflux disease)   . GI bleeding 06/10/2017  . Hyperlipidemia   . Hypertension   . Hypothyroidism   . Hypothyroidism, unspecified   . Major depressive disorder, single episode, unspecified   . Mixed hyperlipidemia   . Unspecified dementia without behavioral disturbance ([place]   . Upper abdominal pain, unspecified     SOCIAL HX:  Social History   Tobacco Use  . Smoking status: Never Smoker  . Smokeless tobacco: Never Used  Substance Use Topics  . Alcohol use: No    Alcohol/week: 0.0 standard drinks   FAMILY HX:  Family History  Problem Relation Age of Onset  . Parkinsonism Father   . Coronary artery disease Other   . Cancer Brother   . Colon cancer Brother [phone]      deceased   . Dementia Brother   . Dementia Brother   . Dementia Sister     ALLERGIES:  Allergies  Allergen Reactions  . Percocet [Oxycodone-Acetaminophen] Hives and  Swelling  . Seroquel [Quetiapine Fumarate]     Neck pain  . Simvastatin      PERTINENT MEDICATIONS:  Outpatient Encounter Medications as of 10/11/2020  Medication Sig  . alendronate (FOSAMAX) 70 MG tablet Take 1 tablet (70 mg total) by mouth once a week. Take with a full glass of water on an empty stomach.  . ALPRAZolam (XANAX) 0.25 MG tablet Take 1 tablet (0.25 mg  total) by mouth daily as needed for anxiety.  Marland Kitchen aspirin 81 MG tablet Take 81 mg by mouth at bedtime.  . benzonatate (TESSALON PERLES) 100 MG capsule Take 1 capsule (100 mg total) by mouth 3 (three) times daily as needed.  Mariane Baumgarten Calcium (STOOL SOFTENER PO) Take by mouth in the morning and at bedtime.  . DULoxetine (CYMBALTA) 60 MG capsule TAKE ONE CAPSULE BY MOUTH ONCE DAILY.  Marland Kitchen irbesartan-hydrochlorothiazide (AVALIDE) 150-12.5 MG tablet TAKE ONE TABLET BY MOUTH ONCE DAILY.  Marland Kitchen levothyroxine (SYNTHROID) 25 MCG tablet TAKE ONE TABLET BY MOUTH ONCE DAILY BEFORE BREAKFAST.  . Melatonin 10 MG CAPS Take 10 mg by mouth at bedtime as needed.  . nitroGLYCERIN (NITROSTAT) 0.4 MG SL tablet Place 1 tablet (0.4 mg total) under the tongue every 5 (five) minutes as needed for chest pain.  Marland Kitchen OLANZapine (ZYPREXA) 2.5 MG tablet Take 1 tablet (2.5 mg total) by mouth at bedtime.  . pantoprazole (PROTONIX) 40 MG tablet TAKE ONE TABLET BY MOUTH ONCE DAILY.  . pravastatin (PRAVACHOL) 40 MG tablet TAKE ONE TABLET BY MOUTH ONCE DAILY.  Marland Kitchen propranolol ER (INDERAL LA) 80 MG 24 hr capsule TAKE (1) CAPSULE BY MOUTH AT BEDTIME.  . [DISCONTINUED] QUEtiapine (SEROQUEL) 25 MG tablet Take 1 tablet (25 mg total) by mouth at bedtime.   No facility-administered encounter medications on file as of 10/11/2020.    Thank you for the opportunity to participate in the care of Ms. Brandt Loosen The palliative care team will continue to follow. Please call our office at [phone] if we can be of additional assistance.  Jari Favre, DNP, AGPCNP-BC

## 2020-10-21 ENCOUNTER — Encounter: Payer: Self-pay | Admitting: Family Medicine

## 2020-10-24 ENCOUNTER — Other Ambulatory Visit: Payer: Self-pay | Admitting: Family Medicine

## 2020-10-24 ENCOUNTER — Ambulatory Visit: Payer: PPO | Admitting: Family Medicine

## 2020-10-24 ENCOUNTER — Other Ambulatory Visit: Payer: Self-pay

## 2020-10-24 DIAGNOSIS — I10 Essential (primary) hypertension: Secondary | ICD-10-CM

## 2020-10-24 DIAGNOSIS — E039 Hypothyroidism, unspecified: Secondary | ICD-10-CM

## 2020-10-24 DIAGNOSIS — M810 Age-related osteoporosis without current pathological fracture: Secondary | ICD-10-CM

## 2020-10-24 DIAGNOSIS — I2510 Atherosclerotic heart disease of native coronary artery without angina pectoris: Secondary | ICD-10-CM

## 2020-10-24 DIAGNOSIS — E782 Mixed hyperlipidemia: Secondary | ICD-10-CM

## 2020-10-24 DIAGNOSIS — R600 Localized edema: Secondary | ICD-10-CM

## 2020-10-24 MED ORDER — FUROSEMIDE 20 MG PO TABS: 20.0000 mg | ORAL_TABLET | Freq: Every day | ORAL | 2 refills | Status: DC | PRN

## 2020-10-25 ENCOUNTER — Other Ambulatory Visit: Payer: Self-pay | Admitting: Family Medicine

## 2020-10-25 ENCOUNTER — Ambulatory Visit (INDEPENDENT_AMBULATORY_CARE_PROVIDER_SITE_OTHER): Payer: PPO | Admitting: Family Medicine

## 2020-10-25 ENCOUNTER — Encounter: Payer: Self-pay | Admitting: Family Medicine

## 2020-10-25 VITALS — BP 118/68 | HR 65 | Temp 98.8°F | Wt 159.0 lb

## 2020-10-25 DIAGNOSIS — H16223 Keratoconjunctivitis sicca, not specified as Sjogren's, bilateral: Secondary | ICD-10-CM | POA: Diagnosis not present

## 2020-10-25 DIAGNOSIS — I10 Essential (primary) hypertension: Secondary | ICD-10-CM | POA: Diagnosis not present

## 2020-10-25 DIAGNOSIS — R600 Localized edema: Secondary | ICD-10-CM | POA: Diagnosis not present

## 2020-10-25 DIAGNOSIS — E039 Hypothyroidism, unspecified: Secondary | ICD-10-CM

## 2020-10-25 DIAGNOSIS — H2513 Age-related nuclear cataract, bilateral: Secondary | ICD-10-CM | POA: Diagnosis not present

## 2020-10-25 LAB — CBC WITH DIFFERENTIAL/PLATELET
Basophils Absolute: 0 10*3/uL (ref 0.0–0.1)
Basophils Relative: 0.6 % (ref 0.0–3.0)
Eosinophils Absolute: 0.2 10*3/uL (ref 0.0–0.7)
Eosinophils Relative: 3.4 % (ref 0.0–5.0)
HCT: 28.1 % — ABNORMAL LOW (ref 36.0–46.0)
Hemoglobin: 9.5 g/dL — ABNORMAL LOW (ref 12.0–15.0)
Lymphocytes Relative: 30.3 % (ref 12.0–46.0)
Lymphs Abs: 1.4 10*3/uL (ref 0.7–4.0)
MCHC: 33.8 g/dL (ref 30.0–36.0)
MCV: 87.5 fl (ref 78.0–100.0)
Monocytes Absolute: 0.3 10*3/uL (ref 0.1–1.0)
Monocytes Relative: 6.9 % (ref 3.0–12.0)
Neutro Abs: 2.7 10*3/uL (ref 1.4–7.7)
Neutrophils Relative %: 58.8 % (ref 43.0–77.0)
Platelets: 279 10*3/uL (ref 150.0–400.0)
RBC: 3.21 Mil/uL — ABNORMAL LOW (ref 3.87–5.11)
RDW: 16.1 % — ABNORMAL HIGH (ref 11.5–15.5)
WBC: 4.6 10*3/uL (ref 4.0–10.5)

## 2020-10-25 LAB — COMPREHENSIVE METABOLIC PANEL
ALT: 11 U/L (ref 0–35)
AST: 12 U/L (ref 0–37)
Albumin: 3.6 g/dL (ref 3.5–5.2)
Alkaline Phosphatase: 71 U/L (ref 39–117)
BUN: 16 mg/dL (ref 6–23)
CO2: 28 mEq/L (ref 19–32)
Calcium: 9.2 mg/dL (ref 8.4–10.5)
Chloride: 102 mEq/L (ref 96–112)
Creatinine, Ser: 1 mg/dL (ref 0.40–1.20)
GFR: 54.85 mL/min — ABNORMAL LOW (ref >60.00)
Glucose, Bld: 110 mg/dL — ABNORMAL HIGH (ref 70–99)
Potassium: 4.4 mEq/L (ref 3.5–5.1)
Sodium: 138 mEq/L (ref 135–145)
Total Bilirubin: 0.4 mg/dL (ref 0.2–1.2)
Total Protein: 6.9 g/dL (ref 6.0–8.3)

## 2020-10-25 LAB — BRAIN NATRIURETIC PEPTIDE: Pro B Natriuretic peptide (BNP): 261 pg/mL — ABNORMAL HIGH (ref 0.0–100.0)

## 2020-10-25 LAB — TSH: TSH: 1.88 u[IU]/mL (ref 0.35–4.50)

## 2020-10-25 NOTE — Progress Notes (Signed)
   Subjective:    Patient ID: Lori Bell, female    DOB: [birth year], 75 y.o.   MRN: [id number]  HPI Here with her caretaker to discuss swelling in the lower legs that started about 2 weeks ago. She has never had this before. No chest pain or SOB. No recent medication changes. They contacted her PCP, Lori Bell, yesterday and she sent in a rx for Lasix 20 mg, but they were not aware of this until this morning. Her caretaker has been having her wear TED stockings, which helped a lot, but she left them off this morning. Apparently the swelling is usually much worse than what we see today.    Review of Systems  Constitutional: Negative.   Respiratory: Negative.   Cardiovascular: Positive for leg swelling. Negative for chest pain and palpitations.  Gastrointestinal: Negative.   Genitourinary: Negative.        Objective:   Physical Exam Constitutional:      General: She is not in acute distress. Cardiovascular:     Rate and Rhythm: Normal rate and regular rhythm.     Pulses: Normal pulses.     Heart sounds: Normal heart sounds.  Pulmonary:     Effort: Pulmonary effort is normal.     Breath sounds: Normal breath sounds. No rales.  Musculoskeletal:     Comments: Trace edema in both ankles   Lymphadenopathy:     Cervical: No cervical adenopathy.  Neurological:     Mental Status: She is alert.           Assessment & Plan:  Recent onset of LE edema. Lori Bell had placed some labs orders including a BMET and BNP, so she will have these drawn today. She will start on the Lasix. She is scheduled to follow up with Lori Bell on 11-02-20.  Gershon Crane, MD

## 2020-10-26 MED ORDER — PROPRANOLOL HCL ER 80 MG PO CP24: 80.0000 mg | ORAL_CAPSULE | Freq: Every day | ORAL | 0 refills | Status: DC

## 2020-10-31 ENCOUNTER — Ambulatory Visit: Payer: PPO | Admitting: Family Medicine

## 2020-11-01 ENCOUNTER — Other Ambulatory Visit: Payer: Self-pay

## 2020-11-02 ENCOUNTER — Encounter: Payer: Self-pay | Admitting: Family Medicine

## 2020-11-02 ENCOUNTER — Ambulatory Visit (INDEPENDENT_AMBULATORY_CARE_PROVIDER_SITE_OTHER): Payer: PPO | Admitting: Family Medicine

## 2020-11-02 VITALS — BP 120/58 | HR 65 | Temp 98.1°F | Ht 65.0 in | Wt 154.1 lb

## 2020-11-02 DIAGNOSIS — I10 Essential (primary) hypertension: Secondary | ICD-10-CM

## 2020-11-02 DIAGNOSIS — M810 Age-related osteoporosis without current pathological fracture: Secondary | ICD-10-CM | POA: Diagnosis not present

## 2020-11-02 DIAGNOSIS — R609 Edema, unspecified: Secondary | ICD-10-CM

## 2020-11-02 DIAGNOSIS — E039 Hypothyroidism, unspecified: Secondary | ICD-10-CM

## 2020-11-02 DIAGNOSIS — E538 Deficiency of other specified B group vitamins: Secondary | ICD-10-CM | POA: Diagnosis not present

## 2020-11-02 DIAGNOSIS — F0280 Dementia in other diseases classified elsewhere without behavioral disturbance: Secondary | ICD-10-CM

## 2020-11-02 DIAGNOSIS — R7989 Other specified abnormal findings of blood chemistry: Secondary | ICD-10-CM

## 2020-11-02 DIAGNOSIS — F419 Anxiety disorder, unspecified: Secondary | ICD-10-CM

## 2020-11-02 DIAGNOSIS — D649 Anemia, unspecified: Secondary | ICD-10-CM | POA: Diagnosis not present

## 2020-11-02 DIAGNOSIS — G309 Alzheimer's disease, unspecified: Secondary | ICD-10-CM

## 2020-11-02 DIAGNOSIS — E782 Mixed hyperlipidemia: Secondary | ICD-10-CM

## 2020-11-02 DIAGNOSIS — F32A Depression, unspecified: Secondary | ICD-10-CM | POA: Diagnosis not present

## 2020-11-02 DIAGNOSIS — Z0000 Encounter for general adult medical examination without abnormal findings: Secondary | ICD-10-CM

## 2020-11-02 LAB — IBC PANEL
Iron: 45 ug/dL (ref 42–145)
Saturation Ratios: 10.3 % — ABNORMAL LOW (ref 20.0–50.0)
Transferrin: 312 mg/dL (ref 212.0–360.0)

## 2020-11-02 LAB — FOLATE: Folate: 7.8 ng/mL (ref >5.9)

## 2020-11-02 LAB — BRAIN NATRIURETIC PEPTIDE: Pro B Natriuretic peptide (BNP): 121 pg/mL — ABNORMAL HIGH (ref 0.0–100.0)

## 2020-11-02 LAB — VITAMIN B12: Vitamin B-12: 127 pg/mL — ABNORMAL LOW (ref 211–911)

## 2020-11-02 LAB — FERRITIN: Ferritin: 13 ng/mL (ref 10.0–291.0)

## 2020-11-02 MED ORDER — FUROSEMIDE 20 MG PO TABS: 20.0000 mg | ORAL_TABLET | Freq: Every day | ORAL | 1 refills | Status: DC | PRN

## 2020-11-02 NOTE — Progress Notes (Signed)
Lori Bell DOB: 15-Dec-1945 Encounter date: 11/02/2020  This is a 75 y.o. female who presents for complete physical   History of present illness/Additional concerns: Osteoporosis: Fosamax 70 mg weekly  Hypertension: Irbesartan-hydrochlorothiazide 150-12.5, aspirin 81 mg, propranolol 80 mg daily.  Recently daughter called in stating that patient was having some lower extremity edema.  Lasix 20 mg was sent in to help with his short-term. Has lost 5lbs with this and swelling went away. Also had some shortness of breath per daughter when this occurred.   Dementia with agitation and psychotic features: sundays seems to be a rougher day. Not sure why. Other days doing well. Using xanax not very often; but going to try on sat night and Sunday morning. CBD oil gummies seems to help calm on other days. Can be combative. Sleeping ok generally.   Hyperlipidemia: Pravastatin 40 mg daily GERD: Protonix 40 mg daily  Appetite is ok generally; a little less this week. Sometimes when eating complains of chest pain - but they think hiatal hernia. They did swap protonix to evening; didn't help.   They never heard from home health services. No call and no visit.   Past Medical History:  Diagnosis Date  . Acute (reversible) ischemia of large intestine, extent unspecified (HCC)   . Acute ischemic colitis (HCC)   . Anxiety disorder, unspecified   . ASCVD (arteriosclerotic cardiovascular disease)   . Depression   . Essential (primary) hypertension   . Gastrointestinal hemorrhage, unspecified   . GERD (gastroesophageal reflux disease)   . GI bleeding 06/10/2017  . Hyperlipidemia   . Hypertension   . Hypothyroidism   . Hypothyroidism, unspecified   . Major depressive disorder, single episode, unspecified   . Mixed hyperlipidemia   . Unspecified dementia without behavioral disturbance (HCC)   . Upper abdominal pain, unspecified    Past Surgical History:  Procedure Laterality Date  . BIOPSY  06/11/2017    Procedure: BIOPSY;  Surgeon: West Bali, MD;  [facility]: [facility];  Service: Endoscopy;;  colon  . BREAST BIOPSY  1997   Left  . COLONOSCOPY  2003   Dr. Ewing Schlein: internal and external hemorrhoids, a few tin hyperplastic-appearing polyps not biopsed  . COLONOSCOPY WITH PROPOFOL N/A 06/11/2017   Procedure: COLONOSCOPY WITH PROPOFOL;  Surgeon: West Bali, MD;  Location: [facility];  Service: Endoscopy;  Laterality: N/A;  . INNER EAR SURGERY  1966   Right  . TOTAL ABDOMINAL HYSTERECTOMY W/ BILATERAL SALPINGOOPHORECTOMY  05/2009  . TUBAL LIGATION  1970   Allergies  Allergen Reactions  . Percocet [Oxycodone-Acetaminophen] Hives and Swelling  . Seroquel [Quetiapine Fumarate]     Neck pain  . Simvastatin    Current Meds  Medication Sig  . alendronate (FOSAMAX) 70 MG tablet Take 1 tablet (70 mg total) by mouth once a week. Take with a full glass of water on an empty stomach.  . ALPRAZolam (XANAX) 0.25 MG tablet TAKE 1 TABLET BY MOUTH ONCE DAILY AS NEEDED FOR ANXIETY.  Marland Kitchen aspirin 81 MG tablet Take 81 mg by mouth at bedtime.  . benzonatate (TESSALON PERLES) 100 MG capsule Take 1 capsule (100 mg total) by mouth 3 (three) times daily as needed.  Tery Sanfilippo Calcium (STOOL SOFTENER PO) Take by mouth in the morning and at bedtime.  . DULoxetine (CYMBALTA) 60 MG capsule TAKE ONE CAPSULE BY MOUTH ONCE DAILY.  Marland Kitchen irbesartan-hydrochlorothiazide (AVALIDE) 150-12.5 MG tablet TAKE ONE TABLET BY MOUTH ONCE DAILY.  Marland Kitchen levothyroxine (SYNTHROID) 25  MCG tablet TAKE ONE TABLET BY MOUTH ONCE DAILY BEFORE BREAKFAST.  . Melatonin 10 MG CAPS Take 10 mg by mouth at bedtime as needed.  . nitroGLYCERIN (NITROSTAT) 0.4 MG SL tablet Place 1 tablet (0.4 mg total) under the tongue every 5 (five) minutes as needed for chest pain.  Marland Kitchen OLANZapine (ZYPREXA) 2.5 MG tablet Take 1 tablet (2.5 mg total) by mouth at bedtime.  . pantoprazole (PROTONIX) 40 MG tablet TAKE ONE TABLET BY MOUTH ONCE DAILY.  . pravastatin  (PRAVACHOL) 40 MG tablet TAKE ONE TABLET BY MOUTH ONCE DAILY.  Marland Kitchen propranolol ER (INDERAL LA) 80 MG 24 hr capsule Take 1 capsule (80 mg total) by mouth at bedtime. TAKE (1) CAPSULE BY MOUTH AT BEDTIME.  . [DISCONTINUED] furosemide (LASIX) 20 MG tablet Take 1 tablet (20 mg total) by mouth daily as needed for edema.   Social History   Tobacco Use  . Smoking status: Never Smoker  . Smokeless tobacco: Never Used  Substance Use Topics  . Alcohol use: No    Alcohol/week: 0.0 standard drinks   Family History  Problem Relation Age of Onset  . Parkinsonism Father   . Coronary artery disease Other   . Cancer Brother   . Colon cancer Brother 30       deceased   . Dementia Brother   . Dementia Brother   . Dementia Sister      Review of Systems  Constitutional: Negative for chills, fatigue and fever.  Respiratory: Negative for cough, chest tightness, shortness of breath and wheezing.   Cardiovascular: Negative for chest pain (with eating; resolves with burping), palpitations and leg swelling.  Gastrointestinal: Negative for diarrhea and nausea.  Psychiatric/Behavioral: Positive for agitation, behavioral problems (intermittent), confusion, hallucinations (intermittent) and sleep disturbance. The patient is nervous/anxious (sometimes).     CBC:  Lab Results  Component Value Date   WBC 4.6 10/25/2020   HGB 9.5 (L) 10/25/2020   HCT 28.1 (L) 10/25/2020   MCH 30.0 07/02/2020   MCHC 33.8 10/25/2020   RDW 16.1 (H) 10/25/2020   PLT 279.0 10/25/2020   MPV 11.0 05/26/2020   CMP: Lab Results  Component Value Date   NA 138 10/25/2020   NA 137 02/18/2019   K 4.4 10/25/2020   CL 102 10/25/2020   CO2 28 10/25/2020   ANIONGAP 11 07/02/2020   GLUCOSE 110 (H) 10/25/2020   BUN 16 10/25/2020   BUN 14 02/18/2019   CREATININE 1.00 10/25/2020   CREATININE 1.14 (H) 05/26/2020   GFRAA 61 02/18/2019   CALCIUM 9.2 10/25/2020   PROT 6.9 10/25/2020   BILITOT 0.4 10/25/2020   ALKPHOS 71 10/25/2020    ALT 11 10/25/2020   AST 12 10/25/2020   LIPID: Lab Results  Component Value Date   CHOL 189 05/26/2020   TRIG 120 05/26/2020   HDL 55 05/26/2020   LDLCALC 111 (H) 05/26/2020    Objective:  BP (!) 120/58 (BP Location: Left Arm, Patient Position: Sitting, Cuff Size: Normal)   Pulse 65   Temp 98.1 F (36.7 C) (Oral)   Ht 5\' 5"  (1.651 m)   Wt 154 lb 1.6 oz (69.9 kg)   SpO2 99%   BMI 25.64 kg/m   Weight: 154 lb 1.6 oz (69.9 kg)   BP Readings from Last 3 Encounters:  11/02/20 (!) 120/58  10/25/20 118/68  10/08/20 (!) 124/59   Wt Readings from Last 3 Encounters:  11/02/20 154 lb 1.6 oz (69.9 kg)  10/25/20 159 lb (72.1 kg)  08/17/20 154 lb 11.2 oz (70.2 kg)    Physical Exam Constitutional:      General: She is not in acute distress.    Appearance: She is well-developed.  Cardiovascular:     Rate and Rhythm: Normal rate and regular rhythm.     Heart sounds: Murmur heard.   Systolic murmur is present with a grade of 2/6. No friction rub.  Pulmonary:     Effort: Pulmonary effort is normal. No respiratory distress.     Breath sounds: Examination of the right-lower field reveals rales. Rales (very minimal; suspect atelectasis) present. No decreased breath sounds or wheezing.  Musculoskeletal:     Right lower leg: No edema.     Left lower leg: No edema.  Neurological:     Mental Status: She is alert and oriented to person, place, and time.  Psychiatric:        Behavior: Behavior normal.     Assessment/Plan: Health Maintenance Due  Topic Date Due  . PNA vac Low Risk Adult (2 of 2 - PCV13) 06/06/2017   Health Maintenance reviewed.  1. Preventative health care We are limiting some preventative care services due to progression of dementia and trying to limit any unnecessary interventional procedures. Daughter, Lori Bell, who is health care POA is in agreement with this.  2. Dementia due to Alzheimer's disease [facility](HCC) Daughter, Lori Bell, is living with her nearly full time  and patient's other daughters are there on days that [name] works. Someone is with her around the clock now. She would benefit from home health services, but we have had difficulties finding her services except Xarelto to manage insurance.  Unfortunately, daughter has not heard from anyone, although initial referral for this incident back in December.  I know she is registered with palliative care, they are not providing any home health services.  She is not a candidate for hospice due to her currently good physical functional status.  Will work on finding out issues with getting home health for her.  3. Essential (primary) hypertension Blood pressures been well controlled.  Continue irbesartan-hydrochlorothiazide 150-12.5, propranolol 80 mg.  We will use Lasix 20 mg daily as needed to help with edema.  We discussed slight BNP elevation and concern for heart failure, but after discussion, I do not feel that further evaluation would be helpful at this point.  She is ready on a beta-blocker as well as ARB.  She responded very well to the Lasix.  4. Hypothyroidism, unspecified type Continue with Synthroid 25 mcg daily.  5. Mixed hyperlipidemia Milligrams daily.  6. Osteoporosis, unspecified osteoporosis type, unspecified pathological fracture presence Continue Fosamax 70 mg weekly.  7. Anxiety disorder, unspecified type Continue with Cymbalta 60 mg daily.  Continue with Zyprexa 2.5 mg -Xanax is kept on hand for moments of agitation.  They do use sparingly and understand risks of increased confusion with this medication.  8. Depression, unspecified depression type Mood has been stable, although she does have moments of confusion/agitation.  Continue Cymbalta 60 mg daily.  9. Anemia, unspecified type - Ferritin; Future - Ferritin - IBC Panel([facility]); Future - IBC Panel([facility])  10. B12 deficiency - Vitamin B12; Future - Folate; Future - Vitamin B12 - Folate  11. Edema, unspecified  type See above. - furosemide (LASIX) 20 MG tablet; Take 1 tablet (20 mg total) by mouth daily as needed for edema.  Dispense: 90 tablet; Refill: 1  12. Elevated brain natriuretic peptide (BNP) level See above. - Brain natriuretic peptide; Future - Brain  natriuretic peptide  Return in about 3 months (around 02/02/2021) for Chronic condition visit.  Theodis Shove, MD

## 2020-11-05 ENCOUNTER — Encounter: Payer: Self-pay | Admitting: Family Medicine

## 2020-11-05 DIAGNOSIS — G309 Alzheimer's disease, unspecified: Secondary | ICD-10-CM

## 2020-11-05 DIAGNOSIS — M6281 Muscle weakness (generalized): Secondary | ICD-10-CM

## 2020-11-05 DIAGNOSIS — F0280 Dementia in other diseases classified elsewhere without behavioral disturbance: Secondary | ICD-10-CM

## 2020-11-05 DIAGNOSIS — F419 Anxiety disorder, unspecified: Secondary | ICD-10-CM

## 2020-11-10 NOTE — Telephone Encounter (Signed)
    [place] Fus! I'm sorry I forgot to message you back. I have accepted this referral and we will get patient seen asap. We still have spots open on Friday so we can fit her in there. Let me know if you have any questions, [phone].

## 2020-11-10 NOTE — Telephone Encounter (Signed)
Dr. Hassan Rowan  My apologies, I misspoke when I said we could accept this patient. We currently do not have staff in [place] so are unable to provide services there. I do not know of anyone else who accept [facility] that I could send this to either. We currently have plenty of staff in [facility] if anything else arises. Ilene Qua [phone]

## 2020-11-13 MED ORDER — FAMOTIDINE 20 MG PO TABS: 20.0000 mg | ORAL_TABLET | Freq: Two times a day (BID) | ORAL | 2 refills | Status: DC

## 2020-11-13 NOTE — Telephone Encounter (Signed)
Lori Bell - could you please call [facility] on Monday morning and see what they say? It sounds like we are being told that there are no options for home health for her, which I just can't believe. Her daughter needs to be kept up on all of this too. I did put in a [facility] referral to see if someone in that arena could help as well. Please let me know what you find out. I wanted to call Friday but [facility] hours are M-F and by the time I saw this message they were shut down for weekend.

## 2020-11-15 ENCOUNTER — Other Ambulatory Visit: Payer: Self-pay

## 2020-11-15 ENCOUNTER — Other Ambulatory Visit: Payer: Self-pay | Admitting: Nurse Practitioner

## 2020-11-15 DIAGNOSIS — G309 Alzheimer's disease, unspecified: Secondary | ICD-10-CM | POA: Diagnosis not present

## 2020-11-15 DIAGNOSIS — F0281 Dementia in other diseases classified elsewhere with behavioral disturbance: Secondary | ICD-10-CM

## 2020-11-15 DIAGNOSIS — Z515 Encounter for palliative care: Secondary | ICD-10-CM | POA: Diagnosis not present

## 2020-11-15 NOTE — Progress Notes (Signed)
[facility] Consult Note Telephone: [phone]  Fax: [phone]  PATIENT NAME: Lori Bell [id number] [phone] (home)  DOB: [birth year] MRN: [id number]  PRIMARY CARE PROVIDER:    Caren Macadam, MD,  [place] [address] [phone]  REFERRING PROVIDER:   Caren Macadam, [place],  [place] [address] [phone]  RESPONSIBLE PARTY:   Extended Emergency Contact Information Primary Emergency Contact: Lori Bell Address: [address]          [place], [place] [address] Johnnette Litter of [place] Mobile Phone: [phone] Relation: Daughter Secondary Emergency Contact: Lori Bell [place] of [place] Phone: [phone] Relation: Daughter  I met face to face with patient in home. Daughter Lori Bell present at visit.   ASSESSMENT AND RECOMMENDATIONS:   Advance Care Planning: ACP discussion held with daughter Lori Bell, patient unable to substantially participate due to poor cognition related to advance dementia. Goal of care: Goal of care is comfort Directives:  Signed DNR form in home, copy on [place] EMR. MOST form completed today with daughter Lori Bell who has her HCPOA. Details of MOST form include limited additional intervention, antibiotics if indicated, IV fluids for a defined trial period, no feeding tube.  Symptom Management:  Dementia: family report ongoing episodes of agitation and anxiety. Patient started on Zyprexa 2.22m daily over a month ago, family report no significant improvement in condition. Patient also on Xanax 0.28mdaily as needed. Lori Bell expressed desire to place patient in an Adult day-care program to provide patient an opportunity for social interaction and also provide respite for family. Lori Bell does not want to place patient in a residential facility. Recommendation: Recommend increasing Zyprexa to 45m745mdaily.  Will place a referral for Palliative care social worker to provide guidance for Adult daycare programs. Message with recommendation to increase Zyprexa dose sent to Dr. KobEthlyn Gallerya chat on Epic. Anemia: symptomatic, family report occasional complaint of lightheadedness. Hgb 9.5 on 10/25/2020 down from 12.0 on 07/02/2020. Iron studies completed on 11/03/2019: Iron 45, saturated ratios 10.3, Ferritin 13.0, Transferrin 321.0, Folate 7.8. No report of acute bleed. Daughter report giving patient Ferous sulfate by mouth every other day, she is concerned about Ferous sulfate causing constipation for patient, report PCP recommended Iron reach foods. Family report patient is a picky eater. Recommendation: consider admnistering Ferous sulfate 3245m72mily x 3 months. Patient already on Docusate twice a day, may add Senokot 8.6mg 43mly if constipated.   Follow up Palliative Care Visit: Palliative care will continue to follow for complex decision making and symptom management. Return in about 4 weeks or prn.  Family /Caregiver/Community Supports: Patient lives at home, her daughter Lori Smokeis her main caregiver moved in last month to live with her. Patient has 2 other daughters.   Cognitive / Functional decline: Patient pleasantly. Requires moderate assist with her ADLs, dependent daughter for bathing and dressing, able to feed self. She walks independently within her home, uses cane occasionally, gait is unstable, has frequent falls.    I spent 40 minutes providing this consultation, time includes time spent with patient/family, chart review, provider coordination, and documentation. More than 50% of the time in this consultation was spent counseling and coordinating communication.   CHIEF COMPLAINT: Agitation  [facility]. Records reviewed and summarized bellow.  HISTORY OF PRESENT ILLNESS:  Lori Bell 75 y.75  year old  female with multiple medical problems including Alzheimer dementia without behavior disturbance (FAST 5), CAD, major depression, HTN, GERD, HLD, osteoporosis, hypothyroidism. Family report ongoing decline in cognition with patient requesting to go home and episodes of agitation. Patient was started on Olanzepine 2.55m daily by ber PCP, daughter report no significant improvement in behavior. Palliative Care was asked to help address advance care planning, goals of care and with symptoms management. This is a follow up visit from 10/11/2020.   CODE STATUS: DNR  PPS: 50%  HOSPICE ELIGIBILITY/DIAGNOSIS: TBD  ROS Limited ROS due to advance dementia, pertinent ROS noted in HPI.   Physical Exam: Vital signs: BP 122/80, P 65, RR 16, 99% on room air Current and past weights:157lbs, no report of weight loss, Ht 565f", BMI  General: frail appearing, coperative EYES: anicteric sclera, lids intact, no discharge  ENMT: intact hearing, oral mucous membranes moist CV: no LE edema Pulmonary: no increased work of breathing, no cough, no audible wheezes, room air Abdomen: no ascites GU: deferred MSK: moves all extremities, ambulatory Skin: warm and dry, no rashes or wounds on visible skin Neuro: Generalized weakness, moderatecognitive impairment Psych: non-anxious affecttoday, A and O x 1 Hem/lymph/immuno: no widespread bruising  PAST MEDICAL HISTORY:  Past Medical History:  Diagnosis Date  . Acute (reversible) ischemia of large intestine, extent unspecified ([facility]  . Acute ischemic colitis ([facility]  . Anxiety disorder, unspecified   . ASCVD (arteriosclerotic cardiovascular disease)   . Depression   . Essential (primary) hypertension   . Gastrointestinal hemorrhage, unspecified   . GERD (gastroesophageal reflux disease)   . GI bleeding 06/10/2017  . Hyperlipidemia   . Hypertension   . Hypothyroidism   . Hypothyroidism, unspecified   . Major depressive disorder, single episode,  unspecified   . Mixed hyperlipidemia   . Unspecified dementia without behavioral disturbance ([facility]  . Upper abdominal pain, unspecified     SOCIAL HX:  Social History   Tobacco Use  . Smoking status: Never Smoker  . Smokeless tobacco: Never Used  Substance Use Topics  . Alcohol use: No    Alcohol/week: 0.0 standard drinks   FAMILY HX:  Family History  Problem Relation Age of Onset  . Parkinsonism Father   . Coronary artery disease Other   . Cancer Brother   . Colon cancer Brother [phone]     deceased   . Dementia Brother   . Dementia Brother   . Dementia Sister     ALLERGIES:  Allergies  Allergen Reactions  . Percocet [Oxycodone-Acetaminophen] Hives and Swelling  . Seroquel [Quetiapine Fumarate]     Neck pain  . Simvastatin      PERTINENT MEDICATIONS:  Outpatient Encounter Medications as of 11/15/2020  Medication Sig  . alendronate (FOSAMAX) 70 MG tablet Take 1 tablet (70 mg total) by mouth once a week. Take with a full glass of water on an empty stomach.  . ALPRAZolam (XANAX) 0.25 MG tablet TAKE 1 TABLET BY MOUTH ONCE DAILY AS NEEDED FOR ANXIETY.  . Marland Kitchenspirin 81 MG tablet Take 81 mg by mouth at bedtime.  . benzonatate (TESSALON PERLES) 100 MG capsule Take 1 capsule (100 mg total) by mouth 3 (three) times daily as needed.  . Mariane Baumgartenalcium (STOOL SOFTENER PO) Take by mouth in the morning and at bedtime.  . DULoxetine (CYMBALTA) 60 MG capsule TAKE ONE CAPSULE BY MOUTH ONCE DAILY.  . famotidine (PEPCID) 20 MG tablet Take 1 tablet (20 mg  total) by mouth 2 (two) times daily.  . furosemide (LASIX) 20 MG tablet Take 1 tablet (20 mg total) by mouth daily as needed for edema.  . irbesartan-hydrochlorothiazide (AVALIDE) 150-12.5 MG tablet TAKE ONE TABLET BY MOUTH ONCE DAILY.  Marland Kitchen levothyroxine (SYNTHROID) 25 MCG tablet TAKE ONE TABLET BY MOUTH ONCE DAILY BEFORE BREAKFAST.  . Melatonin 10 MG CAPS Take 10 mg by mouth at bedtime as needed.  . nitroGLYCERIN (NITROSTAT) 0.4 MG SL  tablet Place 1 tablet (0.4 mg total) under the tongue every 5 (five) minutes as needed for chest pain.  Marland Kitchen OLANZapine (ZYPREXA) 2.5 MG tablet Take 1 tablet (2.5 mg total) by mouth at bedtime.  . pantoprazole (PROTONIX) 40 MG tablet TAKE ONE TABLET BY MOUTH ONCE DAILY.  . pravastatin (PRAVACHOL) 40 MG tablet TAKE ONE TABLET BY MOUTH ONCE DAILY.  Marland Kitchen propranolol ER (INDERAL LA) 80 MG 24 hr capsule Take 1 capsule (80 mg total) by mouth at bedtime. TAKE (1) CAPSULE BY MOUTH AT BEDTIME.  . [DISCONTINUED] QUEtiapine (SEROQUEL) 25 MG tablet Take 1 tablet (25 mg total) by mouth at bedtime.   No facility-administered encounter medications on file as of 11/15/2020.    Thank you for the opportunity to participate in the care of Ms. Danyal Carlisi. The palliative care team will continue to follow. Please call our office at [phone] if we can be of additional assistance.  Jari Favre, DNP, AGPCNP-BC

## 2020-11-16 ENCOUNTER — Telehealth: Payer: Self-pay

## 2020-11-16 NOTE — Telephone Encounter (Signed)
(  4:02p) Per request of NP-Queeneth, SW completed call to patient's daughter-Lori Bell and provided resources for adult day programs in their area of [place]. SW provided two programs: [facility] program and [employer]. Lori Bell expressed appreciation for the call and resources. SW encouraged her to call with any additional questions/concerns.   *NP updated for follow-up.

## 2020-11-24 ENCOUNTER — Other Ambulatory Visit: Payer: Self-pay | Admitting: Family Medicine

## 2020-11-24 MED ORDER — OLANZAPINE 5 MG PO TABS: 5.0000 mg | ORAL_TABLET | Freq: Every day | ORAL | 1 refills | Status: DC

## 2020-11-24 MED ORDER — OLANZAPINE 5 MG PO TABS: 2.5000 mg | ORAL_TABLET | Freq: Every day | ORAL | 1 refills | Status: DC

## 2020-11-28 MED ORDER — ALPRAZOLAM 0.5 MG PO TABS: 0.5000 mg | ORAL_TABLET | Freq: Every day | ORAL | 2 refills | Status: DC | PRN

## 2020-11-29 ENCOUNTER — Encounter: Payer: Self-pay | Admitting: Family Medicine

## 2020-12-07 ENCOUNTER — Encounter: Payer: Self-pay | Admitting: Family Medicine

## 2020-12-20 ENCOUNTER — Telehealth: Payer: PPO | Admitting: Physician Assistant

## 2020-12-20 DIAGNOSIS — J019 Acute sinusitis, unspecified: Secondary | ICD-10-CM

## 2020-12-20 MED ORDER — FLUTICASONE PROPIONATE 50 MCG/ACT NA SUSP: 2.0000 | Freq: Every day | NASAL | 0 refills | Status: DC

## 2020-12-20 MED ORDER — AMOXICILLIN-POT CLAVULANATE 875-125 MG PO TABS: 1.0000 | ORAL_TABLET | Freq: Two times a day (BID) | ORAL | 0 refills | Status: DC

## 2020-12-20 NOTE — Progress Notes (Signed)
We are sorry that you are not feeling well.  Here is how we plan to help!  Based on what you have shared with me it looks like you have sinusitis.  Sinusitis is inflammation and infection in the sinus cavities of the head.  Based on your presentation I believe you most likely have Acute Bacterial Sinusitis.  This is an infection caused by bacteria and is treated with antibiotics. I have prescribed Augmentin 875mg /125mg  one tablet twice daily with food, for 7 days.  I will also prescribe flonase.  You may use an oral decongestant such as Mucinex D or if you have glaucoma or high blood pressure use plain Mucinex. Saline nasal spray help and can safely be used as often as needed for congestion.  If you develop worsening sinus pain, fever or notice severe headache and vision changes, or if symptoms are not better after completion of antibiotic, please schedule an appointment with a health care provider.    Sinus infections are not as easily transmitted as other respiratory infection, however we still recommend that you avoid close contact with loved ones, especially the very young and elderly.  Remember to wash your hands thoroughly throughout the day as this is the number one way to prevent the spread of infection!  Home Care:  Only take medications as instructed by your medical team.  Complete the entire course of an antibiotic.  Do not take these medications with alcohol.  A steam or ultrasonic humidifier can help congestion.  You can place a towel over your head and breathe in the steam from hot water coming from a faucet.  Avoid close contacts especially the very young and the elderly.  Cover your mouth when you cough or sneeze.  Always remember to wash your hands.  Get Help Right Away If:  You develop worsening fever or sinus pain.  You develop a severe head ache or visual changes.  Your symptoms persist after you have completed your treatment plan.  Make sure you  Understand  these instructions.  Will watch your condition.  Will get help right away if you are not doing well or get worse.  Your e-visit answers were reviewed by a board certified advanced clinical practitioner to complete your personal care plan.  Depending on the condition, your plan could have included both over the counter or prescription medications.  If there is a problem please reply  once you have received a response from your provider.  Your safety is important to .  If you have drug allergies check your prescription carefully.    You can use MyChart to ask questions about today's visit, request a non-urgent call back, or ask for a work or school excuse for 24 hours related to this e-Visit. If it has been greater than 24 hours you will need to follow up with your provider, or enter a new e-Visit to address those concerns.  You will get an e-mail in the next two days asking about your experience.  I hope that your e-visit has been valuable and will speed your recovery. Thank you for using e-visits.   Greater than 5 minutes, yet less than 10 minutes of time have been spent researching, coordinating, and implementing care for this patient today

## 2020-12-27 ENCOUNTER — Other Ambulatory Visit: Payer: PPO | Admitting: Nurse Practitioner

## 2020-12-27 ENCOUNTER — Telehealth: Payer: Self-pay | Admitting: Family Medicine

## 2020-12-27 ENCOUNTER — Other Ambulatory Visit: Payer: Self-pay

## 2020-12-27 DIAGNOSIS — Z515 Encounter for palliative care: Secondary | ICD-10-CM | POA: Diagnosis not present

## 2020-12-27 DIAGNOSIS — F0281 Dementia in other diseases classified elsewhere with behavioral disturbance: Secondary | ICD-10-CM | POA: Diagnosis not present

## 2020-12-27 DIAGNOSIS — G309 Alzheimer's disease, unspecified: Secondary | ICD-10-CM | POA: Diagnosis not present

## 2020-12-27 NOTE — Telephone Encounter (Signed)
Karlton Lemon NP is calling and wanted to let the provider know that patient is having increased aggression, agitation and hallucination. Provider stated that patient was taking Zyprexa but doesn't seem to help and wanted to see if  patient can try Risperbal, please advise. CB is [phone]

## 2020-12-27 NOTE — Progress Notes (Signed)
[facility] Consult Note Telephone: [phone]  Fax: [phone]    Date of encounter: 12/27/20 PATIENT NAME: Lori Bell [place] [id number]   [phone] (home)  DOB: [birth year] MRN: [id number]  PRIMARY CARE PROVIDER:    Caren Macadam, MD,  [place] [address] [phone]  REFERRING PROVIDER:   Caren Macadam, MD [place],  [place] [address] [phone]  RESPONSIBLE PARTY:    Contact Information    Name Relation Home Work [place] Daughter   [phone]   [place], [place] Daughter [phone]     Gabriela Eves Daughter [phone]       I met face to face with patient and daughter Lori Bell in home. Palliative Care was asked to follow this patient by consultation request of  Caren Macadam, MD to address advance care planning and complex medical decision making. This is a follow up visit.                                 ASSESSMENT AND PLAN / RECOMMENDATIONS:   Advance Care Planning/Goals of Care:  Code status: DNR Goal of care: Goal of care is comfort. Directives: Signed DNR form in home, copy on [facility] EMR. MOST form completed today with daughter Lori Bell who has her HCPOA. Details of MOST form include limited additional intervention, antibiotics if indicated, IV fluids for a defined trial period, no feeding tube.  Symptom Management/Plan: Dementia related agitation/aggression and psychosis: Patient encouraged to take PRN Xanax 0.924m during visit today. Consider stopping Zyprexa. Family report patient had intolerance to Seroquel in the past. Consider a trial of Risperdal 0.219mby mouth two times a day, may increase dose based on response and tolerability. Goal of care is comfort. May consider referral to a Neuropsychologist if no improvement with Risperdal. Provided general support and encouragement, no other unmet needs  identified.   Follow up Palliative Care Visit: Palliative care will continue to follow for complex medical decision making, advance care planning, and clarification of goals. Follow up phone cal in two weeks to evaluate patient's response to therapy.   PPS: 50%  HOSPICE ELIGIBILITY/DIAGNOSIS: TBD  CHIEF COMPLAIN: Worsening agitation and Hallucination  History obtained from review of Epic EMR, and discussion with Lori Bell her daughter.   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  Daughter Lori Bell who lives patient report worsening visual and auditory hallucination in the context of dementia. Condition is associated with agitation and occasional aggression. Patient dose of Zyprexa was increased from 2.24m51mo 24mg77m the last month without improvement in symptoms. Patient on Xanax 0.224mg724mly as needed for anxiety. During visit today, patient was agitated with both visual and auditory hallucination. She was seeing her husband and mother, she report having a baby who was crying in the house and was significantly upset by it.  She had verbal exchange with her daughter Lori Smokesing Lori Bell her medications. Patient just completed a 5 days course of Augmentin for a sinus infection. No report of fever, chills, SOB, dysuria, malodorous urine, or abdominal pain. Report occasional chest pain relieved by Nitroglycerin. Ten systems reviewed and are negative for acute change, except as noted in the HPI.   Component Ref  Range & Units 2 mo ago  (10/25/20) 5 mo ago  (07/02/20) 5 mo ago  (07/01/20) 7 mo ago  (05/26/20) 1 yr ago  (11/05/19) 1 yr ago  (02/18/19) 1 yr ago  (02/18/19)  Sodium 135 - 145 mEq/L 138  130Low R  128Low R  135 R  135   137 R   Potassium 3.5 - 5.1 mEq/L 4.4  3.7 R  3.4Low R  4.8 R  4.2    4.6 R   Chloride 96 - 112 mEq/L 102  95Low R  95Low R  99 R  95Low   99 R   CO2 19 - 32 mEq/L 28  24 R  23 R  25 R  33High   30Abnormal R   Glucose, Bld 70 - 99 mg/dL 110High  103High CM  117High CM  129High R, CM  95   139 R   BUN 6 - 23 mg/dL 16  20 R  19 R  23 R  12   14 R   Creatinine, Ser 0.40 - 1.20 mg/dL 1.00  1.02High R  1.09High R  1.14High R, CM  1.14   1.0 R   Total Bilirubin 0.2 - 1.2 mg/dL 0.4    0.5  0.6     Alkaline Phosphatase 39 - 117 U/L 71     86     AST 0 - 37 U/L 12    12 R  9     ALT 0 - 35 U/L 11    14 R  10     Total Protein 6.0 - 8.3 g/dL 6.9    6.8 R  7.0     Albumin 3.5 - 5.2 g/dL 3.6     3.9     GFR >60.00 mL/min 54.85Low     46.44Low     Comment: Calculated using the CKD-EPI Creatinine Equation (2021)  Calcium 8.4 - 10.5 mg/dL 9.2  9.3 R  9.1 R  9.4 R  9.6  9.7 R    Resulting Agency  [facility]     Component Ref Range & Units 2 mo ago  (10/25/20) 5 mo ago  (07/02/20) 5 mo ago  (07/01/20) 7 mo ago  (05/26/20) 1 yr ago  (11/05/19) 1 yr ago  (02/03/19) 1 yr ago  (02/03/19)  WBC 4.0 - 10.5 K/uL 4.6  4.9  6.4  4.6 R  4.5   4.8 R   RBC 3.87 - 5.11 Mil/uL 3.21Low  4.00 R  3.78Low R  3.94 R  4.11  4.48 R    Hemoglobin 12.0 - 15.0 g/dL 9.5Low  12.0  11.3Low  11.3Low R  12.7   13.3 R   HCT 36.0 - 46.0 % 28.1Low  35.0Low  33.0Low  34.7Low R  36.9   39 R   MCV 78.0 - 100.0 fl 87.5  87.5 R  87.3 R  88.1 R  89.8     MCHC 30.0 - 36.0 g/dL 33.8  34.3  34.2  32.6 R  34.4     RDW 11.5 - 15.5 % 16.1High  13.6  13.7  14.1 R  13.5     Platelets 150.0 - 400.0 K/uL 279.0  290 R  290 R  248 R  228     I reviewed available labs, medications, imaging, studies and related documents from the EMR.  Records reviewed and summarized above.   Physical Exam: General: frail  appearing,non-coperative, NAD EYES: anicteric sclera, no discharge  ENMT: intact hearing, oral mucous membranes  moist CV: no LE edema Pulmonary: no increased work of breathing, no cough, no audible wheezes, room air Abdomen: no ascites GU: deferred KMQ:KMMNO all extremities, ambulatory Skin: warm and dry, no rashes or wounds on visible skin Neuro: Generalized weakness, severecognitive impairment Psych: anxious affecttoday, agitated, A and Ox 1 Hem/lymph/immuno: no widespread bruising  Past Medical History:  Diagnosis Date  . Acute (reversible) ischemia of large intestine, extent unspecified ([facility])   . Acute ischemic colitis ([facility])   . Anxiety disorder, unspecified   . ASCVD (arteriosclerotic cardiovascular disease)   . Depression   . Essential (primary) hypertension   . Gastrointestinal hemorrhage, unspecified   . GERD (gastroesophageal reflux disease)   . GI bleeding 06/10/2017  . Hyperlipidemia   . Hypertension   . Hypothyroidism   . Hypothyroidism, unspecified   . Major depressive disorder, single episode, unspecified   . Mixed hyperlipidemia   . Unspecified dementia without behavioral disturbance ([facility])   . Upper abdominal pain, unspecified    Current Outpatient Medications on File Prior to Visit  Medication Sig Dispense Refill  . alendronate (FOSAMAX) 70 MG tablet Take 1 tablet (70 mg total) by mouth once a week. Take with a full glass of water on an empty stomach. 12 tablet 3  . ALPRAZolam (XANAX) 0.5 MG tablet Take 1 tablet (0.5 mg total) by mouth daily as needed for anxiety. 30 tablet 2  . amoxicillin-clavulanate (AUGMENTIN) 875-125 MG tablet Take 1 tablet by mouth 2 (two) times daily. One po bid x 7 days 14 tablet 0  . aspirin 81 MG tablet Take 81 mg by mouth at bedtime.    . benzonatate (TESSALON PERLES) 100 MG capsule Take 1 capsule (100 mg total) by mouth 3 (three) times daily as needed. 20 capsule 0  . Docusate Calcium (STOOL SOFTENER PO) Take by mouth in the morning and at bedtime.    . DULoxetine (CYMBALTA) 60 MG capsule TAKE ONE CAPSULE BY MOUTH ONCE DAILY. 90 capsule  0  . famotidine (PEPCID) 20 MG tablet Take 1 tablet (20 mg total) by mouth 2 (two) times daily. 60 tablet 2  . fluticasone (FLONASE) 50 MCG/ACT nasal spray Place 2 sprays into both nostrils daily. 9.9 g 0  . furosemide (LASIX) 20 MG tablet Take 1 tablet (20 mg total) by mouth daily as needed for edema. 90 tablet 1  . irbesartan-hydrochlorothiazide (AVALIDE) 150-12.5 MG tablet TAKE ONE TABLET BY MOUTH ONCE DAILY. 90 tablet 1  . levothyroxine (SYNTHROID) 25 MCG tablet TAKE ONE TABLET BY MOUTH ONCE DAILY BEFORE BREAKFAST. 90 tablet 0  . Melatonin 10 MG CAPS Take 10 mg by mouth at bedtime as needed. 30 capsule   . nitroGLYCERIN (NITROSTAT) 0.4 MG SL tablet Place 1 tablet (0.4 mg total) under the tongue every 5 (five) minutes as needed for chest pain. 30 tablet 0  . OLANZapine (ZYPREXA) 5 MG tablet Take 1 tablet (5 mg total) by mouth at bedtime. 90 tablet 1  . pantoprazole (PROTONIX) 40 MG tablet TAKE ONE TABLET BY MOUTH ONCE DAILY. 90 tablet 1  . pravastatin (PRAVACHOL) 40 MG tablet TAKE ONE TABLET BY MOUTH ONCE DAILY. 90 tablet 0  . propranolol ER (INDERAL LA) 80 MG 24 hr capsule Take 1 capsule (80 mg total) by mouth at bedtime. TAKE (1) CAPSULE BY MOUTH AT BEDTIME. 90 capsule 0  . [DISCONTINUED] QUEtiapine (SEROQUEL) 25 MG tablet Take 1 tablet (25 mg total)  by mouth at bedtime. 90 tablet 1   No current facility-administered medications on file prior to visit.   Thank you for the opportunity to participate in the care of Ms. Corigliano.  The palliative care team will continue to follow. Please call our office at [phone] if we can be of additional assistance.   Jari Favre, DNP, AGPCNP-BC  COVID-19 PATIENT SCREENING TOOL Asked and negative response unless otherwise noted:   Have you had symptoms of covid, tested positive or been in contact with someone with symptoms/positive test in the past 5-10 days?

## 2020-12-28 ENCOUNTER — Ambulatory Visit: Payer: PPO

## 2020-12-28 NOTE — Telephone Encounter (Signed)
About a month ago we increased the zyprexa. If there was no improvement with that increase, then we can try switching gears to the risperdal (let me know and I can send in starting dose). If they felt she responded at least initially to the increase in the zyprexa dose; I would suggest we have her increase the zyprexa first to continuing with 5mg  at bedtime but adding another half dose (2.5mg ) in the morning. She has had difficulty with tolerating some new meds in the past so just want to check in before switching.

## 2020-12-28 NOTE — Progress Notes (Deleted)
Subjective:   Lori Bell is a 75 y.o. female who presents for an Initial Medicare Annual Wellness Visit.  I connected with Judie Grieve  today by telephone and verified that I am speaking with the correct person using two identifiers. Location patient: home Location provider: work Persons participating in the virtual visit: patient, provider.   I discussed the limitations, risks, security and privacy concerns of performing an evaluation and management service by telephone and the availability of in person appointments. I also discussed with the patient that there may be a patient responsible charge related to this service. The patient expressed understanding and verbally consented to this telephonic visit.    Interactive audio and video telecommunications were attempted between this provider and patient, however failed, due to patient having technical difficulties OR patient did not have access to video capability.  We continued and completed visit with audio only.     Review of Systems    n/a       Objective:    There were no vitals filed for this visit. There is no height or weight on file to calculate BMI.  Advanced Directives 07/02/2020 07/01/2020 06/11/2017 06/11/2017 06/10/2017 06/09/2017  Does Patient Have a Medical Advance Directive? Yes No Yes - Yes Yes  Type of Youth worker of [place];Living will - Healthcare Power of [place];Living will Living will  Does patient want to make changes to medical advance directive? - - - No - Patient declined - -  Copy of Healthcare Power of Attorney in Chart? - - No - copy requested - No - copy requested -  Would patient like information on creating a medical advance directive? - - - - No - Patient declined -    Current Medications (verified) Outpatient Encounter Medications as of 12/28/2020  Medication Sig  . alendronate (FOSAMAX) 70 MG tablet Take 1 tablet (70 mg total) by  mouth once a week. Take with a full glass of water on an empty stomach.  . ALPRAZolam (XANAX) 0.5 MG tablet Take 1 tablet (0.5 mg total) by mouth daily as needed for anxiety.  Marland Kitchen amoxicillin-clavulanate (AUGMENTIN) 875-125 MG tablet Take 1 tablet by mouth 2 (two) times daily. One po bid x 7 days  . aspirin 81 MG tablet Take 81 mg by mouth at bedtime.  . benzonatate (TESSALON PERLES) 100 MG capsule Take 1 capsule (100 mg total) by mouth 3 (three) times daily as needed.  Tery Sanfilippo Calcium (STOOL SOFTENER PO) Take by mouth in the morning and at bedtime.  . DULoxetine (CYMBALTA) 60 MG capsule TAKE ONE CAPSULE BY MOUTH ONCE DAILY.  . famotidine (PEPCID) 20 MG tablet Take 1 tablet (20 mg total) by mouth 2 (two) times daily.  . fluticasone (FLONASE) 50 MCG/ACT nasal spray Place 2 sprays into both nostrils daily.  . furosemide (LASIX) 20 MG tablet Take 1 tablet (20 mg total) by mouth daily as needed for edema.  . irbesartan-hydrochlorothiazide (AVALIDE) 150-12.5 MG tablet TAKE ONE TABLET BY MOUTH ONCE DAILY.  Marland Kitchen levothyroxine (SYNTHROID) 25 MCG tablet TAKE ONE TABLET BY MOUTH ONCE DAILY BEFORE BREAKFAST.  . Melatonin 10 MG CAPS Take 10 mg by mouth at bedtime as needed.  . nitroGLYCERIN (NITROSTAT) 0.4 MG SL tablet Place 1 tablet (0.4 mg total) under the tongue every 5 (five) minutes as needed for chest pain.  Marland Kitchen OLANZapine (ZYPREXA) 5 MG tablet Take 1 tablet (5 mg total) by mouth at bedtime.  . pantoprazole (PROTONIX)  40 MG tablet TAKE ONE TABLET BY MOUTH ONCE DAILY.  . pravastatin (PRAVACHOL) 40 MG tablet TAKE ONE TABLET BY MOUTH ONCE DAILY.  Marland Kitchen. propranolol ER (INDERAL LA) 80 MG 24 hr capsule Take 1 capsule (80 mg total) by mouth at bedtime. TAKE (1) CAPSULE BY MOUTH AT BEDTIME.  . [DISCONTINUED] QUEtiapine (SEROQUEL) 25 MG tablet Take 1 tablet (25 mg total) by mouth at bedtime.   No facility-administered encounter medications on file as of 12/28/2020.    Allergies (verified) Percocet  [oxycodone-acetaminophen], Seroquel [quetiapine fumarate], and Simvastatin   History: Past Medical History:  Diagnosis Date  . Acute (reversible) ischemia of large intestine, extent unspecified ([facility])   . Acute ischemic colitis ([facility])   . Anxiety disorder, unspecified   . ASCVD (arteriosclerotic cardiovascular disease)   . Depression   . Essential (primary) hypertension   . Gastrointestinal hemorrhage, unspecified   . GERD (gastroesophageal reflux disease)   . GI bleeding 06/10/2017  . Hyperlipidemia   . Hypertension   . Hypothyroidism   . Hypothyroidism, unspecified   . Major depressive disorder, single episode, unspecified   . Mixed hyperlipidemia   . Unspecified dementia without behavioral disturbance ([facility])   . Upper abdominal pain, unspecified    Past Surgical History:  Procedure Laterality Date  . BIOPSY  06/11/2017   Procedure: BIOPSY;  Surgeon: West BaliFields, Sandi L, MD;  Location: [facility];  Service: Endoscopy;;  colon  . BREAST BIOPSY  1997   Left  . COLONOSCOPY  2003   Dr. Ewing SchleinMagod: internal and external hemorrhoids, a few tin hyperplastic-appearing polyps not biopsed  . COLONOSCOPY WITH PROPOFOL N/A 06/11/2017   Procedure: COLONOSCOPY WITH PROPOFOL;  Surgeon: West BaliFields, Sandi L, MD;  Location: [facility];  Service: Endoscopy;  Laterality: N/A;  . INNER EAR SURGERY  1966   Right  . TOTAL ABDOMINAL HYSTERECTOMY W/ BILATERAL SALPINGOOPHORECTOMY  05/2009  . TUBAL LIGATION  1970   Family History  Problem Relation Age of Onset  . Parkinsonism Father   . Coronary artery disease Other   . Cancer Brother   . Colon cancer Brother [id number]       deceased   . Dementia Brother   . Dementia Brother   . Dementia Sister    Social History   Socioeconomic History  . Marital status: Widowed    Spouse name: Not on file  . Number of children: 4  . Years of education: 9  . Highest education level: Not on file  Occupational History  . Occupation: Retired     Comment: Delivering  medications to pharmacies.   Tobacco Use  . Smoking status: Never Smoker  . Smokeless tobacco: Never Used  Vaping Use  . Vaping Use: Never used  Substance and Sexual Activity  . Alcohol use: No    Alcohol/week: 0.0 standard drinks  . Drug use: No  . Sexual activity: Never  Other Topics Concern  . Not on file  Social History Narrative   Lives at home by self.   Caffeine use: 1 cup coffee/day    Exercises regularly   Social Determinants of Health   Financial Resource Strain: Not on file  Food Insecurity: Not on file  Transportation Needs: Not on file  Physical Activity: Not on file  Stress: Not on file  Social Connections: Not on file    Tobacco Counseling Counseling given: Not Answered   Clinical Intake:                 Diabetic?no  Activities of Daily Living No flowsheet data found.  Patient Care Team: Wynn Banker, MD as PCP - General (Family Medicine)  Indicate any recent Medical Services you may have received from other than [facility] providers in the past year (date may be approximate).     Assessment:   This is a routine wellness examination for Lori Bell.  Hearing/Vision screen No exam data present  Dietary issues and exercise activities discussed:    Goals Addressed   None    Depression Screen PHQ 2/9 Scores 11/02/2020  PHQ - 2 Score 1    Fall Risk Fall Risk  11/02/2020  Falls in the past year? 1  Number falls in past yr: 1  Injury with Fall? 0    FALL RISK PREVENTION PERTAINING TO THE HOME:  Any stairs in or around the home? {YES/NO:21197} If so, are there any without handrails? No  Home free of loose throw rugs in walkways, pet beds, electrical cords, etc? Yes  Adequate lighting in your home to reduce risk of falls? Yes   ASSISTIVE DEVICES UTILIZED TO PREVENT FALLS:  Life alert? No  Use of a cane, walker or w/c? {YES/NO:21197} Grab bars in the bathroom? {YES/NO:21197} Shower chair or bench in shower?  {YES/NO:21197} Elevated toilet seat or a handicapped toilet? {YES/NO:21197}    Cognitive Function:     Normal cognitive status assessed by direct observation by this Nurse Health Advisor. No abnormalities found.      Immunizations Immunization History  Administered Date(s) Administered  . Fluad Quad(high Dose 65+) 05/02/2020  . Influenza,inj,Quad PF,6+ Mos 06/11/2019  . Influenza-Unspecified 08/01/2011, 07/09/2012, 05/12/2013, 06/07/2014, 05/30/2015, 06/06/2016  . Pneumococcal Polysaccharide-23 06/06/2016  . Tdap 08/20/2013  . Zoster 05/30/2015    {TDAP status:2101805}  {Flu Vaccine status:2101806}  {Pneumococcal vaccine status:2101807}  {Covid-19 vaccine status:2101808}  Qualifies for Shingles Vaccine? {YES/NO:21197}  Zostavax completed {YES/NO:21197}  {Shingrix Completed?:2101804}  Screening Tests Health Maintenance  Topic Date Due  . COVID-19 Vaccine (1) Never done  . PNA vac Low Risk Adult (2 of 2 - PCV13) 06/06/2017  . Hepatitis C Screening  05/02/2021 (Originally 08/24/1963)  . INFLUENZA VACCINE  03/20/2021  . TETANUS/TDAP  08/21/2023  . DEXA SCAN  Completed  . HPV VACCINES  Aged Out    Health Maintenance  Health Maintenance Due  Topic Date Due  . COVID-19 Vaccine (1) Never done  . PNA vac Low Risk Adult (2 of 2 - PCV13) 06/06/2017    {Colorectal cancer screening:2101809}  {Mammogram status:21018020}  {Bone Density status:21018021}  Lung Cancer Screening: (Low Dose CT Chest recommended if Age 9-80 years, 30 pack-year currently smoking OR have quit w/in 15years.) {DOES NOT does:27190::"does not"} qualify.   Lung Cancer Screening Referral: ***  Additional Screening:  Hepatitis C Screening: {DOES NOT does:27190::"does not"} qualify; Completed ***  Vision Screening: Recommended annual ophthalmology exams for early detection of glaucoma and other disorders of the eye. Is the patient up to date with their annual eye exam?  {YES/NO:21197} Who is the  provider or what is the name of the office in which the patient attends annual eye exams? *** If pt is not established with a provider, would they like to be referred to a provider to establish care? {YES/NO:21197}.   Dental Screening: Recommended annual dental exams for proper oral hygiene  Community Resource Referral / Chronic Care Management: CRR required this visit?  {YES/NO:21197}  CCM required this visit?  {YES/NO:21197}     Plan:     I have personally reviewed and  noted the following in the patient's chart:   . Medical and social history . Use of alcohol, tobacco or illicit drugs  . Current medications and supplements including opioid prescriptions. {Opioid Prescriptions:[phone]} . Functional ability and status . Nutritional status . Physical activity . Advanced directives . List of other physicians . Hospitalizations, surgeries, and ER visits in previous 12 months . Vitals . Screenings to include cognitive, depression, and falls . Referrals and appointments  In addition, I have reviewed and discussed with patient certain preventive protocols, quality metrics, and best practice recommendations. A written personalized care plan for preventive services as well as general preventive health recommendations were provided to patient.     March Rummage, LPN   8/67/6720   Nurse Notes: ***

## 2020-12-28 NOTE — Telephone Encounter (Signed)
Spoke with the nurse practitioner and informed her of the message below.  She stated she spoke with the patients daughter and she did not feel there was a difference in the patient with taking the 5mg  dose.  Message sent to PCP.

## 2020-12-30 ENCOUNTER — Other Ambulatory Visit: Payer: Self-pay | Admitting: Family Medicine

## 2020-12-30 MED ORDER — RISPERIDONE 0.25 MG PO TABS: ORAL_TABLET | ORAL | 1 refills | Status: DC

## 2020-12-30 MED ORDER — ALPRAZOLAM 0.5 MG PO TABS: 0.5000 mg | ORAL_TABLET | Freq: Two times a day (BID) | ORAL | 2 refills | Status: DC | PRN

## 2020-12-30 NOTE — Telephone Encounter (Signed)
Talked with daughter. We are going to start risperdal. Flonnie is having much more agitation and hallucinations; hard to calm down although [name] is very good with working on redirecting and talking her through agitation. I am going to give more xanax for use if needed with agitation. She will continue to use sparingly.   [name] prefers to hold off on additional referrals at this time - hard to get mom in for visits due to agitation/confusion; but if we are not finding good balance with medication we did discuss neuropsych referral.

## 2020-12-30 NOTE — Telephone Encounter (Signed)
Patient's daughter called back and was informed of the message below.  [name] agreed to the change below and an appt was scheduled for 6/15.  [name] stated she stays with the patient 5 days a week, sister and son relieves her the other days, she is concerned with the patients lip "quivering" for the past few times she has been with her. Stated hallucinations are worse, states she hears babies crying and states she fears someone murdering her and no longer wants to stay in her own home.  [name] stated she is concerned and does not know what to do?  States she only gives the pt Xanax as needed instead of daily and it helped to calm her down after 45 minutes.  Message sent to PCP and she requested to call [phone] ext [phone].

## 2020-12-30 NOTE — Telephone Encounter (Signed)
I called daughter but couldn't reach her. Just wanted to run options of med change by her. I am going to go ahead and send in the risperdal for her to start (this means she stops the zyprexa). We may have to titrate up with this; so it would be good to have maybe a virtual visit in a couple of weeks to see how she is doing with this change.

## 2021-01-05 ENCOUNTER — Emergency Department (HOSPITAL_COMMUNITY)
Admission: EM | Admit: 2021-01-05 | Discharge: 2021-01-05 | Disposition: A | Discharge status: Home / Self Care | Payer: PPO | Attending: Emergency Medicine | Admitting: Emergency Medicine

## 2021-01-05 ENCOUNTER — Other Ambulatory Visit: Payer: Self-pay

## 2021-01-05 ENCOUNTER — Emergency Department (HOSPITAL_COMMUNITY): Payer: PPO

## 2021-01-05 DIAGNOSIS — R0602 Shortness of breath: Secondary | ICD-10-CM | POA: Insufficient documentation

## 2021-01-05 DIAGNOSIS — Z79899 Other long term (current) drug therapy: Secondary | ICD-10-CM | POA: Diagnosis not present

## 2021-01-05 DIAGNOSIS — E039 Hypothyroidism, unspecified: Secondary | ICD-10-CM | POA: Diagnosis not present

## 2021-01-05 DIAGNOSIS — Z7982 Long term (current) use of aspirin: Secondary | ICD-10-CM | POA: Diagnosis not present

## 2021-01-05 DIAGNOSIS — K449 Diaphragmatic hernia without obstruction or gangrene: Secondary | ICD-10-CM | POA: Diagnosis not present

## 2021-01-05 DIAGNOSIS — I10 Essential (primary) hypertension: Secondary | ICD-10-CM | POA: Diagnosis not present

## 2021-01-05 DIAGNOSIS — F0280 Dementia in other diseases classified elsewhere without behavioral disturbance: Secondary | ICD-10-CM | POA: Diagnosis not present

## 2021-01-05 DIAGNOSIS — R600 Localized edema: Secondary | ICD-10-CM | POA: Insufficient documentation

## 2021-01-05 DIAGNOSIS — R0789 Other chest pain: Secondary | ICD-10-CM | POA: Diagnosis not present

## 2021-01-05 DIAGNOSIS — G301 Alzheimer's disease with late onset: Secondary | ICD-10-CM | POA: Insufficient documentation

## 2021-01-05 DIAGNOSIS — R079 Chest pain, unspecified: Secondary | ICD-10-CM | POA: Diagnosis not present

## 2021-01-05 LAB — BASIC METABOLIC PANEL
Anion gap: 10 (ref 5–15)
BUN: 18 mg/dL (ref 8–23)
CO2: 23 mmol/L (ref 22–32)
Calcium: 9 mg/dL (ref 8.9–10.3)
Chloride: 101 mmol/L (ref 98–111)
Creatinine, Ser: 1.18 mg/dL — ABNORMAL HIGH (ref 0.44–1.00)
GFR, Estimated: 48 mL/min — ABNORMAL LOW (ref >60)
Glucose, Bld: 146 mg/dL — ABNORMAL HIGH (ref 70–99)
Potassium: 3.5 mmol/L (ref 3.5–5.1)
Sodium: 134 mmol/L — ABNORMAL LOW (ref 135–145)

## 2021-01-05 LAB — CBC WITH DIFFERENTIAL/PLATELET
Abs Immature Granulocytes: 0.03 10*3/uL (ref 0.00–0.07)
Basophils Absolute: 0 10*3/uL (ref 0.0–0.1)
Basophils Relative: 1 %
Eosinophils Absolute: 0.1 10*3/uL (ref 0.0–0.5)
Eosinophils Relative: 1 %
HCT: 32.5 % — ABNORMAL LOW (ref 36.0–46.0)
Hemoglobin: 10.5 g/dL — ABNORMAL LOW (ref 12.0–15.0)
Immature Granulocytes: 1 %
Lymphocytes Relative: 16 %
Lymphs Abs: 1 10*3/uL (ref 0.7–4.0)
MCH: 29.6 pg (ref 26.0–34.0)
MCHC: 32.3 g/dL (ref 30.0–36.0)
MCV: 91.5 fL (ref 80.0–100.0)
Monocytes Absolute: 0.4 10*3/uL (ref 0.1–1.0)
Monocytes Relative: 7 %
Neutro Abs: 4.5 10*3/uL (ref 1.7–7.7)
Neutrophils Relative %: 74 %
Platelets: 221 10*3/uL (ref 150–400)
RBC: 3.55 MIL/uL — ABNORMAL LOW (ref 3.87–5.11)
RDW: 15 % (ref 11.5–15.5)
WBC: 6 10*3/uL (ref 4.0–10.5)
nRBC: 0 % (ref 0.0–0.2)

## 2021-01-05 LAB — BRAIN NATRIURETIC PEPTIDE: B Natriuretic Peptide: 67 pg/mL (ref 0.0–100.0)

## 2021-01-05 LAB — TROPONIN I (HIGH SENSITIVITY)
Troponin I (High Sensitivity): 6 ng/L (ref <18)
Troponin I (High Sensitivity): 7 ng/L (ref <18)

## 2021-01-05 MED ORDER — FUROSEMIDE 10 MG/ML IJ SOLN
20.0000 mg | Freq: Once | INTRAMUSCULAR | Status: AC
  Administered 2021-01-05: 20 mg via INTRAVENOUS
  Filled 2021-01-05: qty 2

## 2021-01-05 NOTE — ED Triage Notes (Signed)
Pt BIB daughter who is HCPOA for c/o SOB hx CHF.

## 2021-01-05 NOTE — Discharge Instructions (Addendum)
You were seen today for some shortness of breath.  Your work-up is largely reassuring.  You have no evidence of decompensated heart failure and your heart testing is reassuring.  You were given 1 dose of IV Lasix.  Follow-up with your primary physician if symptoms continue.

## 2021-01-05 NOTE — ED Provider Notes (Signed)
[facility] EMERGENCY DEPARTMENT Provider Note   CSN: [id number] Arrival date & time: 01/05/21  0255     History Chief Complaint  Patient presents with  . Shortness of Breath    Lori Bell is a 75 y.o. female.  HPI     This is a 75 year old female with a history of dementia, hypertension, hyperlipidemia, congestive heart failure who presents with shortness of breath.  Daughter brought patient in as she was concerned that she was having some issues with her heart failure.  Daughter noted that she has not slept much tonight and has been complaining of shortness of breath.  Patient endorses that she feels more short of breath when she lays flat.  She reports some increasing lower extremity edema.  No recent changes in her diuretic.  Patient reports that she has frequent episodes of chest pain and continues to have pain.  Last episode was this morning.  Daughter reports that she has a large hiatal hernia and she frequently complains of left-sided chest discomfort.  Daughter reports only medication change was addition of risperidal.  Past Medical History:  Diagnosis Date  . Acute (reversible) ischemia of large intestine, extent unspecified (HCC)   . Acute ischemic colitis (HCC)   . Anxiety disorder, unspecified   . ASCVD (arteriosclerotic cardiovascular disease)   . Depression   . Essential (primary) hypertension   . Gastrointestinal hemorrhage, unspecified   . GERD (gastroesophageal reflux disease)   . GI bleeding 06/10/2017  . Hyperlipidemia   . Hypertension   . Hypothyroidism   . Hypothyroidism, unspecified   . Major depressive disorder, single episode, unspecified   . Mixed hyperlipidemia   . Unspecified dementia without behavioral disturbance (HCC)   . Upper abdominal pain, unspecified     Patient Active Problem List   Diagnosis Date Noted  . Osteoporosis 05/02/2020  . Anxiety disorder, unspecified   . Major depressive disorder, single episode, unspecified   .  Mixed hyperlipidemia   . Acute (reversible) ischemia of large intestine, extent unspecified (HCC)   . Essential (primary) hypertension   . Hypothyroidism, unspecified   . 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 Depression 06/11/2017  . Essential tremor 04/10/2015  . Muscle weakness (generalized) 08/28/2012  . Cardiovascular disease 06/28/2009  . GASTROESOPHAGEAL REFLUX DISEASE 06/28/2009    Past Surgical History:  Procedure Laterality Date  . BIOPSY  06/11/2017   Procedure: BIOPSY;  Surgeon: West Bali, MD;  [facility];  Service: Endoscopy;;  colon  . BREAST BIOPSY  1997   Left  . COLONOSCOPY  2003   Dr. Ewing Schlein: internal and external hemorrhoids, a few tin hyperplastic-appearing polyps not biopsed  . COLONOSCOPY WITH PROPOFOL N/A 06/11/2017   Procedure: COLONOSCOPY WITH PROPOFOL;  Surgeon: West Bali, MD;  Location: [facility];  Service: Endoscopy;  Laterality: N/A;  . INNER EAR SURGERY  1966   Right  . TOTAL ABDOMINAL HYSTERECTOMY W/ BILATERAL SALPINGOOPHORECTOMY  05/2009  . TUBAL LIGATION  1970     OB History   No obstetric history on file.     Family History  Problem Relation Age of Onset  . Parkinsonism Father   . Coronary artery disease Other   . Cancer Brother   . Colon cancer Brother 14       deceased   . Dementia Brother   . Dementia Brother   . Dementia Sister     Social History   Tobacco Use  . Smoking status:  Never Smoker  . Smokeless tobacco: Never Used  Vaping Use  . Vaping Use: Never used  Substance Use Topics  . Alcohol use: No    Alcohol/week: 0.0 standard drinks  . Drug use: No    Home Medications Prior to Admission medications   Medication Sig Start Date End Date Taking? Authorizing Provider  alendronate (FOSAMAX) 70 MG tablet Take 1 tablet (70 mg total) by mouth once a week. Take with a full glass of water on an empty stomach. 03/02/20   Wynn BankerKoberlein, Junell C, MD  ALPRAZolam (XANAX) 0.5 MG tablet  Take 1 tablet (0.5 mg total) by mouth 2 (two) times daily as needed for anxiety. 12/30/20   Wynn BankerKoberlein, Junell C, MD  aspirin 81 MG tablet Take 81 mg by mouth at bedtime.    [provider]  benzonatate (TESSALON PERLES) 100 MG capsule Take 1 capsule (100 mg total) by mouth 3 (three) times daily as needed. 09/09/20   Daphine DeutscherMartin, Mary-Margaret, FNP  Docusate Calcium (STOOL SOFTENER PO) Take by mouth in the morning and at bedtime.    [provider]  DULoxetine (CYMBALTA) 60 MG capsule TAKE ONE CAPSULE BY MOUTH ONCE DAILY. 10/25/20   Wynn BankerKoberlein, Junell C, MD  famotidine (PEPCID) 20 MG tablet Take 1 tablet (20 mg total) by mouth 2 (two) times daily. 11/13/20   Koberlein, Paris LoreJunell C, MD  fluticasone (FLONASE) 50 MCG/ACT nasal spray Place 2 sprays into both nostrils daily. 12/20/20   Muthersbaugh, Dahlia ClientHannah, PA-C  furosemide (LASIX) 20 MG tablet Take 1 tablet (20 mg total) by mouth daily as needed for edema. 11/02/20   Koberlein, Paris LoreJunell C, MD  irbesartan-hydrochlorothiazide (AVALIDE) 150-12.5 MG tablet TAKE ONE TABLET BY MOUTH ONCE DAILY. 06/23/20   Wynn BankerKoberlein, Junell C, MD  levothyroxine (SYNTHROID) 25 MCG tablet TAKE ONE TABLET BY MOUTH ONCE DAILY BEFORE BREAKFAST. 10/25/20   Wynn BankerKoberlein, Junell C, MD  Melatonin 10 MG CAPS Take 10 mg by mouth at bedtime as needed. 08/17/20   Wynn BankerKoberlein, Junell C, MD  nitroGLYCERIN (NITROSTAT) 0.4 MG SL tablet Place 1 tablet (0.4 mg total) under the tongue every 5 (five) minutes as needed for chest pain. 07/02/20   Dione BoozeGlick, David, MD  pantoprazole (PROTONIX) 40 MG tablet TAKE ONE TABLET BY MOUTH ONCE DAILY. 06/24/20   Koberlein, Paris LoreJunell C, MD  pravastatin (PRAVACHOL) 40 MG tablet TAKE ONE TABLET BY MOUTH ONCE DAILY. 08/08/20   Wynn BankerKoberlein, Junell C, MD  propranolol ER (INDERAL LA) 80 MG 24 hr capsule Take 1 capsule (80 mg total) by mouth at bedtime. TAKE (1) CAPSULE BY MOUTH AT BEDTIME. 10/26/20   Wynn BankerKoberlein, Junell C, MD  risperiDONE (RISPERDAL) 0.25 MG tablet Start with 1 tablet PO  daily x 7 days, then increase to 2 tablets PO daily 12/30/20   Koberlein, Paris LoreJunell C, MD  QUEtiapine (SEROQUEL) 25 MG tablet Take 1 tablet (25 mg total) by mouth at bedtime. 06/17/20 07/13/20  Wynn BankerKoberlein, Junell C, MD    Allergies    Percocet [oxycodone-acetaminophen], Seroquel [quetiapine fumarate], and Simvastatin  Review of Systems   Review of Systems  Constitutional: Negative for fever.  Respiratory: Positive for shortness of breath. Negative for cough and wheezing.   Cardiovascular: Positive for chest pain and leg swelling.  Gastrointestinal: Negative for abdominal pain, nausea and vomiting.  Genitourinary: Negative for dysuria.  All other systems reviewed and are negative.   Physical Exam Updated Vital Signs BP 134/75 (BP Location: Left Arm)   Pulse 96   Temp 99.6 F (37.6 C) (Oral)  Resp 18   Ht 1.651 m (5\' 5" )   Wt 68 kg   SpO2 97%   BMI 24.96 kg/m   Physical Exam Vitals and nursing note reviewed.  Constitutional:      Appearance: She is well-developed. She is not ill-appearing.  HENT:     Head: Normocephalic and atraumatic.  Eyes:     Pupils: Pupils are equal, round, and reactive to light.  Cardiovascular:     Rate and Rhythm: Normal rate and regular rhythm.     Heart sounds: Normal heart sounds.  Pulmonary:     Effort: Pulmonary effort is normal. No respiratory distress.     Breath sounds: No wheezing.  Abdominal:     General: Bowel sounds are normal.     Palpations: Abdomen is soft.  Musculoskeletal:     Cervical back: Neck supple.     Right lower leg: Edema present.     Left lower leg: Edema present.     Comments: Trace bilateral lower extremity edema  Skin:    General: Skin is warm and dry.  Neurological:     Mental Status: She is alert and oriented to person, place, and time.  Psychiatric:        Mood and Affect: Mood normal.     ED Results / Procedures / Treatments   Labs (all labs ordered are listed, but only abnormal results are  displayed) Labs Reviewed  CBC WITH DIFFERENTIAL/PLATELET - Abnormal; Notable for the following components:      Result Value   RBC 3.55 (*)    Hemoglobin 10.5 (*)    HCT 32.5 (*)    All other components within normal limits  BASIC METABOLIC PANEL - Abnormal; Notable for the following components:   Sodium 134 (*)    Glucose, Bld 146 (*)    Creatinine, Ser 1.18 (*)    GFR, Estimated 48 (*)    All other components within normal limits  BRAIN NATRIURETIC PEPTIDE  TROPONIN I (HIGH SENSITIVITY)  TROPONIN I (HIGH SENSITIVITY)    EKG EKG Interpretation  Date/Time:  Thursday Jan 05 2021 03:34:59 EDT Ventricular Rate:  91 PR Interval:  144 QRS Duration: 93 QT Interval:  370 QTC Calculation: 456 R Axis:   -19 Text Interpretation: Sinus rhythm Borderline left axis deviation Nonspecific T abnrm, anterolateral leads Confirmed by 06-15-2002 (Ross Marcus) on 01/05/2021 4:26:10 AM   Radiology DG Chest 2 View  Result Date: 01/05/2021 CLINICAL DATA:  75 year old female with acute shortness of breath and left side chest pain. EXAM: CHEST - 2 VIEW COMPARISON:  CTA chest 07/02/2020 and earlier. FINDINGS: Upright AP and lateral views of the chest. Small to moderate gastric hiatal hernia has not significantly changed. Other mediastinal contours are within normal limits. Visualized tracheal air column is within normal limits. Lung volumes are lower than baseline today. But there is no pneumothorax, pulmonary edema, pleural effusion or confluent pulmonary opacity. Osteopenia in the spine. Chronic deformity proximal right humerus is stable since last year. No acute osseous abnormality identified. Negative visible bowel gas pattern. IMPRESSION: No acute cardiopulmonary abnormality. Electronically Signed   By: 07/04/2020 M.D.   On: 01/05/2021 04:26    Procedures Procedures   Medications Ordered in ED Medications  furosemide (LASIX) injection 20 mg (20 mg Intravenous Given 01/05/21 0617)    ED Course  I  have reviewed the triage vital signs and the nursing notes.  Pertinent labs & imaging results that were available during my care of the patient  were reviewed by me and considered in my medical decision making (see chart for details).    MDM Rules/Calculators/A&P                          Patient presents with shortness of breath.  Has also had some intermittent chest pain but daughter states this is not abnormal.  She is nontoxic-appearing and vital signs are reassuring.  She is satting well on room air and is in no respiratory distress.  Considerations include but not limited to, heart failure, pneumonia, atypical ACS.  She does not appear significantly volume overloaded clinically on exam.  EKG without acute ischemic or arrhythmic changes.  Chest x-ray without pneumothorax, pneumonia, pulmonary edema.  Troponin x2 negative.  Doubt ACS.  Lab work-up is largely reassuring including metabolic panel and BNP.  Given that patient does have some orthopnea, she was given 1 dose of her daily Lasix IV for any mild overload she may be experiencing that is not super apparent on exam.  Discussed the work-up and treatment with the patient and her daughter.  Patient to be discharged with follow-up with her primary physician.  After history, exam, and medical workup I feel the patient has been appropriately medically screened and is safe for discharge home. Pertinent diagnoses were discussed with the patient. Patient was given return precautions.  Final Clinical Impression(s) / ED Diagnoses Final diagnoses:  Shortness of breath    Rx / DC Orders ED Discharge Orders    None       Shon Baton, MD 01/05/21 [phone]

## 2021-01-05 NOTE — ED Notes (Signed)
Patient transported to X-ray 

## 2021-01-06 ENCOUNTER — Encounter: Payer: Self-pay | Admitting: Family Medicine

## 2021-01-09 ENCOUNTER — Other Ambulatory Visit: Payer: Self-pay | Admitting: Family Medicine

## 2021-01-09 MED ORDER — OLANZAPINE 5 MG PO TABS: 5.0000 mg | ORAL_TABLET | Freq: Every day | ORAL | 1 refills | Status: DC

## 2021-01-12 ENCOUNTER — Other Ambulatory Visit: Payer: Self-pay | Admitting: Family Medicine

## 2021-01-12 ENCOUNTER — Other Ambulatory Visit: Payer: Self-pay

## 2021-01-12 ENCOUNTER — Ambulatory Visit: Payer: PPO | Admitting: Nurse Practitioner

## 2021-01-12 DIAGNOSIS — F0281 Dementia in other diseases classified elsewhere with behavioral disturbance: Secondary | ICD-10-CM

## 2021-01-12 DIAGNOSIS — Z515 Encounter for palliative care: Secondary | ICD-10-CM | POA: Diagnosis not present

## 2021-01-12 DIAGNOSIS — G309 Alzheimer's disease, unspecified: Secondary | ICD-10-CM

## 2021-01-12 NOTE — Progress Notes (Signed)
L[facility] Consult Note Telephone: [phone]  Fax: [phone]    Date of encounter: 01/12/21 PATIENT NAME: Lori Bell [place] [id number]   [phone] (home)  DOB: 1946/08/05 MRN: [id number]  PRIMARY CARE PROVIDER:    Caren Macadam, MD,  [place] [address] [phone]  REFERRING PROVIDER:   Caren Macadam, [place],  [place] [address] [phone]  RESPONSIBLE PARTY:    Contact Information    Name Relation Home Work [place] Daughter   [phone]   Lori Bell, Lori Bell Daughter [phone]     Lori Bell Daughter [phone]       I met by telephonic means with patient and her daughter Lori Bell. Palliative Care was asked to follow this patient by consultation request of  Caren Macadam, MD to address advance care planning and complex medical decision making. This is a follow up visit.                                   ASSESSMENT AND PLAN / RECOMMENDATIONS:   Advance Care Planning/Goals of Care:  CODE STATUS: DNR Goal of care: Patient's goal of care is comfort. Directive: Daughter report signed DNR and MOST forms present in home.  Symptom Management/Plan: Dementia related agitation/agression and psychosis: Patient's daughter report poor response to Risperdal 0.80m twice a day. Report patient with increased confusion, crying episodes and agitation. Risperdal was discontinued by her PCP and Olanzapine was restarted. Patient currently on Olanzapine 543mat bed time, plan is to add 2.58m50mn the morning in the next couple of days if agitation persist. Lori Bell report patient doing better on the Olanzapine but still having visual and auditory hallucinations. Discussed with Lori Bell consideration for referral to a neuropsychiatry if difficulty controlling patient's symptoms on Olanzapine. Discussed that there is room to increase dose of  Olanzapine if needed as its max dose is 77m49mammy verbalized understanding. Lori Bell however stated she would rather not want patient referred to anymore specialist as it is getting more difficult taking patient to medical appointments.  Continue Olanzapine and Xanax as needed. Provided general support and encouragement, no other unmet needs identified at this time.  Follow up Palliative Care Visit: Palliative care will continue to follow for complex medical decision making, advance care planning, and clarification of goals. Return in about weeks or prn.  I spent 20 minutes providing this consultation. More than 50% of the time in this consultation was spent in counseling and care coordination.  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  Patient has worsening decline in cognition with reports or hallucination and agitation impeding her daily function. Patient was seen by palliative care on 12/27/2020, at the time she was on Olanzapine 58mg.13mmily reported no improvement in symptoms. Risperdal was recommended, Olanzapine was discontinued and patient was started on Risperdal 0.258mg 60me a day.  Past Medical History:  Diagnosis Date  . Acute (reversible) ischemia of large intestine, extent unspecified (HCC)  [place] ischemic colitis (HCC)  [place] disorder, unspecified   .  ASCVD (arteriosclerotic cardiovascular disease)   . Depression   . Essential (primary) hypertension   . Gastrointestinal hemorrhage, unspecified   . GERD (gastroesophageal reflux disease)   . GI bleeding 06/10/2017  . Hyperlipidemia   .  Hypertension   . Hypothyroidism   . Hypothyroidism, unspecified   . Major depressive disorder, single episode, unspecified   . Mixed hyperlipidemia   . Unspecified dementia without behavioral disturbance ([place])   . Upper abdominal pain, unspecified    Current Outpatient Medications on File Prior to Visit  Medication Sig Dispense Refill  . alendronate (FOSAMAX) 70 MG tablet Take 1 tablet (70 mg total) by mouth once a week. Take with a full glass of water on an empty stomach. 12 tablet 3  . ALPRAZolam (XANAX) 0.5 MG tablet Take 1 tablet (0.5 mg total) by mouth 2 (two) times daily as needed for anxiety. 60 tablet 2  . aspirin 81 MG tablet Take 81 mg by mouth at bedtime.    . benzonatate (TESSALON PERLES) 100 MG capsule Take 1 capsule (100 mg total) by mouth 3 (three) times daily as needed. 20 capsule 0  . Docusate Calcium (STOOL SOFTENER PO) Take by mouth in the morning and at bedtime.    . DULoxetine (CYMBALTA) 60 MG capsule TAKE ONE CAPSULE BY MOUTH ONCE DAILY. 90 capsule 0  . famotidine (PEPCID) 20 MG tablet Take 1 tablet (20 mg total) by mouth 2 (two) times daily. 60 tablet 2  . fluticasone (FLONASE) 50 MCG/ACT nasal spray Place 2 sprays into both nostrils daily. 9.9 g 0  . furosemide (LASIX) 20 MG tablet Take 1 tablet (20 mg total) by mouth daily as needed for edema. 90 tablet 1  . irbesartan-hydrochlorothiazide (AVALIDE) 150-12.5 MG tablet TAKE ONE TABLET BY MOUTH ONCE DAILY. 90 tablet 1  . levothyroxine (SYNTHROID) 25 MCG tablet TAKE ONE TABLET BY MOUTH ONCE DAILY BEFORE BREAKFAST. 90 tablet 0  . Melatonin 10 MG CAPS Take 10 mg by mouth at bedtime as needed. 30 capsule   . nitroGLYCERIN (NITROSTAT) 0.4 MG SL tablet Place 1 tablet (0.4 mg total) under the tongue every 5 (five) minutes as needed for chest pain. 30 tablet 0  . OLANZapine (ZYPREXA) 5 MG tablet Take 1 tablet (5 mg total) by mouth at bedtime. 90 tablet 1  . pantoprazole (PROTONIX) 40 MG tablet TAKE ONE TABLET BY MOUTH ONCE  DAILY. 90 tablet 1  . pravastatin (PRAVACHOL) 40 MG tablet TAKE ONE TABLET BY MOUTH ONCE DAILY. 90 tablet 0  . propranolol ER (INDERAL LA) 80 MG 24 hr capsule Take 1 capsule (80 mg total) by mouth at bedtime. TAKE (1) CAPSULE BY MOUTH AT BEDTIME. 90 capsule 0  . [DISCONTINUED] QUEtiapine (SEROQUEL) 25 MG tablet Take 1 tablet (25 mg total) by mouth at bedtime. 90 tablet 1   No current facility-administered medications on file prior to visit.    Thank you for the opportunity to participate in the care of Ms. Motton.  The palliative care team will continue to follow. Please call our office at [phone] if we can be of additional assistance.   Alba Destine, NP DNP,AGPCNP-BC  COVID-19 PATIENT SCREENING TOOL Asked and negative response unless otherwise noted:   Have you had symptoms of covid, tested positive or been in contact with someone with symptoms/positive test in the past 5-10 days?

## 2021-01-26 ENCOUNTER — Other Ambulatory Visit: Payer: Self-pay | Admitting: Family Medicine

## 2021-02-01 ENCOUNTER — Telehealth (INDEPENDENT_AMBULATORY_CARE_PROVIDER_SITE_OTHER): Payer: PPO | Admitting: Family Medicine

## 2021-02-01 DIAGNOSIS — G8929 Other chronic pain: Secondary | ICD-10-CM

## 2021-02-01 DIAGNOSIS — G309 Alzheimer's disease, unspecified: Secondary | ICD-10-CM

## 2021-02-01 DIAGNOSIS — R079 Chest pain, unspecified: Secondary | ICD-10-CM

## 2021-02-01 DIAGNOSIS — F0280 Dementia in other diseases classified elsewhere without behavioral disturbance: Secondary | ICD-10-CM | POA: Diagnosis not present

## 2021-02-01 DIAGNOSIS — I10 Essential (primary) hypertension: Secondary | ICD-10-CM

## 2021-02-01 DIAGNOSIS — M546 Pain in thoracic spine: Secondary | ICD-10-CM | POA: Diagnosis not present

## 2021-02-01 MED ORDER — IRBESARTAN 150 MG PO TABS: 150.0000 mg | ORAL_TABLET | Freq: Every day | ORAL | 1 refills | Status: AC

## 2021-02-01 NOTE — Progress Notes (Signed)
Virtual Visit via Video Note I connected with Lori Bell on 02/01/21 at  1:00 PM EDT by a video enabled telemedicine application and verified that I am speaking with the correct person using two identifiers.  Location patient: home Location provider: [facility]  [address] [place], [place] [address] Persons participating in the virtual visit: patient, provider  I discussed the limitations of evaluation and management by telemedicine and the availability of in person appointments. The patient expressed understanding and agreed to proceed.   Lori Bell DOB: 1945-10-11 Encounter date: 02/01/2021  This is a 75 y.o. female who presents with No chief complaint on file.   History of present illness: States she feels tired, more "give out".   Here with daughter [name] today. Went back to zyprexa - doing 2.5mg  in morning and 5mg  at night. States that aggressive behavior is a lot better than it was.   Still with some hallucinations; but most are tolerable/redirectable.   Sleep: pretty good overall. Melatonin at night helps.   Appetite: so-so. Daughter states that she isn't eating as much. Not enjoying junk food as much. Does occasionally get choked from time to time. Notes more with thicker foods like meats. Some of pills seemed to bother her - like fish oil, calcium, so daughter just had her stop that. At this point daughter states that it is not necessary to evaluate this at present. At least one good meal/day.   Using cbd gummies which also seem to help with calming her.   Due to insurance they have not been able to have anyone out to house to help with care.   Complains of chest pain every day. Daughter states that she thinks it is more anxiety than anything else. Protonix and pepcid have seemed to help. Gets self worked up with "wanting to go home". Notes that complaints increase between 3-6. Does state she feels short of breath, but only appears that way when she  is really worked up.   Daughter uses xanax BID on weekends 1/4 tab in morning and 1/2 at night works well. Has helped her when under care of other daughters.   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 Hasn't had any injury since feb when she feel. Complains 2-3 days/week of discomfort. Tylenol arthritis seems to help.   No blood in stools.   Daughter hasn't weighed at home. Not sure of weight.   Seems to physically give out a little easier.   Allergies  Allergen Reactions   Percocet [Oxycodone-Acetaminophen] Hives and Swelling   Seroquel [Quetiapine Fumarate]     Neck pain   Simvastatin    Current Meds  Medication Sig   irbesartan (AVAPRO) 150 MG tablet Take 1 tablet (150 mg total) by mouth daily.    Review of Systems  Constitutional:  Negative for chills, fatigue and fever.  Respiratory:  Negative for cough, chest tightness, shortness of breath and wheezing.   Cardiovascular:  Negative for chest pain, palpitations and leg swelling.  Psychiatric/Behavioral:  Positive for agitation, confusion, decreased concentration, hallucinations and sleep disturbance. The patient is nervous/anxious.    Objective:  There were no vitals taken for this visit.      BP Readings from Last 3 Encounters:  01/05/21 130/60  11/02/20 (!) 120/58  10/25/20 118/68   Wt Readings from Last 3 Encounters:  01/05/21 150 lb (68 kg)  11/02/20 154 lb 1.6 oz (69.9 kg)  10/25/20 159 lb (72.1 kg)  EXAM:  GENERAL: alert, oriented, appears well and in no acute distress  HEENT: atraumatic, conjunctiva clear, no obvious abnormalities on inspection of external nose and ears  NECK: normal movements of the head and neck  LUNGS: on inspection no signs of respiratory distress, breathing rate appears normal, no obvious gross SOB, gasping or wheezing  CV: no obvious cyanosis  MS: moves all visible extremities without noticeable abnormality  PSYCH/NEURO: pleasant and cooperative,  she is oriented to person only.   Assessment/Plan  1. Essential (primary) hypertension Irbesartan 150mg , propranolol 80mg . Bp is well controlled. Encouraged home checks since she has lost some weight; so we can make sure she is not getting hypotensive.  2. Dementia due to Alzheimer's disease (HCC) Progression of dementia. Frustrating that with her advantage plan she is not able to get help out to the house; daughters are caring for her currently. Her agitation is better controlled with the zyprexa (she did not do well with risperdal) and we discussed that we can continue to increase dose if needed for agitation.   3. Chest pain, unspecified type She recently was in ER with the chest pain - negative troponins, ekg. Do not feel this is cardiac and she is not tolerant of imaging/evaluation studies at this point. We discussed trial of more regular tylenol for upper back pain and discussed that as back bothers her; chest may also bother her. If tylenol is not helpful, daughter will let me know.   4. Chronic thoracic back pain, unspecified back pain laterality See above. Not interested in additional intervention at this time; but we can consider imaging if tylenol is not helpful.      I discussed the assessment and treatment plan with the patient. The patient was provided an opportunity to ask questions and all were answered. The patient agreed with the plan and demonstrated an understanding of the instructions.   The patient was advised to call back or seek an in-person evaluation if the symptoms worsen or if the condition fails to improve as anticipated.  I provided 30 minutes of non-face-to-face time during this encounter.   , MD

## 2021-02-09 ENCOUNTER — Other Ambulatory Visit: Payer: Self-pay | Admitting: Family Medicine

## 2021-02-09 ENCOUNTER — Encounter: Payer: Self-pay | Admitting: Family Medicine

## 2021-02-10 ENCOUNTER — Other Ambulatory Visit: Payer: Self-pay | Admitting: Family Medicine

## 2021-02-10 MED ORDER — OLANZAPINE 5 MG PO TABS: ORAL_TABLET | ORAL | 1 refills | Status: DC

## 2021-03-01 ENCOUNTER — Other Ambulatory Visit: Payer: PPO | Admitting: Nurse Practitioner

## 2021-03-01 ENCOUNTER — Other Ambulatory Visit: Payer: Self-pay

## 2021-03-01 VITALS — BP 158/80 | HR 63 | Resp 18

## 2021-03-01 DIAGNOSIS — Z515 Encounter for palliative care: Secondary | ICD-10-CM

## 2021-03-01 DIAGNOSIS — F0281 Dementia in other diseases classified elsewhere with behavioral disturbance: Secondary | ICD-10-CM

## 2021-03-01 DIAGNOSIS — G309 Alzheimer's disease, unspecified: Secondary | ICD-10-CM | POA: Diagnosis not present

## 2021-03-01 NOTE — Progress Notes (Signed)
[facility] Consult Note Telephone: [phone]  Fax: [phone]   Date of encounter: 03/01/21 PATIENT NAME: Lori Bell [place] [id number]   [phone] (home)  DOB: 07/30/1946 MRN: [id number]  PRIMARY CARE PROVIDER:    Caren Macadam, MD,  [place] [address] [phone]  REFERRING PROVIDER:   Caren Macadam, [place],  [place] [address] [phone]  RESPONSIBLE PARTY:    Contact Information     Name Relation Home Work [place] Daughter   [phone]   Stacye, Noori Daughter [phone]     Gabriela Eves Daughter [phone]       I met face to face with patient in home. Her grandson present in home during visit. Palliative Care was asked to follow this patient by consultation request of  Caren Macadam, MD to address advance care planning and complex medical decision making. This is a follow up visit.                                  ASSESSMENT AND PLAN / RECOMMENDATIONS:   Advance Care Planning/Goals of Care: Goals include to maximize quality of life and symptom management.   CODE STATUS: DNR Patient's goal of care is comfort. Signed DNR and MOST forms present in home, copy on [facility] EMR. Details of MOST form include limited additional intervention, antibiotics if indicated, IV fluids for a defined trial period, no feeding tube.  Symptom Management/Plan: Dementia with behavior concerns: Patient calm and cooperative during visit today, no sign of acute distress. Continue current plan of care. Continue supportive care. Consider increasing dose of Zyprexa if worsening symptoms, max dose of Zyprexa is 66m a day. Discussion on disease trajectory of dementia with family, as it is progressive and terminal and likely to eventually lead to dysphagia, weight loss and immobility. Emotional and supportive care provided.   Provided general support and encouragement. Questions and concerns were addressed. Family to call with questions and/or concerns.  Follow up Palliative Care Visit: Palliative care will continue to follow for complex medical decision making, advance care planning, and clarification of goals. Return in about 4-6 weeks or prn.  PPS: 50%  HOSPICE ELIGIBILITY/DIAGNOSIS: TBD  CHIEF COMPLAIN: palliative care visit for follow up on Dementia  History obtained from review of Epic EMR, discussion with Ms. STilsonand her daughter TLynelle Smokeon phone.  HISTORY OF PRESENT ILLNESS:  VNELLIE PESTERis a 75y.o. 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  Patient seen today for follow up on behavior concern related to her dementia. She is currently on Zyprexa 2.54min the mornings and 19m73mt bedtime. Takes Xanax 0.219m61mily as needed. Spoke with her daughter TammLynelle Smokephone, she report mild improvement in behavior, report patient still has difficulty staying still, report patient wanders a lot within her home, report symptoms affects her sleep during the night. No report of falls, ambulates independently without assistive device. Report good appetite, no report of weight loss. Report occasional incontinence episodes. No report of fever or chills.Ten systems reviewed and are negative for acute change, except as noted in the HPI.   CBC    Component Value Date/Time   WBC 6.0 01/05/2021 0419   RBC 3.55 (L) 01/05/2021 04193559  HGB 10.5 (L) 01/05/2021 0419   HCT 32.5 (L) 01/05/2021 0419   PLT 221 01/05/2021 0419   MCV 91.5 01/05/2021 0419   MCH 29.6 01/05/2021 0419   MCHC 32.3 01/05/2021 0419   RDW 15.0 01/05/2021 0419   LYMPHSABS 1.0 01/05/2021 0419   MONOABS 0.4 01/05/2021 0419   EOSABS 0.1 01/05/2021 0419   BASOSABS 0.0 01/05/2021 0419    CMP     Component Value Date/Time    NA 134 (L) 01/05/2021 0419   NA 137 02/18/2019 0000   K 3.5 01/05/2021 0419   CL 101 01/05/2021 0419   CO2 23 01/05/2021 0419   GLUCOSE 146 (H) 01/05/2021 0419   BUN 18 01/05/2021 0419   BUN 14 02/18/2019 0000   CREATININE 1.18 (H) 01/05/2021 0419   CREATININE 1.14 (H) 05/26/2020 0756   CALCIUM 9.0 01/05/2021 0419   PROT 6.9 10/25/2020 0946   ALBUMIN 3.6 10/25/2020 0946   AST 12 10/25/2020 0946   ALT 11 10/25/2020 0946   ALKPHOS 71 10/25/2020 0946   BILITOT 0.4 10/25/2020 0946   GFRNONAA 48 (L) 01/05/2021 0419   GFRAA 61 02/18/2019 0000    I reviewed available labs, medications, imaging, studies and related documents from the EMR.  Records reviewed and summarized above.   Physical Exam: General: well appearing, cooperative, sitting in her living room holding her dog, NAD EYES: anicteric sclera, no discharge ENMT: intact hearing, oral mucous membranes moist CV:  no LE edema Pulmonary: no increased work of breathing, no cough, no audible wheezes, room air Abdomen:  no ascites MSK:  moves all extremities, ambulatory Skin: warm and dry, no rashes or wounds on visible skin Neuro: severe cognitive impairment Psych: anxious affect today, A and O x 1 Hem/lymph/immuno: no widespread bruising  Past Medical History:  Diagnosis Date   Acute (reversible) ischemia of large intestine, extent unspecified (HCC)    Acute ischemic colitis ([place] [place])    Anxiety disorder, unspecified    ASCVD (arteriosclerotic cardiovascular disease)    Depression    Essential (primary) hypertension    Gastrointestinal hemorrhage, unspecified    GERD (gastroesophageal reflux disease)    GI bleeding 06/10/2017   Hyperlipidemia    Hypertension    Hypothyroidism    Hypothyroidism, unspecified    Major depressive disorder, single episode, unspecified    Mixed hyperlipidemia    Unspecified dementia without behavioral disturbance ([facility])    Upper abdominal pain, unspecified     Thank you for the opportunity to  participate in the care of [name].  The palliative care team will continue to follow. Please call our office at [phone] if we can be of additional assistance.   Jari Favre, DNP,AGPCNP-BC  COVID-19 PATIENT SCREENING TOOL Asked and negative response unless otherwise noted:   Have you had symptoms of covid, tested positive or been in contact with someone with symptoms/positive test in the past 5-10 days?

## 2021-03-08 ENCOUNTER — Other Ambulatory Visit: Payer: Self-pay | Admitting: Family Medicine

## 2021-03-22 ENCOUNTER — Encounter: Payer: Self-pay | Admitting: Family Medicine

## 2021-03-27 ENCOUNTER — Other Ambulatory Visit: Payer: Self-pay | Admitting: Family Medicine

## 2021-04-01 ENCOUNTER — Inpatient Hospital Stay (HOSPITAL_COMMUNITY)
Admission: EM | Admit: 2021-04-01 | Discharge: 2021-04-03 | DRG: 062 | Disposition: A | Discharge status: Home / Self Care | Payer: PPO | Attending: Internal Medicine | Admitting: Internal Medicine

## 2021-04-01 ENCOUNTER — Emergency Department (HOSPITAL_COMMUNITY): Payer: PPO

## 2021-04-01 ENCOUNTER — Other Ambulatory Visit: Payer: Self-pay

## 2021-04-01 ENCOUNTER — Encounter (HOSPITAL_COMMUNITY): Payer: Self-pay | Admitting: Emergency Medicine

## 2021-04-01 DIAGNOSIS — E039 Hypothyroidism, unspecified: Secondary | ICD-10-CM | POA: Diagnosis present

## 2021-04-01 DIAGNOSIS — T45615A Adverse effect of thrombolytic drugs, initial encounter: Secondary | ICD-10-CM | POA: Diagnosis present

## 2021-04-01 DIAGNOSIS — R1310 Dysphagia, unspecified: Secondary | ICD-10-CM | POA: Diagnosis present

## 2021-04-01 DIAGNOSIS — F0280 Dementia in other diseases classified elsewhere without behavioral disturbance: Secondary | ICD-10-CM | POA: Diagnosis present

## 2021-04-01 DIAGNOSIS — G319 Degenerative disease of nervous system, unspecified: Secondary | ICD-10-CM | POA: Diagnosis present

## 2021-04-01 DIAGNOSIS — R4781 Slurred speech: Secondary | ICD-10-CM | POA: Diagnosis present

## 2021-04-01 DIAGNOSIS — I6350 Cerebral infarction due to unspecified occlusion or stenosis of unspecified cerebral artery: Secondary | ICD-10-CM

## 2021-04-01 DIAGNOSIS — I6782 Cerebral ischemia: Secondary | ICD-10-CM | POA: Diagnosis not present

## 2021-04-01 DIAGNOSIS — F0281 Dementia in other diseases classified elsewhere with behavioral disturbance: Secondary | ICD-10-CM | POA: Diagnosis present

## 2021-04-01 DIAGNOSIS — Z8249 Family history of ischemic heart disease and other diseases of the circulatory system: Secondary | ICD-10-CM

## 2021-04-01 DIAGNOSIS — L89896 Pressure-induced deep tissue damage of other site: Secondary | ICD-10-CM | POA: Diagnosis present

## 2021-04-01 DIAGNOSIS — Z8673 Personal history of transient ischemic attack (TIA), and cerebral infarction without residual deficits: Secondary | ICD-10-CM | POA: Diagnosis not present

## 2021-04-01 DIAGNOSIS — Z8719 Personal history of other diseases of the digestive system: Secondary | ICD-10-CM

## 2021-04-01 DIAGNOSIS — F32A Depression, unspecified: Secondary | ICD-10-CM | POA: Diagnosis present

## 2021-04-01 DIAGNOSIS — E876 Hypokalemia: Secondary | ICD-10-CM | POA: Diagnosis present

## 2021-04-01 DIAGNOSIS — R001 Bradycardia, unspecified: Secondary | ICD-10-CM | POA: Diagnosis not present

## 2021-04-01 DIAGNOSIS — R0689 Other abnormalities of breathing: Secondary | ICD-10-CM | POA: Diagnosis not present

## 2021-04-01 DIAGNOSIS — Z66 Do not resuscitate: Secondary | ICD-10-CM | POA: Diagnosis present

## 2021-04-01 DIAGNOSIS — J323 Chronic sphenoidal sinusitis: Secondary | ICD-10-CM | POA: Diagnosis present

## 2021-04-01 DIAGNOSIS — R4020 Unspecified coma: Secondary | ICD-10-CM | POA: Diagnosis not present

## 2021-04-01 DIAGNOSIS — Z20822 Contact with and (suspected) exposure to covid-19: Secondary | ICD-10-CM | POA: Diagnosis present

## 2021-04-01 DIAGNOSIS — Z515 Encounter for palliative care: Secondary | ICD-10-CM | POA: Diagnosis not present

## 2021-04-01 DIAGNOSIS — G309 Alzheimer's disease, unspecified: Secondary | ICD-10-CM | POA: Diagnosis present

## 2021-04-01 DIAGNOSIS — E782 Mixed hyperlipidemia: Secondary | ICD-10-CM | POA: Diagnosis present

## 2021-04-01 DIAGNOSIS — Z79890 Hormone replacement therapy (postmenopausal): Secondary | ICD-10-CM

## 2021-04-01 DIAGNOSIS — I10 Essential (primary) hypertension: Secondary | ICD-10-CM | POA: Diagnosis present

## 2021-04-01 DIAGNOSIS — N390 Urinary tract infection, site not specified: Secondary | ICD-10-CM | POA: Diagnosis present

## 2021-04-01 DIAGNOSIS — F419 Anxiety disorder, unspecified: Secondary | ICD-10-CM | POA: Diagnosis present

## 2021-04-01 DIAGNOSIS — R404 Transient alteration of awareness: Secondary | ICD-10-CM | POA: Diagnosis not present

## 2021-04-01 DIAGNOSIS — G459 Transient cerebral ischemic attack, unspecified: Secondary | ICD-10-CM | POA: Diagnosis present

## 2021-04-01 DIAGNOSIS — G8929 Other chronic pain: Secondary | ICD-10-CM | POA: Diagnosis present

## 2021-04-01 DIAGNOSIS — I672 Cerebral atherosclerosis: Secondary | ICD-10-CM | POA: Diagnosis not present

## 2021-04-01 DIAGNOSIS — D649 Anemia, unspecified: Secondary | ICD-10-CM | POA: Diagnosis present

## 2021-04-01 DIAGNOSIS — K219 Gastro-esophageal reflux disease without esophagitis: Secondary | ICD-10-CM | POA: Diagnosis present

## 2021-04-01 DIAGNOSIS — R2990 Unspecified symptoms and signs involving the nervous system: Secondary | ICD-10-CM | POA: Diagnosis present

## 2021-04-01 DIAGNOSIS — Z820 Family history of epilepsy and other diseases of the nervous system: Secondary | ICD-10-CM

## 2021-04-01 DIAGNOSIS — R29703 NIHSS score 3: Secondary | ICD-10-CM | POA: Diagnosis present

## 2021-04-01 DIAGNOSIS — I2510 Atherosclerotic heart disease of native coronary artery without angina pectoris: Secondary | ICD-10-CM | POA: Diagnosis present

## 2021-04-01 DIAGNOSIS — I639 Cerebral infarction, unspecified: Secondary | ICD-10-CM | POA: Diagnosis not present

## 2021-04-01 DIAGNOSIS — N179 Acute kidney failure, unspecified: Secondary | ICD-10-CM | POA: Diagnosis present

## 2021-04-01 DIAGNOSIS — R29810 Facial weakness: Secondary | ICD-10-CM | POA: Diagnosis present

## 2021-04-01 DIAGNOSIS — Z79899 Other long term (current) drug therapy: Secondary | ICD-10-CM

## 2021-04-01 DIAGNOSIS — Z7189 Other specified counseling: Secondary | ICD-10-CM | POA: Diagnosis not present

## 2021-04-01 DIAGNOSIS — R531 Weakness: Secondary | ICD-10-CM | POA: Diagnosis not present

## 2021-04-01 DIAGNOSIS — I6389 Other cerebral infarction: Secondary | ICD-10-CM | POA: Diagnosis not present

## 2021-04-01 DIAGNOSIS — M810 Age-related osteoporosis without current pathological fracture: Secondary | ICD-10-CM | POA: Diagnosis present

## 2021-04-01 LAB — COMPREHENSIVE METABOLIC PANEL
ALT: 13 U/L (ref 0–44)
AST: 12 U/L — ABNORMAL LOW (ref 15–41)
Albumin: 3.4 g/dL — ABNORMAL LOW (ref 3.5–5.0)
Alkaline Phosphatase: 75 U/L (ref 38–126)
Anion gap: 9 (ref 5–15)
BUN: 11 mg/dL (ref 8–23)
CO2: 28 mmol/L (ref 22–32)
Calcium: 8.8 mg/dL — ABNORMAL LOW (ref 8.9–10.3)
Chloride: 101 mmol/L (ref 98–111)
Creatinine, Ser: 1.29 mg/dL — ABNORMAL HIGH (ref 0.44–1.00)
GFR, Estimated: 43 mL/min — ABNORMAL LOW (ref >60)
Glucose, Bld: 116 mg/dL — ABNORMAL HIGH (ref 70–99)
Potassium: 2.7 mmol/L — CL (ref 3.5–5.1)
Sodium: 138 mmol/L (ref 135–145)
Total Bilirubin: 0.6 mg/dL (ref 0.3–1.2)
Total Protein: 6.9 g/dL (ref 6.5–8.1)

## 2021-04-01 LAB — DIFFERENTIAL
Abs Immature Granulocytes: 0.01 10*3/uL (ref 0.00–0.07)
Basophils Absolute: 0 10*3/uL (ref 0.0–0.1)
Basophils Relative: 1 %
Eosinophils Absolute: 0.2 10*3/uL (ref 0.0–0.5)
Eosinophils Relative: 3 %
Immature Granulocytes: 0 %
Lymphocytes Relative: 47 %
Lymphs Abs: 2.4 10*3/uL (ref 0.7–4.0)
Monocytes Absolute: 0.4 10*3/uL (ref 0.1–1.0)
Monocytes Relative: 8 %
Neutro Abs: 2 10*3/uL (ref 1.7–7.7)
Neutrophils Relative %: 41 %

## 2021-04-01 LAB — RESP PANEL BY RT-PCR (FLU A&B, COVID) ARPGX2
Influenza A by PCR: NEGATIVE
Influenza B by PCR: NEGATIVE
SARS Coronavirus 2 by RT PCR: NEGATIVE

## 2021-04-01 LAB — CBC
HCT: 31.8 % — ABNORMAL LOW (ref 36.0–46.0)
Hemoglobin: 10.4 g/dL — ABNORMAL LOW (ref 12.0–15.0)
MCH: 30.7 pg (ref 26.0–34.0)
MCHC: 32.7 g/dL (ref 30.0–36.0)
MCV: 93.8 fL (ref 80.0–100.0)
Platelets: 216 10*3/uL (ref 150–400)
RBC: 3.39 MIL/uL — ABNORMAL LOW (ref 3.87–5.11)
RDW: 13.7 % (ref 11.5–15.5)
WBC: 5 10*3/uL (ref 4.0–10.5)
nRBC: 0 % (ref 0.0–0.2)

## 2021-04-01 LAB — PROTIME-INR
INR: 1 (ref 0.8–1.2)
Prothrombin Time: 12.7 seconds (ref 11.4–15.2)

## 2021-04-01 LAB — APTT: aPTT: 26 seconds (ref 24–36)

## 2021-04-01 LAB — ETHANOL: Alcohol, Ethyl (B): <10 mg/dL (ref <10)

## 2021-04-01 LAB — CBG MONITORING, ED: Glucose-Capillary: 112 mg/dL — ABNORMAL HIGH (ref 70–99)

## 2021-04-01 MED ORDER — IOHEXOL 350 MG/ML SOLN
100.0000 mL | Freq: Once | INTRAVENOUS | Status: AC | PRN
  Administered 2021-04-01: 100 mL via INTRAVENOUS

## 2021-04-01 MED ORDER — ALTEPLASE (STROKE) FULL DOSE INFUSION
0.9000 mg/kg | Freq: Once | INTRAVENOUS | Status: AC
  Administered 2021-04-01: 67.1 mg via INTRAVENOUS
  Filled 2021-04-01: qty 100

## 2021-04-01 MED ORDER — SODIUM CHLORIDE 0.9 % IV SOLN: 50.0000 mL | Freq: Once | INTRAVENOUS | Status: DC

## 2021-04-01 NOTE — Consult Note (Signed)
[facility] Tele[facility]urology Consult Services   Date of Service:   04/01/2021 20:44:54  Diagnosis:       I63.9 - Cerebrovascular accident (CVA), unspecified mechanism (HCC)  Impression:      75yo woman w/PMH of HTN, HLD, hypothyroidism, dementia p/w AMS, left facial droop, slurred speech on 04/01/21. Her daughter at bedside states she was normal until 19:30 when she laid down and then had trouble breathing so they got her back up and then she became unresponsive. On arrival to the ED, left facial droop and slurred speech was noted so stroke alert was called. She had a similar episode on 03/27/21 lasting 20 minutes and then she returned to baseline. Her PMD stopped aspirin 3 months ago because it "wasn't needed." She has chronic chest pain for many years, no recent new complaints. She currently says "i don't know" regarding any current complaints. She sometimes needs a walker at baseline. NIHSS 3 for disorientation and new left facial asymmetry (confirmed with family). CTH has no acute findings. The patient's facial weakness is new, acute, mild but disabling. Considering the possible disabling effect of this deficit on daily living, the benefits of thrombolytics outweigh the risks in this setting of a low NIHSS in accordance with AHA stroke guidelines. After no contraindications were identified, I discussed the indication, side effects and risk/benefit of thrombolytics compared to antiplatelet therapy with the patient's family who accepted treatment with alteplase. CTA Head/Neck shows no large vessel occlusion. Presentation is concerning for acute small vessel ischemic stroke based on history and exam, stroke workup with post thrombolytics protocol advised.    Metrics: Last Known Well: 04/01/2021 19:30:00 [facility] Notification Time: 04/01/2021 20:44:54 Arrival Time: 04/01/2021 20:26:00 Stamp Time: 04/01/2021 20:44:54 Initial Response Time: 04/01/2021 20:46:31 Symptoms: AMS,  left facial droop, slurred speech. NIHSS Start Assessment Time: 04/01/2021 20:51:20 Patient is a candidate for Thrombolytic. Thrombolytic Medical Decision: 04/01/2021 21:06:29 Needle Time: 04/01/2021 21:24:21 Weight Noted by Staff: 74.6 kg  CT head showed no acute hemorrhage or acute core infarct.  Radiologist was called back for review of advanced imaging on 04/01/2021 21:16:25 ED Physician notified of diagnostic impression and management plan on 04/01/2021 21:25:52  Advanced Imaging: CTA Head and Neck Completed.  LVO:No  Patient doesn't meet criteria for emergent NIR consideration   Thrombolytic Contraindications:  Last Known Well > 4.5 hours: No CT Head showing hemorrhage: No Ischemic stroke within 3 months: No Severe head trauma within 3 months: No Intracranial/intraspinal surgery within 3 months: No History of intracranial hemorrhage: No Symptoms and signs consistent with an SAH: No GI malignancy or GI bleed within 21 days: No Coagulopathy: Platelets <100 000 /mm3, INR >1.7, aPTT>40 s, or PT >15 s: No Treatment dose of LMWH within the previous 24 hrs: No Use of NOACs in past 48 hours: No Glycoprotein IIb/IIIa receptor inhibitors use: No Symptoms consistent with infective endocarditis: No Suspected aortic arch dissection: No Intra-axial intracranial neoplasm: No  Thrombolytic Decision and Management Plan: Management with thrombolytic treatment was explained to the Family as was risks and benefits and alternatives to the treatment. Patient agrees with the decision to proceed with thrombolytic treatment. . All questions were answered and the Family expressed understanding of the treatment plan.  Our recommendations are outlined below.  Recommendations: IV Alteplase recommended.  Thrombolytic bolus given Without Complication.   IV Alteplase/Activase Total Dose - 67.1 mg IV Alteplase/Activase Bolus Dose - 6.7 mg IV Alteplase/Activase Infusion Dose - 60.4  mg   Routine post Thrombolytic monitoring including neuro checks and  blood pressure control during/after treatment Monitor blood pressure Check blood pressure and neuro assessment every 15 min for 2 h, then every 30 min for 6 h, and finally every hour for 16 h.  Manage Blood Pressure per post Thrombolytic protocol.        Admission to ICU       CT brain 24 hours post Thrombolytic       NPO until swallowing screen performed and passed       No antiplatelet agents or anticoagulants (including heparin for DVT prophylaxis) in first 24 hours       No Foley catheter, nasogastric tube, arterial catheter or central venous catheter for 24 hr, unless absolutely necessary       Telemetry       Bedside swallow evaluation       HOB less than 30 degrees       Euglycemia       Avoid hyperthermia, PRN acetaminophen       DVT prophylaxis       Inpatient Neurology Consultation       Stroke evaluation as per inpatient neurology recommendations  Discussed with ED physician    ------------------------------------------------------------------------------  History of Present Illness: Patient is a 75 year old Female.  Patient was brought by EMS for symptoms of AMS, left facial droop, slurred speech.  75yo woman w/PMH of HTN, HLD, hypothyroidism, dementia p/w AMS, left facial droop, slurred speech on 04/01/21. Her daughter at bedside states she was normal until 19:30 when she laid down and then had trouble breathing so they got her back up and then she became unresponsive. On arrival to the ED, left facial droop and slurred speech was noted so stroke alert was called. She had a similar episode on 03/27/21 lasting 20 minutes and then she returned to baseline. Her PMD stopped aspirin 3 months ago because it "wasn't needed." She has chronic chest pain for many years, no recent new complaints. She currently says "i don't know" regarding any current complaints. She sometimes needs a walker at baseline.   Past  Medical History:      see hpi  Social History: Drug Use: No  Family History:Unable to obtain due to Patient Status  ROS: unable to assess  Anticoagulant use:  No  Antiplatelet use: No  Allergies:  Reviewed   Examination: BP(145/67), Pulse(54), Blood Glucose(112) 1A: Level of Consciousness - Alert; keenly responsive + 0 1B: Ask Month and Age - Could Not Answer Either Question Correctly + 2 1C: Blink Eyes & Squeeze Hands - Performs Both Tasks + 0 2: Test Horizontal Extraocular Movements - Normal + 0 3: Test Visual Fields - No Visual Loss + 0 4: Test Facial Palsy (Use Grimace if Obtunded) - Minor paralysis (flat nasolabial fold, smile asymmetry) + 1 5A: Test Left Arm Motor Drift - No Drift for 10 Seconds + 0 5B: Test Right Arm Motor Drift - No Drift for 10 Seconds + 0 6A: Test Left Leg Motor Drift - No Drift for 5 Seconds + 0 6B: Test Right Leg Motor Drift - No Drift for 5 Seconds + 0 7: Test Limb Ataxia (FNF/Heel-Shin) - No Ataxia + 0 8: Test Sensation - Normal; No sensory loss + 0 9: Test Language/Aphasia - Normal; No aphasia + 0 10: Test Dysarthria - Normal + 0 11: Test Extinction/Inattention - No abnormality + 0  NIHSS Score: 3  Pre-Morbid Modified Rankin Scale: 3 Points = Moderate disability; requiring some help, but able to walk without  assistance   Patient/Family was informed the Neurology Consult would occur via TeleHealth consult by way of interactive audio and video telecommunications and consented to receiving care in this manner.   Patient is being evaluated for possible acute neurologic impairment and high probability of imminent or life-threatening deterioration. I spent total of 45 minutes providing care to this patient, including time for face to face visit via telemedicine, review of medical records, imaging studies and discussion of findings with providers, the patient and/or family.   Dr Carson Myrtle Nataliah Hatlestad   [facility] [phone]  Case  [id number]

## 2021-04-01 NOTE — ED Triage Notes (Signed)
Pt brought in by EMS as a code stroke after family called out for unresponsive pt. Per EMS, family stated that pt was unresponsive for "a couple of mins. And unable to follow any commands. EMS stated that pt had slurred speech and L sided facial drooping upon their arrival. LKW per family was 88. Dr Hyacinth Meeker at bedside upon pt arrival and official Code Stroke was activated.

## 2021-04-01 NOTE — H&P (Addendum)
Neurology H&P Reason for Admission: Stroke-like episode s/p tPA administered at [facility] by [facility]  CC: Left facial droop and slowness to respond  History is obtained from: Daughter Lori Bell, ED provider, chart review  HPI: Lori Bell is a 75 y.o. female with a past medical history significant for Alzheimer's dementia with behavioral disturbance (FAST 5), hypertension, hyperlipidemia, coronary artery disease, GERD, depression, osteoporosis, ischemic colitis, GI bleed (2018) hypothyroidism  Today the patient seemed in her usual state of health. Son was putting patient to bed at 7:30 PM, at 7:25 PM but when she was laid down, she had chest pain and labored breathing.  They therefore called daughter who lives next door and is a CNA to help. They got her up and she went limp, leaning to the left and she was completely unresponsive.  Subsequently they carried her to the living room and sat her in a chair (did not want a lay her down due to concern for her breathing) and 20 minutes later she started coming around.  At that point they noticed left face and eye drooping and speech was low/whispering but not necessarily slurred.   Monday she had a 23 minute episode that was similar, but erect, not limp (just not responsive to talking, touching or rubbing her face). This started in November.  Family notes the patient is always short of breath with these episodes, and to date she has had 5-6 episodes total, lasting anywhere from 10-30 minutes.  After their second ED visit for this in November, daughter notes she was told she didn't need to bring mother in every time she had an episode so has managed them at home since.  On review of systems, daughter also notes that the patient has had a tremor in her lower lip for the last 3 to 4 months.  The patient also has concern for CHF, taking increasing doses of lasix lately (stared on 20 mg daily, daughter giving 40 mg).  Daughter notes patient has  been having swelling in her hands and face.  Additionally her appetite is poor and she does not really eat any meals, just cookies and bananas.  Daughter reports there are some days that the patient does not eat at all, though she will typically drink about half a gallon of water mixed with peach flavored tea.  Due to arthritis in her hands the patient also takes Tylenol 2-3 times weekly.  At baseline she reports her mother has urinary incontinence daily in her clothes and sometimes bowel incontinence, forgets where the bathroom is, pees at the back door, has wet the bed 3 times in the last 4 months.  Patient requires 24/7 supervision since Christmas. Refuses baths most of the time, requires help with bathing and dressing (sometimes underwear on head for example).  Per palliative care note 03/01/2021: "Patient seen today for follow up on behavior concern related to her dementia. She is currently on Zyprexa 2.5mg  in the mornings and 5mg  at bedtime. Takes Xanax 0.25mg  daily as needed. Spoke with her daughter on phone, she report mild improvement in behavior, report patient still has difficulty staying still, report patient wanders a lot within her home, report symptoms affects her sleep during the night. No report of falls, ambulates independently without assistive device. Report good appetite, no report of weight loss. Report occasional incontinence episodes."  "CODE STATUS: DNR Patient's goal of care is comfort. Signed DNR and MOST forms present in home, copy on [place] EMR. Details  of MOST form include limited additional intervention, antibiotics if indicated, IV fluids for a defined trial period, no feeding tube."  Regarding her agitation associated with her Alzheimer's disease, her aggressive behavior improved on 2.5 mg Zyprexa in the morning and 5 mg nightly, with support of melatonin for sleep.  She is also receiving Xanax twice daily on weekdays (she takes 1/2 of 0.5 mg tablet BID) Per primary  care notes from June 2022 she is having some dysphagia especially with harder to chew foods such as meat, not eating as much, and some pills that she was having difficulty swallowing (fish oil, calcium) were stopped.  She also uses CBD Gummies and does have some hallucinations at baseline. Aspirin was stopped at this televisit for reasons unclear to the daughter.   LKW: 19:25 PM tPA given?:  Yes 8/13 21:29 at [facility] arrival to [facility] I was notified that tPA was stopped early secondary to oral bleeding which has since subsided IA performed?: No,  Premorbid modified rankin scale:     3 - Moderate disability. Requires some help, but able to walk unassisted. NIHSS 3 for disorientation and new left facial asymmetry (confirmed with family) on [facility] eval   ROS: Unable to obtain due to altered mental status, obtained from family to the best of my ability as above.   Past Medical History:  Diagnosis Date   Acute (reversible) ischemia of large intestine, extent unspecified (HCC)    Acute ischemic colitis (HCC)    Anxiety disorder, unspecified    ASCVD (arteriosclerotic cardiovascular disease)    Depression    Essential (primary) hypertension    Gastrointestinal hemorrhage, unspecified    GERD (gastroesophageal reflux disease)    GI bleeding 06/10/2017   Hyperlipidemia    Hypertension    Hypothyroidism    Hypothyroidism, unspecified    Major depressive disorder, single episode, unspecified    Mixed hyperlipidemia    Unspecified dementia without behavioral disturbance (HCC)    Upper abdominal pain, unspecified    Scheduled Meds: Continuous Infusions:  alteplase 60.39 mL/hr at 04/01/21 2129   Followed by   sodium chloride      Current Facility-Administered Medications:    alteplase (ACTIVASE) 1 mg/mL infusion SOLN 67.1 mg, 0.9 mg/kg, Intravenous, Once, Last Rate: 60.39 mL/hr at 04/01/21 2129, Rate Change at 04/01/21 2129 **FOLLOWED BY** 0.9 %  sodium  chloride infusion, 50 mL, Intravenous, Once, Corti, Kandy Garrison, MD  Current Outpatient Medications --not yet verified by pharmacy   alendronate (FOSAMAX) 70 MG tablet, TAKE 1 TABLET BY MOUTH ONCE WEEKLY., Disp: 12 tablet, Rfl: 0   ALPRAZolam (XANAX) 0.5 MG tablet, Take 1 tablet (0.5 mg total) by mouth 2 (two) times daily as needed for anxiety., Disp: 60 tablet, Rfl: 2  Per PMD notes, taking one quarter tab in the morning and half tablet at night  Per Daughter takes 1/2 in the morning and 1/2 in the night now   Docusate Calcium (STOOL SOFTENER PO), Take by mouth in the morning and at bedtime., Disp: , Rfl:    DULoxetine (CYMBALTA) 60 MG capsule, TAKE ONE CAPSULE BY MOUTH ONCE DAILY., Disp: 90 capsule, Rfl: 0   famotidine (PEPCID) 20 MG tablet, TAKE (1) TABLET BY MOUTH TWICE DAILY., Disp: 60 tablet, Rfl: 3   fluticasone (FLONASE) 50 MCG/ACT nasal spray, Place 2 sprays into both nostrils daily., Disp: 9.9 g, Rfl: 0   furosemide (LASIX) 20 MG tablet, Take 1 tablet (20 mg total) by mouth daily as needed  for edema., Disp: 90 tablet, Rfl: 1  Daughter reports giving 40 mg daily -- helps with leg and face swelling   irbesartan (AVAPRO) 150 MG tablet, Take 1 tablet (150 mg total) by mouth daily., Disp: 90 tablet, Rfl: 1   levothyroxine (SYNTHROID) 25 MCG tablet, TAKE ONE TABLET BY MOUTH ONCE DAILY BEFORE BREAKFAST., Disp: 90 tablet, Rfl: 0   Melatonin 10 MG CAPS, Take 10 mg by mouth at bedtime as needed., Disp: 30 capsule, Rfl:    nitroGLYCERIN (NITROSTAT) 0.4 MG SL tablet, Place 1 tablet (0.4 mg total) under the tongue every 5 (five) minutes as needed for chest pain., Disp: 30 tablet, Rfl: 0   OLANZapine (ZYPREXA) 5 MG tablet, Take 2.5mg  in morning and 5mg  at bedtime, Disp: 135 tablet, Rfl: 1   pantoprazole (PROTONIX) 40 MG tablet, TAKE ONE TABLET BY MOUTH ONCE DAILY., Disp: 90 tablet, Rfl: 1   pravastatin (PRAVACHOL) 40 MG tablet, TAKE ONE TABLET BY MOUTH ONCE DAILY., Disp: 90 tablet, Rfl: 0   propranolol  ER (INDERAL LA) 80 MG 24 hr capsule, TAKE (1) CAPSULE BY MOUTH AT BEDTIME., Disp: 90 capsule, Rfl: 0  Family History  Problem Relation Age of Onset   Parkinsonism Father    Coronary artery disease Other    Cancer Brother    Colon cancer Brother 70       deceased    Dementia Brother    Dementia Brother    Dementia Sister     Social History:  reports that she has never smoked. She has never used smokeless tobacco. She reports that she does not drink alcohol and does not use drugs.   Exam: Current vital signs: BP 137/70   Pulse (!) 57   Resp (!) 22   Ht 5\' 5"  (1.651 m)   Wt 74.6 kg   SpO2 92%   BMI 27.37 kg/m  Vital signs in last 24 hours: Temp:  [97.9 F (36.6 C)-98.1 F (36.7 C)] 98.1 F (36.7 C) (08/13 2219) Pulse Rate:  [54-62] 57 (08/14 0100) Resp:  [12-22] 18 (08/14 0100) BP: (113-152)/(55-83) 123/83 (08/14 0100) SpO2:  [90 %-100 %] 96 % (08/14 0100) Weight:  [74.6 kg] 74.6 kg (08/13 2046)   Physical Exam  Constitutional: Appears well-developed and well-nourished.  Psych: Affect appropriate to situation,  Eyes: No scleral injection HENT: No oropharyngeal obstruction.  MSK: no joint deformities.  Cardiovascular: Normal rate and regular rhythm.  Respiratory: Effort normal, non-labored breathing GI: Soft.  No distension. There is no tenderness.  Skin: Warm dry and intact visible skin  Neuro: Mental Status: Patient is awake, alert, oriented to person only Patient is able to identify her daughter at the bedside but otherwise gives very limited history Mild difficulty with complex repetition and naming and low-frequency objects Cranial Nerves: II: Visual Fields are full. Pupils are equal, round, and reactive to light.  III,IV, VI: EOMI with mild left eye ptosis.  Saccadic pursuits V: Facial sensation is symmetric to temperature VII: Facial movement is notable for subtle left facial droop.  VIII: hearing is intact to voice X: Uvula elevates symmetrically XI:  Shoulder shrug is symmetric. XII: tongue is midline without atrophy or fasciculations.  Motor: Tone is normal. Bulk is normal.  Confrontational strength testing is limited secondary to her dementia.  There is mild symmetric pronation of the upper extremities without drift.  There is no drift of the bilateral lower extremities.  However she is slower on the left side with finger-to-nose and heel-to-shin testing Sensory:  Grossly symmetric to light noxious stimulation (tickle).  Does not reliably report left and right Deep Tendon Reflexes: 3+ and symmetric in the biceps and patellae.  Cerebellar: FNF and HKS are intact bilaterally  NIHSS total 4 Score breakdown: 2 points for not stating month and age correctly, one-point for left facial droop, one-point for mild aphasia  I have reviewed labs in epic and the results pertinent to this consultation are: Glucose 112 CMP notable for potassium of 2.7, creatinine 1.29 (GFR 43), baseline creatinine 1 (GFR 55)  I have reviewed the images obtained:  CT head code stroke personally reviewed Agree with radiology read, no acute intracranial infarct or other abnormality within aspects of 10.  There is significant atrophy and sufficient chronic small vessel ischemic disease to obscure potential acute infarct Full radiology impression: 1. No acute intracranial infarct or other abnormality. 2. ASPECTS is 10. 3. Age-related cerebral atrophy with mild chronic small vessel ischemic disease. 4. Chronic left maxillary and sphenoid sinusitis. 5. Left mastoid effusion, also chronic in appearance.  CTA personally reviewed, negative for large vessel occlusion with fairly mild atheromatous changes throughout, no clinically significant stenoses Full radiology impression: 1. Negative CTA for emergent large vessel occlusion. 2. Mild for age atheromatous change about the carotid bifurcations without hemodynamically significant stenosis. 3. Moderate approximate 50%  stenoses at the origins of both vertebral arteries. Otherwise wide patency of the posterior circulation. 4. Diffuse tortuosity of the major arterial vasculature of the head and neck, suggesting chronic underlying hypertension. 5. Diffuse interlobular septal thickening with ground-glass opacity within the visualized lungs, suggesting a degree of pulmonary interstitial edema.  Assessment:  75 year old woman with a past medical history significant for Altheimer's dementia, hypertension, hyperlipidemia, coronary artery disease, presenting with an episode of shortness of breath followed by loss of consciousness and left-sided facial droop with slowness to respond.  Differential includes stroke/TIA, seizure, and patient is status post tPA given at [facility]  CNS Strokelike episode s/p tPA Current suspected etiology: Small vessel disease versus seizure episode versus delirium/cardiogenic episode -Admit to: ICU, patient unlikely to be an OPTIMIST trial candidate as she will not arrive within 2 hours of tPA administration -Statin for goal LDL < 70, Home medication is pravastatin 40 mg but this may need to be adjusted -Hold antiplatelets until 24 hour post tPA neuroimaging is stable and without evidence of bleeding -Blood pressure control, goal of SYS < 180/105 post tPA.  Eventually transition towards home medication regimen of propanolol 80 mg nightly and irbesartan 150 mg daily -MRI/ECHO/A1C/Lipid panel. -Hyperglycemia management per SSI to maintain glucose 140-180mg /dL. -PT/OT/ST therapies and recommendations when able -Routine EEG   Alzheimer's dementia with behavioral disturbance -Given significant risk of hospital delirium and agitated behavior even at home, will continue her regular medications -Continue home Xanax 0.5 mg twice daily, home olanzapine 2.5 in the morning and 5 mg in the evening, duloxetine 60 mg daily  RESP No active issues  CV Essential (primary)  hypertension -Aggressive BP control, goal SBP < 185/105 -Hold oral agents for now (holding home medications of irbesartan 150 mg daily and propanolol ER 80 mg nightly) Hyperlipidemia, unspecified  - Statin for goal LDL < 70  Concern for congestive heart failure -Given AKI hold home furosemide 20 to 40 mg daily for now -Monitor volume status and restart as needed  HEME S/p tPA  -Monitor -transfuse for hgb < 7  ENDO No known diabetes diagnosis currently -Low dose SSI -goal HgbA1c < 7 Lab Results  Component  Value Date   HGBA1C 5.7 03/20/2017    Hypothyroidism Continue home levothyroxine (SYNTHROID) 25 MCG   GI/GU Acute Kidney Injury -Gentle hydration -Avoid nephrotoxic agents  Fluid/Electrolyte Disorders Hypo Potassium  Magnesium, phosphorus not yet checked -Repeat labs -Trend -Replete to normal level as needed  ID No active issues  Nutrition E66.9 Obesity BMI 27.37  Prophylaxis DVT: SCDs GI: Protonix Bowel: Senna  Diet: NPO until cleared by speech  Code Status: DNR   THE FOLLOWING WERE PRESENT ON ADMISSION: CNS -concern for acute Ischemic Stroke, dementia Renal - hypokalemia, AKI Heme-  Coagulopathy secondary to tPA at outside hospital, Anemia Palliative care DNI/DNR  Brooke DareSrishti Blaize Epple MD-PhD [facility] [phone] Available 7 PM to 7 AM, outside of these hours please call Neurologist on call as listed on Amion.   Total critical care time: 65 minutes   Critical care time was exclusive of separately billable procedures and treating other patients.   Critical care was necessary to treat or prevent imminent or life-threatening deterioration.   Critical care was time spent personally by me on the following activities: development of treatment plan with patient and/or surrogate as well as nursing, discussions with consultants/primary team, evaluation of patient's response to treatment, examination of patient, obtaining history from patient or  surrogate, ordering and performing treatments and interventions, ordering and review of laboratory studies, ordering and review of radiographic studies, and re-evaluation of patient's condition as needed, as documented above.

## 2021-04-01 NOTE — ED Notes (Signed)
CODE STROKE PAGED [id number]

## 2021-04-01 NOTE — ED Provider Notes (Signed)
[facility]ENCY [facility] Provider Note   CSN: [id number] Arrival date & time: 04/01/21  2026     History Chief Complaint  Patient presents with   Code Stroke    Lori Bell is a 75 y.o. female.  HPI  This patient is a 75 year old female, she has a history of prior transient ischemic attacks, history of ischemic colitis, history of essential hypertension hyperlipidemia hypothyroidism and takes medications for these things.  According to the paramedics the patient was last seen normal at 7:30 PM, she was sent to the hospital today when the family noticed that she was having difficulty moving her left side and had left-sided facial droop, she had acute change in her mental status and was actually unresponsive requiring stimulus to wake her up.  The patient has been awake for the paramedics but seems to be weak, speaks very softly and slowly which is very unusual and according to the family she is usually awake up walking around and very bright and interactive.  She has been diagnosed with dementia due to Alzheimer's disease  Level 5 caveat applies secondary to some confusion and mild encephalopathy.  Code stroke was activated prehospital by  Past Medical History:  Diagnosis Date   Acute (reversible) ischemia of large intestine, extent unspecified (HCC)    Acute ischemic colitis (HCC)    Anxiety disorder, unspecified    ASCVD (arteriosclerotic cardiovascular disease)    Depression    Essential (primary) hypertension    Gastrointestinal hemorrhage, unspecified    GERD (gastroesophageal reflux disease)    GI bleeding 06/10/2017   Hyperlipidemia    Hypertension    Hypothyroidism    Hypothyroidism, unspecified    Major depressive disorder, single episode, unspecified    Mixed hyperlipidemia    Unspecified dementia without behavioral disturbance (HCC)    Upper abdominal pain, unspecified     Patient Active Problem List   Diagnosis Date Noted   Osteoporosis 05/02/2020    Anxiety disorder, unspecified    Major depressive disorder, single episode, unspecified    Mixed hyperlipidemia    Acute (reversible) ischemia of large intestine, extent unspecified (HCC)    Essential (primary) hypertension    Hypothyroidism, unspecified    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 Ewing SchleinMagod: internal and external hemorrhoids, a few tin hyperplastic-appearing polyps not biopsed   COLONOSCOPY WITH PROPOFOL N/A 06/11/2017   Procedure: COLONOSCOPY WITH PROPOFOL;  Surgeon: West BaliFields, Sandi L, MD;  Location: [facility];  Service: Endoscopy;  Laterality: N/A;   INNER EAR SURGERY  1966   Right   TOTAL ABDOMINAL HYSTERECTOMY W/ BILATERAL SALPINGOOPHORECTOMY  05/2009   TUBAL LIGATION  1970     OB History   No obstetric history on file.     Family History  Problem Relation Age of Onset   Parkinsonism Father    Coronary artery disease Other    Cancer Brother    Colon cancer Brother [id number]       deceased    Dementia Brother    Dementia Brother  Dementia Sister     Social History   Tobacco Use   Smoking status: Never   Smokeless tobacco: Never  Vaping Use   Vaping Use: Never used  Substance Use Topics   Alcohol use: No    Alcohol/week: 0.0 standard drinks   Drug use: No    Home Medications Prior to Admission medications   Medication Sig Start Date End Date Taking? Authorizing Provider  alendronate (FOSAMAX) 70 MG tablet TAKE 1 TABLET BY MOUTH ONCE WEEKLY. 02/09/21   Wynn Banker, MD  ALPRAZolam (XANAX) 0.5 MG tablet Take 1 tablet  (0.5 mg total) by mouth 2 (two) times daily as needed for anxiety. 12/30/20   Wynn Banker, MD  Docusate Calcium (STOOL SOFTENER PO) Take by mouth in the morning and at bedtime.    [provider]  DULoxetine (CYMBALTA) 60 MG capsule TAKE ONE CAPSULE BY MOUTH ONCE DAILY. 10/25/20   Koberlein, Paris Lore, MD  famotidine (PEPCID) 20 MG tablet TAKE (1) TABLET BY MOUTH TWICE DAILY. 03/09/21   Koberlein, Paris Lore, MD  fluticasone (FLONASE) 50 MCG/ACT nasal spray Place 2 sprays into both nostrils daily. 12/20/20   Muthersbaugh, Dahlia Client, PA-C  furosemide (LASIX) 20 MG tablet Take 1 tablet (20 mg total) by mouth daily as needed for edema. 11/02/20   Koberlein, Paris Lore, MD  irbesartan (AVAPRO) 150 MG tablet Take 1 tablet (150 mg total) by mouth daily. 02/01/21   Wynn Banker, MD  levothyroxine (SYNTHROID) 25 MCG tablet TAKE ONE TABLET BY MOUTH ONCE DAILY BEFORE BREAKFAST. 01/26/21   Wynn Banker, MD  Melatonin 10 MG CAPS Take 10 mg by mouth at bedtime as needed. 08/17/20   Wynn Banker, MD  nitroGLYCERIN (NITROSTAT) 0.4 MG SL tablet Place 1 tablet (0.4 mg total) under the tongue every 5 (five) minutes as needed for chest pain. 07/02/20   Dione Booze, MD  OLANZapine (ZYPREXA) 5 MG tablet Take 2.5mg  in morning and 5mg  at bedtime 02/10/21   Koberlein, 02/12/21, MD  pantoprazole (PROTONIX) 40 MG tablet TAKE ONE TABLET BY MOUTH ONCE DAILY. 06/24/20   Koberlein, 13/5/21, MD  pravastatin (PRAVACHOL) 40 MG tablet TAKE ONE TABLET BY MOUTH ONCE DAILY. 01/26/21   Koberlein, 03/28/21, MD  propranolol ER (INDERAL LA) 80 MG 24 hr capsule TAKE (1) CAPSULE BY MOUTH AT BEDTIME. 03/28/21   Koberlein, Junell C, MD  QUEtiapine (SEROQUEL) 25 MG tablet Take 1 tablet (25 mg total) by mouth at bedtime. 06/17/20 07/13/20  07/15/20, MD    Allergies    Percocet [oxycodone-acetaminophen], Seroquel [quetiapine fumarate], and Simvastatin  Review of Systems   Review of Systems  Unable to perform ROS:  Dementia   Physical Exam Updated Vital Signs BP 137/70   Pulse (!) 57   Resp (!) 22   Ht 1.651 m (5\' 5" )   Wt 74.6 kg   SpO2 92%   BMI 27.37 kg/m   Physical Exam Vitals and nursing note reviewed.  Constitutional:      General: She is not in acute distress.    Appearance: She is well-developed. She is ill-appearing.  HENT:     Head: Normocephalic and atraumatic.     Mouth/Throat:     Pharynx: No oropharyngeal exudate.  Eyes:     General: No scleral icterus.       Right eye: No discharge.        Left eye: No discharge.     Conjunctiva/sclera: Conjunctivae normal.  Pupils: Pupils are equal, round, and reactive to light.  Neck:     Thyroid: No thyromegaly.     Vascular: No JVD.  Cardiovascular:     Rate and Rhythm: Normal rate and regular rhythm.     Heart sounds: Normal heart sounds. No murmur heard.   No friction rub. No gallop.  Pulmonary:     Effort: Pulmonary effort is normal. No respiratory distress.     Breath sounds: Normal breath sounds. No wheezing or rales.  Abdominal:     General: Bowel sounds are normal. There is no distension.     Palpations: Abdomen is soft. There is no mass.     Tenderness: There is no abdominal tenderness.  Musculoskeletal:        General: No tenderness. Normal range of motion.     Cervical back: Normal range of motion and neck supple.  Lymphadenopathy:     Cervical: No cervical adenopathy.  Skin:    General: Skin is warm and dry.     Findings: No erythema or rash.  Neurological:     Mental Status: She is alert.     Coordination: Coordination normal.     Comments: The patient is able to lift both hands, equal grips, is able to lift both legs, seems to be generally weak, she has a mild quiver of her lip and her lower eyelids, she has a left-sided facial droop, her speech is very quiet very soft and very slow.  Her peripheral visual fields seem normal and she is able to move eyes from side to side.  Cranial nerves III through XII are  otherwise normal.  She is able to repeat after me when I say no ifs, ands or buts with only a very slight changing of the phrase, when she smiles she has left-sided facial droop  Psychiatric:        Behavior: Behavior normal.    ED Results / Procedures / Treatments   Labs (all labs ordered are listed, but only abnormal results are displayed) Labs Reviewed  CBC - Abnormal; Notable for the following components:      Result Value   RBC 3.39 (*)    Hemoglobin 10.4 (*)    HCT 31.8 (*)    All other components within normal limits  COMPREHENSIVE METABOLIC PANEL - Abnormal; Notable for the following components:   Potassium 2.7 (*)    Glucose, Bld 116 (*)    Creatinine, Ser 1.29 (*)    Calcium 8.8 (*)    Albumin 3.4 (*)    AST 12 (*)    GFR, Estimated 43 (*)    All other components within normal limits  CBG MONITORING, ED - Abnormal; Notable for the following components:   Glucose-Capillary 112 (*)    All other components within normal limits  RESP PANEL BY RT-PCR (FLU A&B, COVID) ARPGX2  ETHANOL  PROTIME-INR  APTT  DIFFERENTIAL  RAPID URINE DRUG SCREEN, HOSP PERFORMED  URINALYSIS, ROUTINE W REFLEX MICROSCOPIC  I-STAT CHEM 8, ED    EKG EKG Interpretation  Date/Time:  Saturday April 01 2021 21:05:58 EDT Ventricular Rate:  56 PR Interval:  219 QRS Duration: 113 QT Interval:  466 QTC Calculation: 450 R Axis:   -35 Text Interpretation: Sinus rhythm Borderline IVCD with LAD Low voltage, precordial leads Borderline T abnormalities, anterior leads Confirmed by Eber Hong ([id number]) on 04/01/2021 9:35:44 PM  Radiology CT ANGIO HEAD NECK W WO CM  Result Date: 04/01/2021 CLINICAL DATA:  Initial evaluation for  neuro deficit, stroke suspected. EXAM: CT ANGIOGRAPHY HEAD AND NECK TECHNIQUE: Multidetector CT imaging of the head and neck was performed using the standard protocol during bolus administration of intravenous contrast. Multiplanar CT image reconstructions and MIPs were obtained  to evaluate the vascular anatomy. Carotid stenosis measurements (when applicable) are obtained utilizing NASCET criteria, using the distal internal carotid diameter as the denominator. CONTRAST:  OMNIPAQUE IOHEXOL 350 MG/ML SOLN COMPARISON:  Head CT from earlier the same day. FINDINGS: CTA NECK FINDINGS Aortic arch: Visualized aortic arch normal in caliber with normal branch pattern. Mild atheromatous change about the arch and origin of the great vessels without hemodynamically significant stenosis. Right carotid system: Right CCA tortuous proximally but is widely patent to the bifurcation. Mild atheromatous change about the right carotid bulb without stenosis. Right ICA patent distally without stenosis, dissection or occlusion. Left carotid system: Left CCA tortuous proximally but is widely patent to the bifurcation without stenosis. Mild for age atheromatous change about the left carotid bulb without stenosis. Left ICA patent distally without stenosis, dissection or occlusion. Vertebral arteries: Both vertebral arteries arise from the subclavian arteries. No proximal subclavian artery stenosis. Atheromatous change at the origins of both vertebral arteries with associated moderate approximate 50% ostial stenoses. Vertebral artery tortuous proximally but are otherwise widely patent without stenosis, dissection or occlusion. Skeleton: No visible acute osseous finding. No discrete or worrisome osseous lesions. Degenerative changes noted about the left TMJ. Other neck: No other acute soft tissue abnormality within the neck. No mass or adenopathy. Upper chest: Scattered interlobular septal thickening with ground-glass opacity within the visualized lungs, suggesting a degree of pulmonary interstitial edema. Review of the MIP images confirms the above findings CTA HEAD FINDINGS Anterior circulation: Both internal carotid arteries widely patent to the termini without stenosis. A1 segments widely patent. Normal  anterior communicating artery complex. Anterior cerebral arteries patent to their distal aspects without stenosis. No M1 stenosis or occlusion. Normal MCA bifurcations. No proximal MCA branch occlusion. Distal MCA branches well perfused and symmetric. Posterior circulation: Both V4 segments patent to the vertebrobasilar junction without stenosis. Both PICA origins patent and normal. Basilar widely patent to its distal aspect without stenosis. Superior cerebellar arteries patent bilaterally. Both PCAs primarily supplied via the basilar and are well perfused to there distal aspects. Venous sinuses: Grossly patent allowing for timing the contrast bolus. Anatomic variants: None significant.  No aneurysm. Review of the MIP images confirms the above findings IMPRESSION: 1. Negative CTA for emergent large vessel occlusion. 2. Mild for age atheromatous change about the carotid bifurcations without hemodynamically significant stenosis. 3. Moderate approximate 50% stenoses at the origins of both vertebral arteries. Otherwise wide patency of the posterior circulation. 4. Diffuse tortuosity of the major arterial vasculature of the head and neck, suggesting chronic underlying hypertension. 5. Diffuse interlobular septal thickening with ground-glass opacity within the visualized lungs, suggesting a degree of pulmonary interstitial edema. Critical Value/emergent results were called by telephone at the time of interpretation on 04/01/2021 at 8:48 pm to provider [facility] , who verbally acknowledged these results. Electronically Signed   By: Rise Mu M.D.   On: 04/01/2021 21:10   CT HEAD CODE STROKE WO CONTRAST  Result Date: 04/01/2021 CLINICAL DATA:  Code stroke. Initial evaluation for acute stroke, left facial droop. EXAM: CT HEAD WITHOUT CONTRAST TECHNIQUE: Contiguous axial images were obtained from the base of the skull through the vertex without intravenous contrast. COMPARISON:  CT from 10/08/2020. FINDINGS:  Brain: Generalized age-related cerebral atrophy with  chronic small vessel ischemic disease. No acute intracranial hemorrhage. No acute large vessel territory infarct. No mass lesion, midline shift or mass effect. No hydrocephalus or extra-axial fluid collection. Vascular: No hyperdense vessel. Scattered vascular calcifications noted within the carotid siphons. Skull: Scalp soft tissues and calvarium within normal limits. Sinuses/Orbits: Globes and orbital soft tissues within normal limits. Chronic left sphenoid and maxillary sinusitis. Right sphenoid sinus retention cyst. Chronic appearing left mastoid effusion. Negative nasopharynx. Other: None. ASPECTS [facility] Stroke Program Early CT Score) - Ganglionic level infarction (caudate, lentiform nuclei, internal capsule, insula, M1-M3 cortex): 7 - Supraganglionic infarction (M4-M6 cortex): 3 Total score (0-10 with 10 being normal): 10 IMPRESSION: 1. No acute intracranial infarct or other abnormality. 2. ASPECTS is 10. 3. Age-related cerebral atrophy with mild chronic small vessel ischemic disease. 4. Chronic left maxillary and sphenoid sinusitis. 5. Left mastoid effusion, also chronic in appearance. Critical Value/emergent results were called by telephone at the time of interpretation on 04/01/2021 at 8:48 pm to provider [facility] , who verbally acknowledged these results. Electronically Signed   By: Rise Mu M.D.   On: 04/01/2021 20:54    Procedures .Critical Care  Date/Time: 04/01/2021 9:34 PM Performed by: Eber Hong, MD Authorized by: Eber Hong, MD   Critical care provider statement:    Critical care time (minutes):  35   Critical care time was exclusive of:  Separately billable procedures and treating other patients and teaching time   Critical care was necessary to treat or prevent imminent or life-threatening deterioration of the following conditions:  CNS failure or compromise   Critical care was time spent personally by me on  the following activities:  Blood draw for specimens, development of treatment plan with patient or surrogate, discussions with consultants, evaluation of patient's response to treatment, examination of patient, obtaining history from patient or surrogate, ordering and performing treatments and interventions, ordering and review of laboratory studies, ordering and review of radiographic studies, pulse oximetry, re-evaluation of patient's condition and review of old charts   Medications Ordered in ED Medications  alteplase (ACTIVASE) 1 mg/mL infusion SOLN 67.1 mg ( Intravenous Rate/Dose Change 04/01/21 2129)    Followed by  0.9 %  sodium chloride infusion (has no administration in time range)  iohexol (OMNIPAQUE) 350 MG/ML injection 100 mL (100 mLs Intravenous Contrast Given 04/01/21 2042)    ED Course  I have reviewed the triage vital signs and the nursing notes.  Pertinent labs & imaging results that were available during my care of the patient were reviewed by me and considered in my medical decision making (see chart for details).    MDM Rules/Calculators/A&P                           Evidently the patient has had symptoms consistent with a transient ischemic attack earlier in the week, she is here with more dense symptoms which have been persistent, last seen normal was 1 hour ago at 730, code stroke was activated on arrival, I saw the patient with the paramedics when they arrived immediately.  She is going to CT scan now.  I would not be surprised if this patient was having intermittent seizures, she will go unresponsive for 5 to 20 minutes according to the family members who have now arrived.  Today she was unresponsive and then started to lean to the left, she now has a subtle left facial droop but is gradually improving.  The code stroke  was activated secondary to the focal neurologic deficits,  Radiology called me at 8:50 PM stating CT scan is negative for any acute findings.  The  telemetry neurologist has called me back and believes that tPA would be reasonable in this patient, he is aware that the patient is having episodes where she goes completely unresponsive and that this did occur tonight before she had a left facial droop, he states he would consider seizures but states that that is not a contraindication to getting tPA and he has discussed this with the family who has agreed to treatment with tPA.  D/w Neuro at [facility] - they will admit to Neuro ICU. 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

## 2021-04-02 ENCOUNTER — Inpatient Hospital Stay (HOSPITAL_COMMUNITY): Payer: PPO

## 2021-04-02 ENCOUNTER — Other Ambulatory Visit: Payer: Self-pay | Admitting: Internal Medicine

## 2021-04-02 ENCOUNTER — Observation Stay (HOSPITAL_COMMUNITY): Payer: PPO

## 2021-04-02 DIAGNOSIS — I6350 Cerebral infarction due to unspecified occlusion or stenosis of unspecified cerebral artery: Secondary | ICD-10-CM | POA: Diagnosis not present

## 2021-04-02 DIAGNOSIS — G319 Degenerative disease of nervous system, unspecified: Secondary | ICD-10-CM | POA: Diagnosis present

## 2021-04-02 DIAGNOSIS — I10 Essential (primary) hypertension: Secondary | ICD-10-CM | POA: Diagnosis present

## 2021-04-02 DIAGNOSIS — F419 Anxiety disorder, unspecified: Secondary | ICD-10-CM | POA: Diagnosis present

## 2021-04-02 DIAGNOSIS — E876 Hypokalemia: Secondary | ICD-10-CM | POA: Diagnosis not present

## 2021-04-02 DIAGNOSIS — Z66 Do not resuscitate: Secondary | ICD-10-CM | POA: Diagnosis present

## 2021-04-02 DIAGNOSIS — G309 Alzheimer's disease, unspecified: Secondary | ICD-10-CM | POA: Diagnosis not present

## 2021-04-02 DIAGNOSIS — I2510 Atherosclerotic heart disease of native coronary artery without angina pectoris: Secondary | ICD-10-CM | POA: Diagnosis present

## 2021-04-02 DIAGNOSIS — Z20822 Contact with and (suspected) exposure to covid-19: Secondary | ICD-10-CM | POA: Diagnosis present

## 2021-04-02 DIAGNOSIS — F32A Depression, unspecified: Secondary | ICD-10-CM | POA: Diagnosis present

## 2021-04-02 DIAGNOSIS — E782 Mixed hyperlipidemia: Secondary | ICD-10-CM | POA: Diagnosis present

## 2021-04-02 DIAGNOSIS — I639 Cerebral infarction, unspecified: Secondary | ICD-10-CM | POA: Diagnosis not present

## 2021-04-02 DIAGNOSIS — R4020 Unspecified coma: Secondary | ICD-10-CM | POA: Diagnosis not present

## 2021-04-02 DIAGNOSIS — G8929 Other chronic pain: Secondary | ICD-10-CM | POA: Diagnosis present

## 2021-04-02 DIAGNOSIS — I6389 Other cerebral infarction: Secondary | ICD-10-CM | POA: Diagnosis not present

## 2021-04-02 DIAGNOSIS — E039 Hypothyroidism, unspecified: Secondary | ICD-10-CM | POA: Diagnosis present

## 2021-04-02 DIAGNOSIS — R4781 Slurred speech: Secondary | ICD-10-CM | POA: Diagnosis present

## 2021-04-02 DIAGNOSIS — F0281 Dementia in other diseases classified elsewhere with behavioral disturbance: Secondary | ICD-10-CM | POA: Diagnosis present

## 2021-04-02 DIAGNOSIS — Z7189 Other specified counseling: Secondary | ICD-10-CM

## 2021-04-02 DIAGNOSIS — J323 Chronic sphenoidal sinusitis: Secondary | ICD-10-CM | POA: Diagnosis present

## 2021-04-02 DIAGNOSIS — G459 Transient cerebral ischemic attack, unspecified: Secondary | ICD-10-CM | POA: Diagnosis present

## 2021-04-02 DIAGNOSIS — R531 Weakness: Secondary | ICD-10-CM | POA: Diagnosis not present

## 2021-04-02 DIAGNOSIS — R29810 Facial weakness: Secondary | ICD-10-CM | POA: Diagnosis present

## 2021-04-02 DIAGNOSIS — L89896 Pressure-induced deep tissue damage of other site: Secondary | ICD-10-CM | POA: Diagnosis present

## 2021-04-02 DIAGNOSIS — N390 Urinary tract infection, site not specified: Secondary | ICD-10-CM | POA: Diagnosis present

## 2021-04-02 DIAGNOSIS — R2990 Unspecified symptoms and signs involving the nervous system: Secondary | ICD-10-CM | POA: Diagnosis not present

## 2021-04-02 DIAGNOSIS — K219 Gastro-esophageal reflux disease without esophagitis: Secondary | ICD-10-CM | POA: Diagnosis present

## 2021-04-02 DIAGNOSIS — Z8719 Personal history of other diseases of the digestive system: Secondary | ICD-10-CM | POA: Diagnosis not present

## 2021-04-02 DIAGNOSIS — N179 Acute kidney failure, unspecified: Secondary | ICD-10-CM | POA: Diagnosis present

## 2021-04-02 DIAGNOSIS — Z8673 Personal history of transient ischemic attack (TIA), and cerebral infarction without residual deficits: Secondary | ICD-10-CM | POA: Diagnosis not present

## 2021-04-02 DIAGNOSIS — D649 Anemia, unspecified: Secondary | ICD-10-CM | POA: Diagnosis present

## 2021-04-02 LAB — CBC WITH DIFFERENTIAL/PLATELET
Abs Immature Granulocytes: 0.03 10*3/uL (ref 0.00–0.07)
Basophils Absolute: 0 10*3/uL (ref 0.0–0.1)
Basophils Relative: 0 %
Eosinophils Absolute: 0.2 10*3/uL (ref 0.0–0.5)
Eosinophils Relative: 2 %
HCT: 34.8 % — ABNORMAL LOW (ref 36.0–46.0)
Hemoglobin: 11.2 g/dL — ABNORMAL LOW (ref 12.0–15.0)
Immature Granulocytes: 0 %
Lymphocytes Relative: 33 %
Lymphs Abs: 2.5 10*3/uL (ref 0.7–4.0)
MCH: 30.9 pg (ref 26.0–34.0)
MCHC: 32.2 g/dL (ref 30.0–36.0)
MCV: 96.1 fL (ref 80.0–100.0)
Monocytes Absolute: 0.5 10*3/uL (ref 0.1–1.0)
Monocytes Relative: 7 %
Neutro Abs: 4.3 10*3/uL (ref 1.7–7.7)
Neutrophils Relative %: 58 %
Platelets: 251 10*3/uL (ref 150–400)
RBC: 3.62 MIL/uL — ABNORMAL LOW (ref 3.87–5.11)
RDW: 13.8 % (ref 11.5–15.5)
WBC: 7.5 10*3/uL (ref 4.0–10.5)
nRBC: 0.3 % — ABNORMAL HIGH (ref 0.0–0.2)

## 2021-04-02 LAB — ECHOCARDIOGRAM COMPLETE
Area-P 1/2: 4.74 cm2
Calc EF: 58.4 %
Height: 65 in
S' Lateral: 2.7 cm
Single Plane A2C EF: 56.1 %
Single Plane A4C EF: 61.4 %
Weight: 2529.12 oz

## 2021-04-02 LAB — BASIC METABOLIC PANEL
Anion gap: 11 (ref 5–15)
BUN: 10 mg/dL (ref 8–23)
CO2: 24 mmol/L (ref 22–32)
Calcium: 8.9 mg/dL (ref 8.9–10.3)
Chloride: 105 mmol/L (ref 98–111)
Creatinine, Ser: 1.12 mg/dL — ABNORMAL HIGH (ref 0.44–1.00)
GFR, Estimated: 51 mL/min — ABNORMAL LOW (ref >60)
Glucose, Bld: 118 mg/dL — ABNORMAL HIGH (ref 70–99)
Potassium: 3 mmol/L — ABNORMAL LOW (ref 3.5–5.1)
Sodium: 140 mmol/L (ref 135–145)

## 2021-04-02 LAB — MRSA NEXT GEN BY PCR, NASAL: MRSA by PCR Next Gen: DETECTED — AB

## 2021-04-02 LAB — LIPID PANEL
Cholesterol: 176 mg/dL (ref 0–200)
HDL: 42 mg/dL (ref >40)
LDL Cholesterol: 110 mg/dL — ABNORMAL HIGH (ref 0–99)
Total CHOL/HDL Ratio: 4.2 RATIO
Triglycerides: 121 mg/dL (ref <150)
VLDL: 24 mg/dL (ref 0–40)

## 2021-04-02 LAB — GLUCOSE, CAPILLARY
Glucose-Capillary: 107 mg/dL — ABNORMAL HIGH (ref 70–99)
Glucose-Capillary: 103 mg/dL — ABNORMAL HIGH (ref 70–99)
Glucose-Capillary: 136 mg/dL — ABNORMAL HIGH (ref 70–99)

## 2021-04-02 LAB — MAGNESIUM: Magnesium: 2.2 mg/dL (ref 1.7–2.4)

## 2021-04-02 LAB — HEMOGLOBIN A1C
Hgb A1c MFr Bld: 5.7 % — ABNORMAL HIGH (ref 4.8–5.6)
Mean Plasma Glucose: 116.89 mg/dL

## 2021-04-02 LAB — PHOSPHORUS: Phosphorus: 4.8 mg/dL — ABNORMAL HIGH (ref 2.5–4.6)

## 2021-04-02 MED ORDER — ALPRAZOLAM 0.5 MG PO TABS: 0.5000 mg | ORAL_TABLET | Freq: Two times a day (BID) | ORAL | Status: DC | PRN

## 2021-04-02 MED ORDER — DIAZEPAM 5 MG/ML IJ SOLN: 2.5000 mg | Freq: Once | INTRAMUSCULAR | Status: AC

## 2021-04-02 MED ORDER — PRAVASTATIN SODIUM 40 MG PO TABS
40.0000 mg | ORAL_TABLET | Freq: Every day | ORAL | Status: DC
  Administered 2021-04-02 – 2021-04-03 (×2): 40 mg via ORAL
  Filled 2021-04-02 (×2): qty 1

## 2021-04-02 MED ORDER — PANTOPRAZOLE SODIUM 40 MG PO TBEC
40.0000 mg | DELAYED_RELEASE_TABLET | Freq: Every day | ORAL | Status: DC
  Administered 2021-04-02 – 2021-04-03 (×2): 40 mg via ORAL
  Filled 2021-04-02 (×2): qty 1

## 2021-04-02 MED ORDER — DULOXETINE HCL 60 MG PO CPEP
60.0000 mg | ORAL_CAPSULE | Freq: Every day | ORAL | Status: DC
  Administered 2021-04-02 – 2021-04-03 (×2): 60 mg via ORAL
  Filled 2021-04-02 (×2): qty 1

## 2021-04-02 MED ORDER — MELATONIN 5 MG PO TABS
10.0000 mg | ORAL_TABLET | Freq: Every day | ORAL | Status: DC
  Administered 2021-04-02: 10 mg via ORAL
  Filled 2021-04-02: qty 2

## 2021-04-02 MED ORDER — CHLORHEXIDINE GLUCONATE 0.12 % MT SOLN
15.0000 mL | Freq: Two times a day (BID) | OROMUCOSAL | Status: DC
  Administered 2021-04-02 – 2021-04-03 (×3): 15 mL via OROMUCOSAL
  Filled 2021-04-02 (×3): qty 15

## 2021-04-02 MED ORDER — OLANZAPINE 5 MG PO TABS
5.0000 mg | ORAL_TABLET | Freq: Every day | ORAL | Status: DC
  Administered 2021-04-02: 5 mg via ORAL
  Filled 2021-04-02 (×2): qty 1

## 2021-04-02 MED ORDER — OLANZAPINE 2.5 MG PO TABS: 2.5000 mg | ORAL_TABLET | Freq: Every day | ORAL | Status: DC

## 2021-04-02 MED ORDER — DIAZEPAM 5 MG/ML IJ SOLN
2.5000 mg | Freq: Once | INTRAMUSCULAR | Status: AC
  Administered 2021-04-02: 2.5 mg via INTRAVENOUS
  Filled 2021-04-02: qty 2

## 2021-04-02 MED ORDER — ALPRAZOLAM 0.5 MG PO TABS
0.5000 mg | ORAL_TABLET | Freq: Two times a day (BID) | ORAL | Status: DC
  Administered 2021-04-02: 0.5 mg via ORAL
  Administered 2021-04-02: 0.25 mg via ORAL
  Administered 2021-04-03: 0.5 mg via ORAL
  Filled 2021-04-02 (×3): qty 1

## 2021-04-02 MED ORDER — FAMOTIDINE 20 MG PO TABS
20.0000 mg | ORAL_TABLET | Freq: Two times a day (BID) | ORAL | Status: DC
  Administered 2021-04-02 – 2021-04-03 (×3): 20 mg via ORAL
  Filled 2021-04-02 (×3): qty 1

## 2021-04-02 MED ORDER — INSULIN ASPART 100 UNIT/ML IJ SOLN
0.0000 [IU] | Freq: Three times a day (TID) | INTRAMUSCULAR | Status: DC
  Administered 2021-04-03: 1 [IU] via SUBCUTANEOUS

## 2021-04-02 MED ORDER — LEVOTHYROXINE SODIUM 25 MCG PO TABS
25.0000 ug | ORAL_TABLET | Freq: Every day | ORAL | Status: DC
  Administered 2021-04-02 – 2021-04-03 (×2): 25 ug via ORAL
  Filled 2021-04-02 (×2): qty 1

## 2021-04-02 MED ORDER — POTASSIUM CHLORIDE 20 MEQ PO PACK
20.0000 meq | PACK | Freq: Two times a day (BID) | ORAL | Status: AC
  Administered 2021-04-02 (×2): 20 meq via ORAL
  Filled 2021-04-02 (×2): qty 1

## 2021-04-02 MED ORDER — OLANZAPINE 5 MG PO TABS: 5.0000 mg | ORAL_TABLET | Freq: Every day | ORAL | Status: DC

## 2021-04-02 MED ORDER — OLANZAPINE 2.5 MG PO TABS
2.5000 mg | ORAL_TABLET | Freq: Every day | ORAL | Status: DC
  Administered 2021-04-02 – 2021-04-03 (×2): 2.5 mg via ORAL
  Filled 2021-04-02 (×2): qty 1

## 2021-04-02 MED ORDER — CHLORHEXIDINE GLUCONATE CLOTH 2 % EX PADS
6.0000 | MEDICATED_PAD | Freq: Every day | CUTANEOUS | Status: DC
  Administered 2021-04-02: 6 via TOPICAL

## 2021-04-02 MED ORDER — DIAZEPAM 5 MG/ML IJ SOLN
INTRAMUSCULAR | Status: AC
  Administered 2021-04-02: 2.5 mg via INTRAVENOUS
  Filled 2021-04-02: qty 2

## 2021-04-02 NOTE — Progress Notes (Signed)
  Echocardiogram 2D Echocardiogram has been performed.  Lori Bell 04/02/2021, 9:34 AM

## 2021-04-02 NOTE — Progress Notes (Addendum)
STROKE TEAM PROGRESS NOTE   INTERVAL HISTORY Her daughter and granddaughter are at the bedside.  She is pleasant and confused, cooperative. Palliative consult nurse is at bedside speaking with family.   Vitals:   04/02/21 0900 04/02/21 1000 04/02/21 1100 04/02/21 1200  BP: 139/75 135/66 116/80 (!) 110/93  Pulse: (!) 56 (!) 59 67 87  Resp: 17 18 20 20   Temp:      TempSrc:      SpO2: 95% 97% 96% 98%  Weight:      Height:       CBC:  Recent Labs  Lab 04/01/21 2028 04/02/21 0949  WBC 5.0 7.5  NEUTROABS 2.0 4.3  HGB 10.4* 11.2*  HCT 31.8* 34.8*  MCV 93.8 96.1  PLT 216 251   Basic Metabolic Panel:  Recent Labs  Lab 04/01/21 2028 04/02/21 0949  NA 138 140  K 2.7* 3.0*  CL 101 105  CO2 28 24  GLUCOSE 116* 118*  BUN 11 10  CREATININE 1.29* 1.12*  CALCIUM 8.8* 8.9  MG  --  2.2  PHOS  --  4.8*     Lipid Panel: No results for input(s): CHOL, TRIG, HDL, CHOLHDL, VLDL, LDLCALC in the last 168 hours.  HgbA1c:  Recent Labs  Lab 04/02/21 0949  HGBA1C 5.7*   Urine Drug Screen: No results for input(s): LABOPIA, COCAINSCRNUR, LABBENZ, AMPHETMU, THCU, LABBARB in the last 168 hours.  Alcohol Level  Recent Labs  Lab 04/01/21 2028  ETH <10    IMAGING past 24 hours CT ANGIO HEAD NECK W WO CM  Result Date: 04/01/2021 CLINICAL DATA:  Initial evaluation for neuro deficit, stroke suspected. EXAM: CT ANGIOGRAPHY HEAD AND NECK TECHNIQUE: Multidetector CT imaging of the head and neck was performed using the standard protocol during bolus administration of intravenous contrast. Multiplanar CT image reconstructions and MIPs were obtained to evaluate the vascular anatomy. Carotid stenosis measurements (when applicable) are obtained utilizing NASCET criteria, using the distal internal carotid diameter as the denominator. CONTRAST:  100mL OMNIPAQUE IOHEXOL 350 MG/ML SOLN COMPARISON:  Head CT from earlier the same day. FINDINGS: CTA NECK FINDINGS Aortic arch: Visualized aortic arch normal in  caliber with normal branch pattern. Mild atheromatous change about the arch and origin of the great vessels without hemodynamically significant stenosis. Right carotid system: Right CCA tortuous proximally but is widely patent to the bifurcation. Mild atheromatous change about the right carotid bulb without stenosis. Right ICA patent distally without stenosis, dissection or occlusion. Left carotid system: Left CCA tortuous proximally but is widely patent to the bifurcation without stenosis. Mild for age atheromatous change about the left carotid bulb without stenosis. Left ICA patent distally without stenosis, dissection or occlusion. Vertebral arteries: Both vertebral arteries arise from the subclavian arteries. No proximal subclavian artery stenosis. Atheromatous change at the origins of both vertebral arteries with associated moderate approximate 50% ostial stenoses. Vertebral artery tortuous proximally but are otherwise widely patent without stenosis, dissection or occlusion. Skeleton: No visible acute osseous finding. No discrete or worrisome osseous lesions. Degenerative changes noted about the left TMJ. Other neck: No other acute soft tissue abnormality within the neck. No mass or adenopathy. Upper chest: Scattered interlobular septal thickening with ground-glass opacity within the visualized lungs, suggesting a degree of pulmonary interstitial edema. Review of the MIP images confirms the above findings CTA HEAD FINDINGS Anterior circulation: Both internal carotid arteries widely patent to the termini without stenosis. A1 segments widely patent. Normal anterior communicating artery complex. Anterior cerebral arteries patent  to their distal aspects without stenosis. No M1 stenosis or occlusion. Normal MCA bifurcations. No proximal MCA branch occlusion. Distal MCA branches well perfused and symmetric. Posterior circulation: Both V4 segments patent to the vertebrobasilar junction without stenosis. Both PICA  origins patent and normal. Basilar widely patent to its distal aspect without stenosis. Superior cerebellar arteries patent bilaterally. Both PCAs primarily supplied via the basilar and are well perfused to there distal aspects. Venous sinuses: Grossly patent allowing for timing the contrast bolus. Anatomic variants: None significant.  No aneurysm. Review of the MIP images confirms the above findings IMPRESSION: 1. Negative CTA for emergent large vessel occlusion. 2. Mild for age atheromatous change about the carotid bifurcations without hemodynamically significant stenosis. 3. Moderate approximate 50% stenoses at the origins of both vertebral arteries. Otherwise wide patency of the posterior circulation. 4. Diffuse tortuosity of the major arterial vasculature of the head and neck, suggesting chronic underlying hypertension. 5. Diffuse interlobular septal thickening with ground-glass opacity within the visualized lungs, suggesting a degree of pulmonary interstitial edema. Critical Value/emergent results were called by telephone at the time of interpretation on 04/01/2021 at 8:48 pm to provider [facility] , who verbally acknowledged these results. Electronically Signed   By: Rise Mu M.D.   On: 04/01/2021 21:10   ECHOCARDIOGRAM COMPLETE  Result Date: 04/02/2021    ECHOCARDIOGRAM REPORT   Patient Name:   CHANDEL ZAUN Date of Exam: 04/02/2021 Medical Rec #:  [id number]      Height:       65.0 in Accession #:    [id number]     Weight:       158.1 lb Date of Birth:  October 27, 1945       BSA:          1.790 m Patient Age:    75 years       BP:           152/92 mmHg Patient Gender: F              HR:           60 bpm. Exam Location:  Inpatient Procedure: 2D Echo, Cardiac Doppler and Color Doppler Indications:    TIA G45.9  History:        Patient has prior history of Echocardiogram examinations, most                 recent 04/15/2014. Risk Factors:Hypertension, Dyslipidemia and                 Non-Smoker.   Sonographer:    Renella Cunas RDCS Referring Phys: [id number] SRISHTI L BHAGAT IMPRESSIONS  1. Left ventricular ejection fraction, by estimation, is 60 to 65%. The left ventricle has normal function. The left ventricle has no regional wall motion abnormalities. Left ventricular diastolic parameters are consistent with Grade II diastolic dysfunction (pseudonormalization). Elevated left ventricular end-diastolic pressure. The E/e' is 23.6.  2. Right ventricular systolic function is normal. The right ventricular size is normal. There is normal pulmonary artery systolic pressure. The estimated right ventricular systolic pressure is 31.5 mmHg.  3. Left atrial size was mildly dilated.  4. The mitral valve is grossly normal. Trivial mitral valve regurgitation. No evidence of mitral stenosis.  5. The aortic valve is tricuspid. There is mild calcification of the aortic valve. Aortic valve regurgitation is mild. Mild aortic valve sclerosis is present, with no evidence of aortic valve stenosis.  6. The inferior vena cava is normal in size with greater than  50% respiratory variability, suggesting right atrial pressure of 3 mmHg. Conclusion(s)/Recommendation(s): No intracardiac source of embolism detected on this transthoracic study. A transesophageal echocardiogram is recommended to exclude cardiac source of embolism if clinically indicated. FINDINGS  Left Ventricle: Left ventricular ejection fraction, by estimation, is 60 to 65%. The left ventricle has normal function. The left ventricle has no regional wall motion abnormalities. The left ventricular internal cavity size was normal in size. There is  no left ventricular hypertrophy. Left ventricular diastolic parameters are consistent with Grade II diastolic dysfunction (pseudonormalization). Elevated left ventricular end-diastolic pressure. The E/e' is 23.6. Right Ventricle: The right ventricular size is normal. No increase in right ventricular wall thickness. Right ventricular  systolic function is normal. There is normal pulmonary artery systolic pressure. The tricuspid regurgitant velocity is 2.67 m/s, and  with an assumed right atrial pressure of 3 mmHg, the estimated right ventricular systolic pressure is 31.5 mmHg. Left Atrium: Left atrial size was mildly dilated. Right Atrium: Right atrial size was normal in size. Pericardium: Trivial pericardial effusion is present. Presence of pericardial fat pad. Mitral Valve: The mitral valve is grossly normal. Mild mitral annular calcification. Trivial mitral valve regurgitation. No evidence of mitral valve stenosis. Tricuspid Valve: The tricuspid valve is grossly normal. Tricuspid valve regurgitation is trivial. No evidence of tricuspid stenosis. Aortic Valve: The aortic valve is tricuspid. There is mild calcification of the aortic valve. Aortic valve regurgitation is mild. Mild aortic valve sclerosis is present, with no evidence of aortic valve stenosis. Pulmonic Valve: The pulmonic valve was grossly normal. Pulmonic valve regurgitation is mild. No evidence of pulmonic stenosis. Aorta: The aortic root and ascending aorta are structurally normal, with no evidence of dilitation. Venous: The inferior vena cava is normal in size with greater than 50% respiratory variability, suggesting right atrial pressure of 3 mmHg. IAS/Shunts: The atrial septum is grossly normal.  LEFT VENTRICLE PLAX 2D LVIDd:         3.80 cm     Diastology LVIDs:         2.70 cm     LV e' medial:    5.46 cm/s LV PW:         1.00 cm     LV E/e' medial:  23.6 LV IVS:        1.00 cm     LV e' lateral:   5.51 cm/s LVOT diam:     1.90 cm     LV E/e' lateral: 23.4 LV SV:         62 LV SV Index:   35 LVOT Area:     2.84 cm  LV Volumes (MOD) LV vol d, MOD A2C: 72.5 ml LV vol d, MOD A4C: 68.2 ml LV vol s, MOD A2C: 31.8 ml LV vol s, MOD A4C: 26.3 ml LV SV MOD A2C:     40.7 ml LV SV MOD A4C:     68.2 ml LV SV MOD BP:      41.0 ml RIGHT VENTRICLE RV S prime:     11.90 cm/s TAPSE  (M-mode): 2.1 cm LEFT ATRIUM             Index       RIGHT ATRIUM           Index LA diam:        3.40 cm 1.90 cm/m  RA Area:     16.50 cm LA Vol (A2C):   42.4 ml 23.69 ml/m RA Volume:   48.90 ml  27.32 ml/m LA Vol (A4C):   75.7 ml 42.29 ml/m LA Biplane Vol: 57.0 ml 31.85 ml/m  AORTIC VALVE LVOT Vmax:   73.20 cm/s LVOT Vmean:  55.200 cm/s LVOT VTI:    0.219 m  AORTA Ao Root diam: 3.20 cm Ao Asc diam:  3.50 cm MITRAL VALVE                TRICUSPID VALVE MV Area (PHT): 4.74 cm     TR Peak grad:   28.5 mmHg MV Decel Time: 160 msec     TR Vmax:        267.00 cm/s MV E velocity: 129.00 cm/s MV A velocity: 71.80 cm/s   SHUNTS MV E/A ratio:  1.80         Systemic VTI:  0.22 m                             Systemic Diam: 1.90 cm Lennie Odor MD Electronically signed by Lennie Odor MD Signature Date/Time: 04/02/2021/9:49:27 AM    Final    CT HEAD CODE STROKE WO CONTRAST  Result Date: 04/01/2021 CLINICAL DATA:  Code stroke. Initial evaluation for acute stroke, left facial droop. EXAM: CT HEAD WITHOUT CONTRAST TECHNIQUE: Contiguous axial images were obtained from the base of the skull through the vertex without intravenous contrast. COMPARISON:  CT from 10/08/2020. FINDINGS: Brain: Generalized age-related cerebral atrophy with chronic small vessel ischemic disease. No acute intracranial hemorrhage. No acute large vessel territory infarct. No mass lesion, midline shift or mass effect. No hydrocephalus or extra-axial fluid collection. Vascular: No hyperdense vessel. Scattered vascular calcifications noted within the carotid siphons. Skull: Scalp soft tissues and calvarium within normal limits. Sinuses/Orbits: Globes and orbital soft tissues within normal limits. Chronic left sphenoid and maxillary sinusitis. Right sphenoid sinus retention cyst. Chronic appearing left mastoid effusion. Negative nasopharynx. Other: None. ASPECTS [facility] Stroke Program Early CT Score) - Ganglionic level infarction (caudate, lentiform  nuclei, internal capsule, insula, M1-M3 cortex): 7 - Supraganglionic infarction (M4-M6 cortex): 3 Total score (0-10 with 10 being normal): 10 IMPRESSION: 1. No acute intracranial infarct or other abnormality. 2. ASPECTS is 10. 3. Age-related cerebral atrophy with mild chronic small vessel ischemic disease. 4. Chronic left maxillary and sphenoid sinusitis. 5. Left mastoid effusion, also chronic in appearance. Critical Value/emergent results were called by telephone at the time of interpretation on 04/01/2021 at 8:48 pm to provider [facility] , who verbally acknowledged these results. Electronically Signed   By: Rise Mu M.D.   On: 04/01/2021 20:54    PHYSICAL EXAM  Neuro: Mental Status: Patient is awake, alert, oriented to person only Patient is able to identify her daughter at the bedside but otherwise gives very limited history Mild difficulty with complex repetition and naming and low-frequency objects Cranial Nerves: II: Visual Fields are full. Pupils are equal, round, and reactive to light.  III,IV, VI: EOMI with mild left eye ptosis.  Saccadic pursuits V: Facial sensation is symmetric to temperature VII: Facial movement is notable for subtle left facial droop.  VIII: hearing is intact to voice X: Uvula elevates symmetrically XI: Shoulder shrug is symmetric. XII: tongue is midline without atrophy or fasciculations.  Motor: Tone is normal. Bulk is normal.  Confrontational strength testing is limited secondary to her dementia.  There is mild symmetric pronation of the upper extremities without drift.  There is no drift of the bilateral lower extremities.  However she is slower on the  left side with finger-to-nose and heel-to-shin testing Sensory: Grossly symmetric to light noxious stimulation (tickle).     ASSESSMENT/PLAN  75 year old woman with a past medical history significant for Altheimer's dementia, hypertension, hyperlipidemia, coronary artery disease, presenting with an  episode of shortness of breath followed by loss of consciousness and left-sided facial droop with slowness to respond.  Differential includes stroke/TIA, seizure, and patient is status post tPA given at [facility].    TIA:    secondary to  small vessel disease   Code Stroke  CT head No acute abnormality.  Small vessel disease. Atrophy.  ASPECTS 10.     CTA head & neck   04/01/2021 1. Negative CTA for emergent large vessel occlusion. 2. Mild for age atheromatous change about the carotid bifurcations without hemodynamically significant stenosis. 3. Moderate approximate 50% stenoses at the origins of both vertebral arteries. Otherwise wide patency of the posterior circulation. 4. Diffuse tortuosity of the major arterial vasculature of the head and neck, suggesting chronic underlying hypertension. 5. Diffuse interlobular septal thickening with ground-glass opacity within the visualized lungs, suggesting a degree of pulmonary interstitial edema.  MRI  brain pending.  2D Echo E 60-65%, LA mildly dilated. No IA shunt seen.    LDL 111 HgbA1c 5.7 VTE prophylaxis -      Diet   DIET DYS 2 Room service appropriate? Yes; Fluid consistency: Thin   No antithrombotic prior to admission, now on No antithrombotic. Pending post tPA imaging studies.  Therapy recommendations:  na Disposition:  not yet available.   Hypertension Home meds:  Ibersatan, inderal LA,  Stable Permissive hypertension (OK if < 220/120) but gradually normalize in 5-7 days Long-term BP goal normotensive  Hyperlipidemia Home meds:  pravastatin,  resumed in hospital LDL 111, goal < 70  High intensity statin   Continue statin at discharge  HgbA1c 5.7, goal < 7.0 CBGs Recent Labs    04/01/21 2028 04/02/21 1143  GLUCAP 112* 107*    SSI  Other Stroke Risk Factors  Advanced Age >/= 53      Hospital day # 0   ATTENDING ATTESTATION:   Pt with stroke s/p tPA  improving. Brain MRI ordered and  pending. Added SCD's for DVT prophylaxis. Hold on lovenox for now.   Dr. Viviann Spare evaluated pt independently, reviewed imaging, chart, labs. Discussed and formulated plan with the APP. Please see APP note above for details.      This patient is critically ill due to stroke s/p tPA and at significant risk of neurological worsening, death form heart failure, respiratory failure, recurrent stroke, bleeding from [facility], seizure, sepsis. This patient's care requires constant monitoring of vital signs, hemodynamics, respiratory and cardiac monitoring, review of multiple databases, neurological assessment, discussion with family, other specialists and medical decision making of high complexity. I spent 35 minutes of neurocritical care time in the care of this patient.   Hertha Gergen,MD   To contact Stroke Continuity provider, please refer to [email or web address]. After hours, contact General Neurology

## 2021-04-02 NOTE — Evaluation (Signed)
Clinical/Bedside Swallow Evaluation Patient Details  Name: CARESSA SCEARCE MRN: [id number] Date of Birth: 16-Apr-1946  Today's Date: 04/02/2021 Time: SLP Start Time (ACUTE ONLY): 0240 SLP Stop Time (ACUTE ONLY): 0945 SLP Time Calculation (min) (ACUTE ONLY): 20 min  Past Medical History:  Past Medical History:  Diagnosis Date   Acute (reversible) ischemia of large intestine, extent unspecified (HCC)    Acute ischemic colitis (HCC)    Anxiety disorder, unspecified    ASCVD (arteriosclerotic cardiovascular disease)    Depression    Essential (primary) hypertension    Gastrointestinal hemorrhage, unspecified    GERD (gastroesophageal reflux disease)    GI bleeding 06/10/2017   Hyperlipidemia    Hypertension    Hypothyroidism    Hypothyroidism, unspecified    Major depressive disorder, single episode, unspecified    Mixed hyperlipidemia    Unspecified dementia without behavioral disturbance (HCC)    Upper abdominal pain, unspecified    Past Surgical History:  Past Surgical History:  Procedure Laterality Date   BIOPSY  06/11/2017   Procedure: BIOPSY;  Surgeon: West Bali, MD;  Location: [facility];  Service: Endoscopy;;  colon   BREAST BIOPSY  1997   Left   COLONOSCOPY  2003   Dr. Ewing Schlein: internal and external hemorrhoids, a few tin hyperplastic-appearing polyps not biopsed   COLONOSCOPY WITH PROPOFOL N/A 06/11/2017   Procedure: COLONOSCOPY WITH PROPOFOL;  Surgeon: West Bali, MD;  Location: [facility];  Service: Endoscopy;  Laterality: N/A;   INNER EAR SURGERY  1966   Right   TOTAL ABDOMINAL HYSTERECTOMY W/ BILATERAL SALPINGOOPHORECTOMY  05/2009   TUBAL LIGATION  1970   HPI:  Patient is a 75 y.o. 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 She presented to the hospital on 8/13 in evening whe she  started having chest pain and labored breathing. When getting her up from bed she then started to go limp and became completely unresponsive but after 20 minutes she started to improve. Family noticed left face and eye drooping and speech was low/whispering but not slurred.   Assessment / Plan / Recommendation Clinical Impression  Patient presents with mild oropharyngeal dysphagia with likely primary cause being dementia. Per patient's daughter, she has been having to dice up meats very finely for patient secondary to her difficulty with more solid textures. Daughter also reported that patient does not eat much at all and pretty much the only thing she will seek out on her own is cookies. Patient attempted to put in top dentures but c/o soreness of both corners of lips and she was unable to tolerate dentures. She did not exhibit any overt s/s aspiration or penetration with thin liquids or puree solids. She requires cues for initiating and maintaining atteniton and to continue to actively eat/drink. SLP Visit Diagnosis: Dysphagia, unspecified (R13.10)    Aspiration Risk  No limitations;Mild aspiration risk    Diet Recommendation Dysphagia 2 (Fine chop);Thin liquid   Liquid Administration via: Cup;Straw Medication Administration: Whole meds with liquid Supervision: Patient able to self feed;Full supervision/cueing for compensatory strategies Compensations: Minimize environmental distractions;Slow rate;Small sips/bites Postural Changes: Seated upright at 90 degrees    Other  Recommendations Oral Care Recommendations: Oral care BID;Staff/trained caregiver to provide oral care   Follow up Recommendations None      Frequency and Duration min 1 x/week  1 week       Prognosis  Prognosis for Safe Diet Advancement: Good      Swallow Study   General Date of Onset: 04/01/21 HPI: Patient is a 75 y.o. 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 She presented to the hospital on 8/13 in evening whe she started having chest pain and labored breathing. When getting her up from bed she then started to go limp and became completely unresponsive but after 20 minutes she started to improve. Family noticed left face and eye drooping and speech was low/whispering but not slurred. Type of Study: Bedside Swallow Evaluation Previous Swallow Assessment: none found Diet Prior to this Study: NPO Temperature Spikes Noted: No Respiratory Status: Room air History of Recent Intubation: No Behavior/Cognition: Alert;Cooperative;Pleasant mood;Confused Oral Cavity Assessment: Within Functional Limits Oral Care Completed by SLP: No Oral Cavity - Dentition: Missing dentition;Edentulous;Other (Comment) (has bottom front teeth only. Dentures in room but patient unable to put in and she is reporting sores on corners of lips.) Self-Feeding Abilities: Needs set up;Needs assist Patient Positioning: Upright in bed Baseline Vocal Quality: Normal Volitional Cough: Cognitively unable to elicit Volitional Swallow: Unable to elicit    Oral/Motor/Sensory Function Overall Oral Motor/Sensory Function: Within functional limits   Ice Chips     Thin Liquid Thin Liquid: Within functional limits Presentation: Straw;Self Fed    Nectar Thick     Honey Thick     Puree Puree: Within functional limits Presentation: Spoon;Self Fed   Solid     Solid: Not tested     Angela Nevin, MA, CCC-SLP Speech Therapy [facility]

## 2021-04-02 NOTE — Consult Note (Signed)
Palliative Medicine Inpatient Consult Note  Reason for consult:  Family struggling with TPA decision this admission, assist with guidance for future  HPI:  Per intake H&P -04/02/21 by Dr. Curly Shores, Neurology-> HPI: "Lori Bell is a 75 y.o. female with a past medical history significant for Alzheimer's dementia with behavioral disturbance (FAST 5), hypertension, hyperlipidemia, coronary artery disease, GERD, depression, osteoporosis, ischemic colitis, GI bleed (2018) hypothyroidism   Today the patient seemed in her usual state of health. Son was putting patient to bed at 7:30 PM, at 7:25 PM but when she was laid down, she had chest pain and labored breathing.  They therefore called daughter who lives next door and is a CNA to help. They got her up and she went limp, leaning to the left and she was completely unresponsive.  Subsequently they carried her to the living room and sat her in a chair (did not want a lay her down due to concern for her breathing) and 20 minutes later she started coming around.  At that point they noticed left face and eye drooping and speech was low/whispering but not necessarily slurred.    Monday she had a 23 minute episode that was similar, but erect, not limp (just not responsive to talking, touching or rubbing her face). This started in November.  Family notes the patient is always short of breath with these episodes, and to date she has had 5-6 episodes total, lasting anywhere from 10-30 minutes.  After their second ED visit for this in November, daughter notes she was told she didn't need to bring mother in every time she had an episode so has managed them at home since.   On review of systems, daughter also notes that the patient has had a tremor in her lower lip for the last 3 to 4 months.  The patient also has concern for CHF, taking increasing doses of lasix lately (stared on 20 mg daily, daughter giving 40 mg).  Daughter notes patient has been having swelling in her  hands and face.  Additionally her appetite is poor and she does not really eat any meals, just cookies and bananas.  Daughter reports there are some days that the patient does not eat at all, though she will typically drink about half a gallon of water mixed with peach flavored tea.  Due to arthritis in her hands the patient also takes Tylenol 2-3 times weekly.   At baseline she reports her mother has urinary incontinence daily in her clothes and sometimes bowel incontinence, forgets where the bathroom is, pees at the back door, has wet the bed 3 times in the last 4 months.  Patient requires 24/7 supervision since Christmas. Refuses baths most of the time, requires help with bathing and dressing (sometimes underwear on head for example).   Per palliative care note 03/01/2021: "Patient seen today for follow up on behavior concern related to her dementia. She is currently on Zyprexa 2.16m in the mornings and 565mat bedtime. Takes Xanax 0.2572maily as needed. Spoke with her daughter [name] phone, she report mild improvement in behavior, report patient still has difficulty staying still, report patient wanders a lot within her home, report symptoms affects her sleep during the night. No report of falls, ambulates independently without assistive device. Report good appetite, no report of weight loss. Report occasional incontinence episodes."  "CODE STATUS: DNR Patient's goal of care is comfort. Signed DNR and MOST forms present in home, copy on [facility] EMR. Details  of MOST form include limited additional intervention, antibiotics if indicated, IV fluids for a defined trial period, no feeding tube."   Regarding her agitation associated with her Alzheimer's disease, her aggressive behavior improved on 2.5 mg Zyprexa in the morning and 5 mg nightly, with support of melatonin for sleep.  She is also receiving Xanax twice daily on weekdays (she takes 1/2 of 0.5 mg tablet BID) Per primary care notes from June 2022  she is having some dysphagia especially with harder to chew foods such as meat, not eating as much, and some pills that she was having difficulty swallowing (fish oil, calcium) were stopped.  She also uses CBD Gummies and does have some hallucinations at baseline. Aspirin was stopped at this televisit for reasons unclear to the daughter.    LKW: 19:25 PM tPA given?:  Yes 8/13 21:29 at [facility] arrival to [facility] I was notified that tPA was stopped early secondary to oral bleeding which has since subsided IA performed?: No,  Premorbid modified rankin scale:     3 - Moderate disability. Requires some help, but able to walk unassisted. NIHSS 3 for disorientation and new left facial asymmetry (confirmed with family) on [employer] eval"   Transferred from [facility] to [facility] for continued stroke care.  Clinical Assessment/Goals of Care: I have reviewed medical records including EPIC notes, labs and imaging, received report from bedside RN Tabitha, assessed the patient. I was in room and participated in neurology assessment visit this morning with family.    I met with Patient, and Daughter [name] to further discuss diagnosis prognosis, GOC, EOL wishes, disposition and options.   I introduced Palliative Medicine as specialized medical care for people living with serious illness. It focuses on providing relief from the symptoms and stress of a serious illness. The goal is to improve quality of life for both the patient and the family. [name] is familiar with palliative medicine as her mother has had outpatient with [facility] services but has questions.  She is not sure when she can call them. Her mother has had multiple episodes of SOB and after two visits last November she was told by the ED that she did not have to bring her in every time. I suggested that once her mom is home she have a conversation with Alba Cory Mbemena the NP and share her questions.  She and her  sisters and [name]'s son and daughter have been rotating care for her mother in her mother's home. Her 2 sisters are older and expressing concerns.  Patient lives in her own home and does not have medicaid or long term care insurance. I have alerted [facility] that she is inpatient currently.  [name] shared that she felt the doctors and nurses in the ER at [facility] were questioning her decision to agree to TPA when the stroke team recommended it. She felt it was in the best interest for her mother if the facial droop could be improved. Her mother has been functional at home and "not at death's door."  I supported her in the decision that she made for her mom. At home patient is up and about. She can walk with walker and is not steady with a cane.   A detailed discussion was had today regarding advanced directives.  Concepts specific to code status, artifical feeding and hydration, continued IV antibiotics and rehospitalization was had.  The difference between a aggressive medical intervention path  and a palliative comfort care path  for this patient at this time was had. Values and goals of care important to patient and family were attempted to be elicited.   Discussed the importance of continued conversation with family and their  medical providers regarding overall plan of care and treatment options, ensuring decisions are within the context of the patients values and GOCs.   Decision Maker:Daughter Jeanene Erb, MPOA  SUMMARY OF RECOMMENDATIONS    Code Status/Advance Care Planning:  DNAR/DNI  MOST- (hard copy on unit chart) DNR, If pulse or breathing limited additional interventions, antibiotics if indicated, IV Fluids if indicated, and no feeding tube   Symptom Management:  Agitation: Olanzapine 2.5 am, 5.0 pm Alprazolam 0.5 mg, (daughter states she takes 0.25 bid at home) Depression: Cymbalta 60 mg daily  Palliative Prophylaxis:  Dysphagia: Dysphagia diet per SLP evaluation GI  Prophylaxis: Pantoprazole Difficulty sleeping: Melatonin at night prn  Psycho-social/Spiritual:  Desire for further Chaplaincy support: no Additional Recommendations:  [facility] notification at discharge   Prognosis: Stroke symptoms are improving.   Discharge Planning: Family plans for discharge to home when approriate.    Vitals with BMI 04/02/2021 04/02/2021 04/02/2021  Height - - -  Weight - - -  BMI - - -  Systolic 121 975 883  Diastolic 80 66 75  Pulse 67 59 56    PPS: 60%   This conversation/these recommendations were discussed with patient primary care team via secure chat, Dr. Reeves Forth.  Thank you for the opportunity to participate in the care of this patient and family.   Time In:10:00 Time Out:11:43 Total Time: >70 Greater than 50%  of this time was spent counseling and coordinating care related to the above assessment and plan.  [facility] Pall[facility] Medicine Team Team Cell Phone: [phone] Please utilize secure chat with additional questions, if there is no response within 30 minutes please call the above phone number  Palliative Medicine Team providers are available by phone from 7am to 7pm daily and can be reached through the team cell phone.  Should this patient require assistance outside of these hours, please call the patient's attending physician.

## 2021-04-02 NOTE — Procedures (Signed)
Patient Name: Lori Bell  MRN: [id number]  Epilepsy Attending: Charlsie Quest  Referring Physician/Provider: Dr Brooke Dare Date: 04/03/2021 Duration: 25.21 mins  Patient history: 75 year old woman with a past medical history significant for Altheimer's dementia, hypertension, hyperlipidemia, coronary artery disease, presenting with an episode of shortness of breath followed by loss of consciousness and left-sided facial droop with slowness to respond. EEG to evaluate for seizure.   Level of alertness: Awake, asleep  AEDs during EEG study: Xanax  Technical aspects: This EEG study was done with scalp electrodes positioned according to the 10-20 International system of electrode placement. Electrical activity was acquired at a sampling rate of 500Hz  and reviewed with a high frequency filter of 70Hz  and a low frequency filter of 1Hz . EEG data were recorded continuously and digitally stored.   Description: The posterior dominant rhythm consists of 7 Hz activity of moderate voltage (25-35 uV) seen predominantly in posterior head regions, symmetric and reactive to eye opening and eye closing. Sleep was characterized by vertex waves, sleep spindles (12 to 14 Hz), maximal frontocentral region. Hyperventilation and photic stimulation were not performed.     ABNORMALITY - Background slow  IMPRESSION: This study is suggestive of mild diffuse encephalopathy, nonspecific to etiology. No seizures or epileptiform discharges were seen throughout the recording.  [name] 

## 2021-04-02 NOTE — Progress Notes (Signed)
Patient very agitated and not redirectable, wants to get out of bed and not responding to safety limits. Patient with baseline dementia and confused. Daughter at bedside to help with orientation but could not convince patient to stay in bed. Provider notified and order for soft waist belt received, one time IV Valium.  Aris Lot, RN

## 2021-04-03 ENCOUNTER — Inpatient Hospital Stay (HOSPITAL_COMMUNITY): Payer: PPO

## 2021-04-03 DIAGNOSIS — N390 Urinary tract infection, site not specified: Secondary | ICD-10-CM | POA: Diagnosis present

## 2021-04-03 DIAGNOSIS — F0280 Dementia in other diseases classified elsewhere without behavioral disturbance: Secondary | ICD-10-CM

## 2021-04-03 DIAGNOSIS — I639 Cerebral infarction, unspecified: Secondary | ICD-10-CM

## 2021-04-03 DIAGNOSIS — E876 Hypokalemia: Secondary | ICD-10-CM

## 2021-04-03 DIAGNOSIS — R2990 Unspecified symptoms and signs involving the nervous system: Secondary | ICD-10-CM

## 2021-04-03 DIAGNOSIS — Z515 Encounter for palliative care: Secondary | ICD-10-CM

## 2021-04-03 DIAGNOSIS — G309 Alzheimer's disease, unspecified: Secondary | ICD-10-CM

## 2021-04-03 DIAGNOSIS — R4020 Unspecified coma: Secondary | ICD-10-CM

## 2021-04-03 LAB — URINALYSIS, ROUTINE W REFLEX MICROSCOPIC
Bilirubin Urine: NEGATIVE
Glucose, UA: NEGATIVE mg/dL
Ketones, ur: NEGATIVE mg/dL
Nitrite: POSITIVE — AB
Protein, ur: 30 mg/dL — AB
Specific Gravity, Urine: 1.015 (ref 1.005–1.030)
WBC, UA: >50 WBC/hpf — ABNORMAL HIGH (ref 0–5)
pH: 5 (ref 5.0–8.0)

## 2021-04-03 LAB — GLUCOSE, CAPILLARY
Glucose-Capillary: 124 mg/dL — ABNORMAL HIGH (ref 70–99)
Glucose-Capillary: 104 mg/dL — ABNORMAL HIGH (ref 70–99)

## 2021-04-03 LAB — RAPID URINE DRUG SCREEN, HOSP PERFORMED
Amphetamines: NOT DETECTED
Barbiturates: NOT DETECTED
Benzodiazepines: POSITIVE — AB
Cocaine: NOT DETECTED
Opiates: NOT DETECTED
Tetrahydrocannabinol: POSITIVE — AB

## 2021-04-03 MED ORDER — LEVETIRACETAM ER 500 MG PO TB24: 500.0000 mg | ORAL_TABLET | Freq: Every day | ORAL | 0 refills | Status: DC

## 2021-04-03 MED ORDER — MUPIROCIN 2 % EX OINT: 1.0000 "application " | TOPICAL_OINTMENT | Freq: Two times a day (BID) | CUTANEOUS | Status: DC

## 2021-04-03 MED ORDER — OLANZAPINE 5 MG PO TBDP
2.5000 mg | ORAL_TABLET | Freq: Once | ORAL | Status: AC
  Administered 2021-04-03: 2.5 mg via ORAL
  Filled 2021-04-03: qty 0.5

## 2021-04-03 MED ORDER — LEVETIRACETAM ER 500 MG PO TB24
500.0000 mg | ORAL_TABLET | Freq: Every day | ORAL | Status: DC
  Administered 2021-04-03: 500 mg via ORAL
  Filled 2021-04-03: qty 1

## 2021-04-03 MED ORDER — ASPIRIN EC 81 MG PO TBEC
81.0000 mg | DELAYED_RELEASE_TABLET | Freq: Every day | ORAL | Status: DC
  Administered 2021-04-03: 81 mg via ORAL
  Filled 2021-04-03: qty 1

## 2021-04-03 MED ORDER — ASPIRIN 81 MG PO TBEC: 81.0000 mg | DELAYED_RELEASE_TABLET | Freq: Every day | ORAL | 11 refills | Status: AC

## 2021-04-03 MED ORDER — FOSFOMYCIN TROMETHAMINE 3 G PO PACK
3.0000 g | PACK | Freq: Once | ORAL | Status: AC
  Administered 2021-04-03: 3 g via ORAL
  Filled 2021-04-03: qty 3

## 2021-04-03 NOTE — Discharge Summary (Addendum)
Stroke Discharge Summary  Patient ID: Lori Bell   MRN: [id number]      DOB: 1945-10-10  Date of Admission: 04/01/2021 Date of Discharge: 04/03/2021  Attending Physician:  No att. providers found, Stroke MD Patient's PCP:  Wynn Banker, MD  DISCHARGE DIAGNOSIS: Stroke like symptoms s/p IV TPA in the setting of UTI  Principal Problem:   Stroke-like symptoms- dysarhria,left facial droop Active Problems:   Dementia due to Alzheimer's disease (HCC)   Mixed hyperlipidemia   Essential (primary) hypertension   Hypothyroidism, unspecified   UTI (urinary tract infection)   Hypokalemia   Allergies as of 04/03/2021       Reactions   Percocet [oxycodone-acetaminophen] Hives, Swelling   Risperidone Other (See Comments)   Made patient completely incapacitated    Seroquel [quetiapine Fumarate] Other (See Comments)   Neck pain   Simvastatin Other (See Comments)   Severe hip pain Tolerates pravastatin        Medication List     STOP taking these medications    fluticasone 50 MCG/ACT nasal spray Commonly known as: FLONASE   furosemide 20 MG tablet Commonly known as: LASIX   OLANZapine 5 MG tablet Commonly known as: ZyPREXA   pantoprazole 40 MG tablet Commonly known as: PROTONIX       TAKE these medications    acetaminophen 650 MG CR tablet Commonly known as: TYLENOL Take 650 mg by mouth daily as needed for pain.   alendronate 70 MG tablet Commonly known as: FOSAMAX TAKE 1 TABLET BY MOUTH ONCE WEEKLY. What changed: when to take this   ALPRAZolam 0.5 MG tablet Commonly known as: XANAX Take 1 tablet (0.5 mg total) by mouth 2 (two) times daily as needed for anxiety. What changed: how much to take   aspirin 81 MG EC tablet Take 1 tablet (81 mg total) by mouth daily. Swallow whole.   docusate sodium 100 MG capsule Commonly known as: COLACE Take 100-200 mg by mouth See admin instructions. Take 2 capsules (200mg ) by mouth in the morning and 1 capsule  (100mg ) by mouth at bedtime.   DULoxetine 60 MG capsule Commonly known as: CYMBALTA TAKE ONE CAPSULE BY MOUTH ONCE DAILY. What changed: when to take this   famotidine 20 MG tablet Commonly known as: PEPCID TAKE (1) TABLET BY MOUTH TWICE DAILY. What changed: See the new instructions.   irbesartan 150 MG tablet Commonly known as: Avapro Take 1 tablet (150 mg total) by mouth daily. What changed: when to take this   levETIRAcetam 500 MG 24 hr tablet Commonly known as: KEPPRA XR Take 1 tablet (500 mg total) by mouth daily. Start taking on: April 04, 2021   levothyroxine 25 MCG tablet Commonly known as: SYNTHROID TAKE ONE TABLET BY MOUTH ONCE DAILY BEFORE BREAKFAST. What changed: See the new instructions.   Melatonin 10 MG Caps Take 10 mg by mouth at bedtime as needed. What changed: when to take this   nitroGLYCERIN 0.4 MG SL tablet Commonly known as: NITROSTAT Place 1 tablet (0.4 mg total) under the tongue every 5 (five) minutes as needed for chest pain.   pravastatin 40 MG tablet Commonly known as: PRAVACHOL TAKE ONE TABLET BY MOUTH ONCE DAILY. What changed: when to take this   propranolol ER 80 MG 24 hr capsule Commonly known as: INDERAL LA TAKE (1) CAPSULE BY MOUTH AT BEDTIME. What changed: See the new instructions.  Discharge Care Instructions  (From admission, onward)           Start     Ordered   04/03/21 0000  No dressing needed        04/03/21 1418            LABORATORY STUDIES CBC    Component Value Date/Time   WBC 7.5 04/02/2021 0949   RBC 3.62 (L) 04/02/2021 0949   HGB 11.2 (L) 04/02/2021 0949   HCT 34.8 (L) 04/02/2021 0949   PLT 251 04/02/2021 0949   MCV 96.1 04/02/2021 0949   MCH 30.9 04/02/2021 0949   MCHC 32.2 04/02/2021 0949   RDW 13.8 04/02/2021 0949   LYMPHSABS 2.5 04/02/2021 0949   MONOABS 0.5 04/02/2021 0949   EOSABS 0.2 04/02/2021 0949   BASOSABS 0.0 04/02/2021 0949   CMP    Component Value  Date/Time   NA 140 04/02/2021 0949   NA 137 02/18/2019 0000   K 3.0 (L) 04/02/2021 0949   CL 105 04/02/2021 0949   CO2 24 04/02/2021 0949   GLUCOSE 118 (H) 04/02/2021 0949   BUN 10 04/02/2021 0949   BUN 14 02/18/2019 0000   CREATININE 1.12 (H) 04/02/2021 0949   CREATININE 1.14 (H) 05/26/2020 0756   CALCIUM 8.9 04/02/2021 0949   PROT 6.9 04/01/2021 2028   ALBUMIN 3.4 (L) 04/01/2021 2028   AST 12 (L) 04/01/2021 2028   ALT 13 04/01/2021 2028   ALKPHOS 75 04/01/2021 2028   BILITOT 0.6 04/01/2021 2028   GFRNONAA 51 (L) 04/02/2021 0949   GFRAA 61 02/18/2019 0000   COAGS Lab Results  Component Value Date   INR 1.0 04/01/2021   INR 0.93 06/10/2017   Lipid Panel    Component Value Date/Time   CHOL 176 04/02/2021 0949   TRIG 121 04/02/2021 0949   HDL 42 04/02/2021 0949   CHOLHDL 4.2 04/02/2021 0949   VLDL 24 04/02/2021 0949   LDLCALC 110 (H) 04/02/2021 0949   LDLCALC 111 (H) 05/26/2020 0756   HgbA1C  Lab Results  Component Value Date   HGBA1C 5.7 (H) 04/02/2021   Urinalysis    Component Value Date/Time   COLORURINE AMBER (A) 04/03/2021 1154   APPEARANCEUR CLOUDY (A) 04/03/2021 1154   LABSPEC 1.015 04/03/2021 1154   PHURINE 5.0 04/03/2021 1154   GLUCOSEU NEGATIVE 04/03/2021 1154   HGBUR SMALL (A) 04/03/2021 1154   BILIRUBINUR NEGATIVE 04/03/2021 1154   BILIRUBINUR 2+ 05/26/2020 0814   KETONESUR NEGATIVE 04/03/2021 1154   PROTEINUR 30 (A) 04/03/2021 1154   UROBILINOGEN 0.2 05/26/2020 0814   UROBILINOGEN 0.2 07/21/2013 2208   NITRITE POSITIVE (A) 04/03/2021 1154   LEUKOCYTESUR LARGE (A) 04/03/2021 1154   Urine Drug Screen     Component Value Date/Time   LABOPIA NONE DETECTED 04/03/2021 1154   COCAINSCRNUR NONE DETECTED 04/03/2021 1154   LABBENZ POSITIVE (A) 04/03/2021 1154   AMPHETMU NONE DETECTED 04/03/2021 1154   THCU POSITIVE (A) 04/03/2021 1154   LABBARB NONE DETECTED 04/03/2021 1154    Alcohol Level    Component Value Date/Time   ETH <10 04/01/2021  2028     SIGNIFICANT DIAGNOSTIC STUDIES CT ANGIO HEAD NECK W WO CM  Result Date: 04/01/2021 CLINICAL DATA:  Initial evaluation for neuro deficit, stroke suspected. EXAM: CT ANGIOGRAPHY HEAD AND NECK TECHNIQUE: Multidetector CT imaging of the head and neck was performed using the standard protocol during bolus administration of intravenous contrast. Multiplanar CT image reconstructions and MIPs were obtained to evaluate the vascular anatomy.  Carotid stenosis measurements (when applicable) are obtained utilizing NASCET criteria, using the distal internal carotid diameter as the denominator. CONTRAST:  100mL OMNIPAQUE IOHEXOL 350 MG/ML SOLN COMPARISON:  Head CT from earlier the same day. FINDINGS: CTA NECK FINDINGS Aortic arch: Visualized aortic arch normal in caliber with normal branch pattern. Mild atheromatous change about the arch and origin of the great vessels without hemodynamically significant stenosis. Right carotid system: Right CCA tortuous proximally but is widely patent to the bifurcation. Mild atheromatous change about the right carotid bulb without stenosis. Right ICA patent distally without stenosis, dissection or occlusion. Left carotid system: Left CCA tortuous proximally but is widely patent to the bifurcation without stenosis. Mild for age atheromatous change about the left carotid bulb without stenosis. Left ICA patent distally without stenosis, dissection or occlusion. Vertebral arteries: Both vertebral arteries arise from the subclavian arteries. No proximal subclavian artery stenosis. Atheromatous change at the origins of both vertebral arteries with associated moderate approximate 50% ostial stenoses. Vertebral artery tortuous proximally but are otherwise widely patent without stenosis, dissection or occlusion. Skeleton: No visible acute osseous finding. No discrete or worrisome osseous lesions. Degenerative changes noted about the left TMJ. Other neck: No other acute soft tissue  abnormality within the neck. No mass or adenopathy. Upper chest: Scattered interlobular septal thickening with ground-glass opacity within the visualized lungs, suggesting a degree of pulmonary interstitial edema. Review of the MIP images confirms the above findings CTA HEAD FINDINGS Anterior circulation: Both internal carotid arteries widely patent to the termini without stenosis. A1 segments widely patent. Normal anterior communicating artery complex. Anterior cerebral arteries patent to their distal aspects without stenosis. No M1 stenosis or occlusion. Normal MCA bifurcations. No proximal MCA branch occlusion. Distal MCA branches well perfused and symmetric. Posterior circulation: Both V4 segments patent to the vertebrobasilar junction without stenosis. Both PICA origins patent and normal. Basilar widely patent to its distal aspect without stenosis. Superior cerebellar arteries patent bilaterally. Both PCAs primarily supplied via the basilar and are well perfused to there distal aspects. Venous sinuses: Grossly patent allowing for timing the contrast bolus. Anatomic variants: None significant.  No aneurysm. Review of the MIP images confirms the above findings IMPRESSION: 1. Negative CTA for emergent large vessel occlusion. 2. Mild for age atheromatous change about the carotid bifurcations without hemodynamically significant stenosis. 3. Moderate approximate 50% stenoses at the origins of both vertebral arteries. Otherwise wide patency of the posterior circulation. 4. Diffuse tortuosity of the major arterial vasculature of the head and neck, suggesting chronic underlying hypertension. 5. Diffuse interlobular septal thickening with ground-glass opacity within the visualized lungs, suggesting a degree of pulmonary interstitial edema. Critical Value/emergent results were called by telephone at the time of interpretation on 04/01/2021 at 8:48 pm to [facility] , who verbally acknowledged these results.  Electronically Signed   By: Rise MuBenjamin  McClintock M.D.   On: 04/01/2021 21:10   CT HEAD WO CONTRAST (5MM)  Result Date: 04/02/2021 CLINICAL DATA:  Stroke follow-up EXAM: CT HEAD WITHOUT CONTRAST TECHNIQUE: Contiguous axial images were obtained from the base of the skull through the vertex without intravenous contrast. COMPARISON:  None. FINDINGS: Brain: There is no mass, hemorrhage or extra-axial collection. The size and configuration of the ventricles and extra-axial CSF spaces are normal. There is hypoattenuation of the white matter, most commonly indicating chronic small vessel disease. Vascular: No abnormal hyperdensity of the major intracranial arteries or dural venous sinuses. No intracranial atherosclerosis. Skull: The visualized skull base, calvarium and extracranial soft tissues  are normal. Sinuses/Orbits: Left maxillary and sphenoid sinus opacification. No fluid levels. Left mastoid effusion. Nasopharynx is clear. The orbits are normal. IMPRESSION: 1. Chronic small vessel disease without acute intracranial abnormality. 2. Left maxillary and sphenoid sinus disease and left mastoid effusion. Electronically Signed   By: Deatra Robinson M.D.   On: 04/02/2021 21:26   EEG adult  Result Date: 04/03/2021 Charlsie Quest, MD     04/03/2021 10:19 AM Patient Name: Lori Bell MRN: [id number] Epilepsy Attending: Charlsie Quest Referring Physician/Provider: Dr Brooke Dare Date: 04/03/2021 Duration: 25.21 mins Patient history: 75 year old woman with a past medical history significant for Altheimer's dementia, hypertension, hyperlipidemia, coronary artery disease, presenting with an episode of shortness of breath followed by loss of consciousness and left-sided facial droop with slowness to respond. EEG to evaluate for seizure. Level of alertness: Awake, asleep AEDs during EEG study: Xanax Technical aspects: This EEG study was done with scalp electrodes positioned according to the 10-20 International system  of electrode placement. Electrical activity was acquired at a sampling rate of  and reviewed with a high frequency filter of  and a low frequency filter of . EEG data were recorded continuously and digitally stored. Description: The posterior dominant rhythm consists of 7 Hz activity of moderate voltage (25-35 uV) seen predominantly in posterior head regions, symmetric and reactive to eye opening and eye closing. Sleep was characterized by vertex waves, sleep spindles (12 to 14 Hz), maximal frontocentral region. Hyperventilation and photic stimulation were not performed.   ABNORMALITY - Background slow IMPRESSION: This study is suggestive of mild diffuse encephalopathy, nonspecific to etiology. No seizures or epileptiform discharges were seen throughout the recording. Charlsie Quest   ECHOCARDIOGRAM COMPLETE  Result Date: 04/02/2021    ECHOCARDIOGRAM REPORT   Patient Name:   Lori Bell Date of Exam: 04/02/2021 Medical Rec #:  [id number]      Height:       65.0 in Accession #:    [id number]     Weight:       158.1 lb Date of Birth:  [birth year]       BSA:          1.790 m Patient Age:    75 years       BP:           152/92 mmHg Patient Gender: F              HR:           60 bpm. Exam Location:  Inpatient Procedure: 2D Echo, Cardiac Doppler and Color Doppler Indications:    TIA G45.9  History:        Patient has prior history of Echocardiogram examinations, most                 recent 04/15/2014. Risk Factors:Hypertension, Dyslipidemia and                 Non-Smoker.  Sonographer:    Renella Cunas RDCS Referring Phys: [id number] SRISHTI L BHAGAT IMPRESSIONS  1. Left ventricular ejection fraction, by estimation, is 60 to 65%. The left ventricle has normal function. The left ventricle has no regional wall motion abnormalities. Left ventricular diastolic parameters are consistent with Grade II diastolic dysfunction (pseudonormalization). Elevated left ventricular end-diastolic pressure. The E/e' is 23.6.  2.  Right ventricular systolic function is normal. The right ventricular size is normal. There is normal pulmonary artery systolic pressure. The estimated right ventricular systolic pressure is 31.5 mmHg.  3. Left atrial size was mildly dilated.  4. The mitral valve is grossly normal. Trivial mitral valve regurgitation. No evidence of mitral stenosis.  5. The aortic valve is tricuspid. There is mild calcification of the aortic valve. Aortic valve regurgitation is mild. Mild aortic valve sclerosis is present, with no evidence of aortic valve stenosis.  6. The inferior vena cava is normal in size with greater than 50% respiratory variability, suggesting right atrial pressure of 3 mmHg. Conclusion(s)/Recommendation(s): No intracardiac source of embolism detected on this transthoracic study. A transesophageal echocardiogram is recommended to exclude cardiac source of embolism if clinically indicated. FINDINGS  Left Ventricle: Left ventricular ejection fraction, by estimation, is 60 to 65%. The left ventricle has normal function. The left ventricle has no regional wall motion abnormalities. The left ventricular internal cavity size was normal in size. There is  no left ventricular hypertrophy. Left ventricular diastolic parameters are consistent with Grade II diastolic dysfunction (pseudonormalization). Elevated left ventricular end-diastolic pressure. The E/e' is 23.6. Right Ventricle: The right ventricular size is normal. No increase in right ventricular wall thickness. Right ventricular systolic function is normal. There is normal pulmonary artery systolic pressure. The tricuspid regurgitant velocity is 2.67 m/s, and  with an assumed right atrial pressure of 3 mmHg, the estimated right ventricular systolic pressure is 31.5 mmHg. Left Atrium: Left atrial size was mildly dilated. Right Atrium: Right atrial size was normal in size. Pericardium: Trivial pericardial effusion is present. Presence of pericardial fat pad. Mitral  Valve: The mitral valve is grossly normal. Mild mitral annular calcification. Trivial mitral valve regurgitation. No evidence of mitral valve stenosis. Tricuspid Valve: The tricuspid valve is grossly normal. Tricuspid valve regurgitation is trivial. No evidence of tricuspid stenosis. Aortic Valve: The aortic valve is tricuspid. There is mild calcification of the aortic valve. Aortic valve regurgitation is mild. Mild aortic valve sclerosis is present, with no evidence of aortic valve stenosis. Pulmonic Valve: The pulmonic valve was grossly normal. Pulmonic valve regurgitation is mild. No evidence of pulmonic stenosis. Aorta: The aortic root and ascending aorta are structurally normal, with no evidence of dilitation. Venous: The inferior vena cava is normal in size with greater than 50% respiratory variability, suggesting right atrial pressure of 3 mmHg. IAS/Shunts: The atrial septum is grossly normal.  LEFT VENTRICLE PLAX 2D LVIDd:         3.80 cm     Diastology LVIDs:         2.70 cm     LV e' medial:    5.46 cm/s LV PW:         1.00 cm     LV E/e' medial:  23.6 LV IVS:        1.00 cm     LV e' lateral:   5.51 cm/s LVOT diam:     1.90 cm     LV E/e' lateral: 23.4 LV SV:         62 LV SV Index:   35 LVOT Area:     2.84 cm  LV Volumes (MOD) LV vol d, MOD A2C: 72.5 ml LV vol d, MOD A4C: 68.2 ml LV vol s, MOD A2C: 31.8 ml LV vol s, MOD A4C: 26.3 ml LV SV MOD A2C:     40.7 ml LV SV MOD A4C:     68.2 ml LV SV MOD BP:      41.0 ml RIGHT VENTRICLE RV S prime:     11.90 cm/s TAPSE (M-mode): 2.1 cm LEFT ATRIUM  Index       RIGHT ATRIUM           Index LA diam:        3.40 cm 1.90 cm/m  RA Area:     16.50 cm LA Vol (A2C):   42.4 ml 23.69 ml/m RA Volume:   48.90 ml  27.32 ml/m LA Vol (A4C):   75.7 ml 42.29 ml/m LA Biplane Vol: 57.0 ml 31.85 ml/m  AORTIC VALVE LVOT Vmax:   73.20 cm/s LVOT Vmean:  55.200 cm/s LVOT VTI:    0.219 m  AORTA Ao Root diam: 3.20 cm Ao Asc diam:  3.50 cm MITRAL VALVE                 TRICUSPID VALVE MV Area (PHT): 4.74 cm     TR Peak grad:   28.5 mmHg MV Decel Time: 160 msec     TR Vmax:        267.00 cm/s MV E velocity: 129.00 cm/s MV A velocity: 71.80 cm/s   SHUNTS MV E/A ratio:  1.80         Systemic VTI:  0.22 m                             Systemic Diam: 1.90 cm Lennie Odor MD Electronically signed by Lennie Odor MD Signature Date/Time: 04/02/2021/9:49:27 AM    Final    CT HEAD CODE STROKE WO CONTRAST  Result Date: 04/01/2021 CLINICAL DATA:  Code stroke. Initial evaluation for acute stroke, left facial droop. EXAM: CT HEAD WITHOUT CONTRAST TECHNIQUE: Contiguous axial images were obtained from the base of the skull through the vertex without intravenous contrast. COMPARISON:  CT from 10/08/2020. FINDINGS: Brain: Generalized age-related cerebral atrophy with chronic small vessel ischemic disease. No acute intracranial hemorrhage. No acute large vessel territory infarct. No mass lesion, midline shift or mass effect. No hydrocephalus or extra-axial fluid collection. Vascular: No hyperdense vessel. Scattered vascular calcifications noted within the carotid siphons. Skull: Scalp soft tissues and calvarium within normal limits. Sinuses/Orbits: Globes and orbital soft tissues within normal limits. Chronic left sphenoid and maxillary sinusitis. Right sphenoid sinus retention cyst. Chronic appearing left mastoid effusion. Negative nasopharynx. Other: None. ASPECTS [facility] Stroke Program Early CT Score) - Ganglionic level infarction (caudate, lentiform nuclei, internal capsule, insula, M1-M3 cortex): 7 - Supraganglionic infarction (M4-M6 cortex): 3 Total score (0-10 with 10 being normal): 10 IMPRESSION: 1. No acute intracranial infarct or other abnormality. 2. ASPECTS is 10. 3. Age-related cerebral atrophy with mild chronic small vessel ischemic disease. 4. Chronic left maxillary and sphenoid sinusitis. 5. Left mastoid effusion, also chronic in appearance. Critical Value/emergent results were  called by telephone at the time of interpretation on 04/01/2021 at 8:48 pm to provider [facility] , who verbally acknowledged these results. Electronically Signed   By: Rise Mu M.D.   On: 04/01/2021 20:54      HISTORY OF PRESENT ILLNESS  Lori Bell is a 75 y.o. female with a past medical history significant for Alzheimer's dementia with behavioral disturbance (FAST 5), hypertension, hyperlipidemia, coronary artery disease, GERD, depression, osteoporosis, ischemic colitis, GI bleed (2018) hypothyroidism, presents to the ED for sudden onset of Left facial droop and slowness to respond for 20 minutes. Of note she had a similar event on Monday lasting 23 minutes erect, not limp (just not responsive to talking, touching or rubbing her face). This started in November.  Family notes the patient is  always short of breath with these episodes, and to date she has had 5-6 episodes total, lasting anywhere from 10-30 minutes.   LKW 04/01/2021, @ 1925. Patient was seen by tele-neurology and was deemed appropriate for IV TPA. NIHSS 3  HOSPITAL COURSE Patient was admitted to [facility] ED on 04/02/2021. CT H revealed no acute intracranial infarct or other abnormality within aspects of 10, significant atrophy and sufficient chronic small vessel ischemic disease to obscure potential acute infarct CTA H negative for large vessel occlusion with fairly mild atheromatous changes throughout, no clinically significant stenoses.  24 h CTH revealed  negative for hemorrhage. Chronic small vessel disease without acute intracranial abnormality. 2. Left maxillary and sphenoid sinus disease and left mastoid effusion.  2D echo EF 60-65%, Left ventricular diastolic parameters are consistent with Grade II diastolic, no shunt.   LDL 110, continue pravastatin 40 mg daily   HGBA1c 5.7%.   Due to multiple similar events routine EEG revealed no seizures, however opted to start Keppra  XR daily. UA revealed UTI with positive  nitrates and leuks- fosfomycin x 1 given prior to discharge During hospitalization she did experience some agitation overnight which improved with olanzapine.    RN Pressure Injury Documentation: Pressure Injury 04/02/21 Vertebral column Medial Deep Tissue Pressure Injury - Purple or maroon localized area of discolored intact skin or blood-filled blister due to damage of underlying soft tissue from pressure and/or shear. (Active)  04/02/21 0645  Location: Vertebral column  Location Orientation: Medial  Staging: Deep Tissue Pressure Injury - Purple or maroon localized area of discolored intact skin or blood-filled blister due to damage of underlying soft tissue from pressure and/or shear.  Wound Description (Comments):   Present on Admission: Yes     DISCHARGE EXAM Blood pressure 139/70, pulse 76, temperature 98 F (36.7 C), temperature source Oral, resp. rate 16, height  (1.651 m), weight 71.7 kg, SpO2 95 %.  Physical Exam  Constitutional: Appears well-developed and well-nourished.  Psych: Affect appropriate to situation,  Eyes: No scleral injection HENT: No oropharyngeal obstruction.  MSK: no joint deformities.  Cardiovascular: Normal rate and regular rhythm.  Respiratory: Effort normal, non-labored breathing GI: Soft.  No distension. There is no tenderness.  Skin: Warm dry and intact visible skin   Neuro: Mental Status: Patient is awake, alert, oriented to person only Patient is able to identify her daughter at the bedside but otherwise gives very limited history Mild difficulty with complex repetition and naming and low-frequency objects Cranial Nerves: II: Visual Fields are full. Pupils are equal, round, and reactive to light.  III,IV, VI: EOMI with mild left eye ptosis.  Saccadic pursuits V: Facial sensation is symmetric to temperature VII: Facial movement is notable for subtle left facial droop.  VIII: hearing is intact to voice X: Uvula elevates symmetrically XI:  Shoulder shrug is symmetric. XII: tongue is midline without atrophy or fasciculations.  Motor: Tone is normal. Bulk is normal.  Confrontational strength testing is limited secondary to her dementia. Moves all extremities  Sensory: Grossly symmetric to light noxious stimulation (tickle).  Does not reliably report left and right Deep Tendon Reflexes: 3+ and symmetric in the biceps and patellae.  Cerebellar: FNF and HKS are intact bilaterally  Discharge Diet       Diet   DIET DYS 2 Room service appropriate? Yes; Fluid consistency: Thin   liquids  DISCHARGE PLAN Disposition:  home with PT/OT aspirin 81 mg daily for secondary stroke prevention Ongoing stroke risk factor control by  Primary Care Physician at time of discharge Follow-up PCP Wynn Banker, MD in 2 weeks. Follow-up in [facility] in 4 weeks, office to schedule an appointment.   45 minutes were spent preparing discharge.  Gevena Mart, NP  I have personally obtained history,examined this patient, reviewed notes, independently viewed imaging studies, participated in medical decision making and plan of care.ROS completed by me personally and pertinent positives fully documented  I have made any additions or clarifications directly to the above note. Agree with note above.    Delia Heady, MD Medical Director [facility] Pager: [phone] 04/03/2021 4:03 PM

## 2021-04-03 NOTE — H&P (Deleted)
Stroke Discharge Summary  Patient ID: Lori Bell   MRN: [id number]      DOB: Jun 03, 1946  Date of Admission: 04/01/2021 Date of Discharge: 04/03/2021  Attending Physician:  Harolyn Rutherford, MD, Stroke MD Patient's PCP:  Wynn Banker, MD  DISCHARGE DIAGNOSIS: Stroke like symptoms s/p IV TPA in the setting of UTI  Principal Problem:   Stroke-like symptom Active Problems:   Dementia due to Alzheimer's disease (HCC)   Mixed hyperlipidemia   Essential (primary) hypertension   Hypothyroidism, unspecified   UTI (urinary tract infection)   Allergies as of 04/03/2021       Reactions   Percocet [oxycodone-acetaminophen] Hives, Swelling   Risperidone Other (See Comments)   Made patient completely incapacitated    Seroquel [quetiapine Fumarate] Other (See Comments)   Neck pain   Simvastatin Other (See Comments)   Severe hip pain Tolerates pravastatin        Medication List     STOP taking these medications    fluticasone 50 MCG/ACT nasal spray Commonly known as: FLONASE   furosemide 20 MG tablet Commonly known as: LASIX   OLANZapine 5 MG tablet Commonly known as: ZyPREXA   pantoprazole 40 MG tablet Commonly known as: PROTONIX       TAKE these medications    acetaminophen 650 MG CR tablet Commonly known as: TYLENOL Take 650 mg by mouth daily as needed for pain.   alendronate 70 MG tablet Commonly known as: FOSAMAX TAKE 1 TABLET BY MOUTH ONCE WEEKLY. What changed: when to take this   ALPRAZolam 0.5 MG tablet Commonly known as: XANAX Take 1 tablet (0.5 mg total) by mouth 2 (two) times daily as needed for anxiety. What changed: how much to take   aspirin 81 MG EC tablet Take 1 tablet (81 mg total) by mouth daily. Swallow whole.   docusate sodium 100 MG capsule Commonly known as: COLACE Take 100-200 mg by mouth See admin instructions. Take 2 capsules ( ) by mouth in the morning and 1 capsule ( ) by mouth at bedtime.   DULoxetine 60 MG  capsule Commonly known as: CYMBALTA TAKE ONE CAPSULE BY MOUTH ONCE DAILY. What changed: when to take this   famotidine 20 MG tablet Commonly known as: PEPCID TAKE (1) TABLET BY MOUTH TWICE DAILY. What changed: See the new instructions.   irbesartan 150 MG tablet Commonly known as: Avapro Take 1 tablet (150 mg total) by mouth daily. What changed: when to take this   levETIRAcetam 500 MG 24 hr tablet Commonly known as: KEPPRA XR Take 1 tablet (500 mg total) by mouth daily. Start taking on: April 04, 2021   levothyroxine 25 MCG tablet Commonly known as: SYNTHROID TAKE ONE TABLET BY MOUTH ONCE DAILY BEFORE BREAKFAST. What changed: See the new instructions.   Melatonin 10 MG Caps Take 10 mg by mouth at bedtime as needed. What changed: when to take this   nitroGLYCERIN 0.4 MG SL tablet Commonly known as: NITROSTAT Place 1 tablet (0.4 mg total) under the tongue every 5 (five) minutes as needed for chest pain.   pravastatin 40 MG tablet Commonly known as: PRAVACHOL TAKE ONE TABLET BY MOUTH ONCE DAILY. What changed: when to take this   propranolol ER 80 MG 24 hr capsule Commonly known as: INDERAL LA TAKE (1) CAPSULE BY MOUTH AT BEDTIME. What changed: See the new instructions.               Discharge Care Instructions  (From  admission, onward)           Start     Ordered   04/03/21 0000  No dressing needed        04/03/21 1418            LABORATORY STUDIES CBC    Component Value Date/Time   WBC 7.5 04/02/2021 0949   RBC 3.62 (L) 04/02/2021 0949   HGB 11.2 (L) 04/02/2021 0949   HCT 34.8 (L) 04/02/2021 0949   PLT 251 04/02/2021 0949   MCV 96.1 04/02/2021 0949   MCH 30.9 04/02/2021 0949   MCHC 32.2 04/02/2021 0949   RDW 13.8 04/02/2021 0949   LYMPHSABS 2.5 04/02/2021 0949   MONOABS 0.5 04/02/2021 0949   EOSABS 0.2 04/02/2021 0949   BASOSABS 0.0 04/02/2021 0949   CMP    Component Value Date/Time   NA 140 04/02/2021 0949   NA 137 02/18/2019  0000   K 3.0 (L) 04/02/2021 0949   CL 105 04/02/2021 0949   CO2 24 04/02/2021 0949   GLUCOSE 118 (H) 04/02/2021 0949   BUN 10 04/02/2021 0949   BUN 14 02/18/2019 0000   CREATININE 1.12 (H) 04/02/2021 0949   CREATININE 1.14 (H) 05/26/2020 0756   CALCIUM 8.9 04/02/2021 0949   PROT 6.9 04/01/2021 2028   ALBUMIN 3.4 (L) 04/01/2021 2028   AST 12 (L) 04/01/2021 2028   ALT 13 04/01/2021 2028   ALKPHOS 75 04/01/2021 2028   BILITOT 0.6 04/01/2021 2028   GFRNONAA 51 (L) 04/02/2021 0949   GFRAA 61 02/18/2019 0000   COAGS Lab Results  Component Value Date   INR 1.0 04/01/2021   INR 0.93 06/10/2017   Lipid Panel    Component Value Date/Time   CHOL 176 04/02/2021 0949   TRIG 121 04/02/2021 0949   HDL 42 04/02/2021 0949   CHOLHDL 4.2 04/02/2021 0949   VLDL 24 04/02/2021 0949   LDLCALC 110 (H) 04/02/2021 0949   LDLCALC 111 (H) 05/26/2020 0756   HgbA1C  Lab Results  Component Value Date   HGBA1C 5.7 (H) 04/02/2021   Urinalysis    Component Value Date/Time   COLORURINE AMBER (A) 04/03/2021 1154   APPEARANCEUR CLOUDY (A) 04/03/2021 1154   LABSPEC 1.015 04/03/2021 1154   PHURINE 5.0 04/03/2021 1154   GLUCOSEU NEGATIVE 04/03/2021 1154   HGBUR SMALL (A) 04/03/2021 1154   BILIRUBINUR NEGATIVE 04/03/2021 1154   BILIRUBINUR 2+ 05/26/2020 0814   KETONESUR NEGATIVE 04/03/2021 1154   PROTEINUR 30 (A) 04/03/2021 1154   UROBILINOGEN 0.2 05/26/2020 0814   UROBILINOGEN 0.2 07/21/2013 2208   NITRITE POSITIVE (A) 04/03/2021 1154   LEUKOCYTESUR LARGE (A) 04/03/2021 1154   Urine Drug Screen     Component Value Date/Time   LABOPIA NONE DETECTED 04/03/2021 1154   COCAINSCRNUR NONE DETECTED 04/03/2021 1154   LABBENZ POSITIVE (A) 04/03/2021 1154   AMPHETMU NONE DETECTED 04/03/2021 1154   THCU POSITIVE (A) 04/03/2021 1154   LABBARB NONE DETECTED 04/03/2021 1154    Alcohol Level    Component Value Date/Time   ETH <10 04/01/2021 2028     SIGNIFICANT DIAGNOSTIC STUDIES CT ANGIO HEAD  NECK W WO CM  Result Date: 04/01/2021 CLINICAL DATA:  Initial evaluation for neuro deficit, stroke suspected. EXAM: CT ANGIOGRAPHY HEAD AND NECK TECHNIQUE: Multidetector CT imaging of the head and neck was performed using the standard protocol during bolus administration of intravenous contrast. Multiplanar CT image reconstructions and MIPs were obtained to evaluate the vascular anatomy. Carotid stenosis measurements (when applicable)  are obtained utilizing NASCET criteria, using the distal internal carotid diameter as the denominator. CONTRAST:  OMNIPAQUE IOHEXOL 350 MG/ML SOLN COMPARISON:  Head CT from earlier the same day. FINDINGS: CTA NECK FINDINGS Aortic arch: Visualized aortic arch normal in caliber with normal branch pattern. Mild atheromatous change about the arch and origin of the great vessels without hemodynamically significant stenosis. Right carotid system: Right CCA tortuous proximally but is widely patent to the bifurcation. Mild atheromatous change about the right carotid bulb without stenosis. Right ICA patent distally without stenosis, dissection or occlusion. Left carotid system: Left CCA tortuous proximally but is widely patent to the bifurcation without stenosis. Mild for age atheromatous change about the left carotid bulb without stenosis. Left ICA patent distally without stenosis, dissection or occlusion. Vertebral arteries: Both vertebral arteries arise from the subclavian arteries. No proximal subclavian artery stenosis. Atheromatous change at the origins of both vertebral arteries with associated moderate approximate 50% ostial stenoses. Vertebral artery tortuous proximally but are otherwise widely patent without stenosis, dissection or occlusion. Skeleton: No visible acute osseous finding. No discrete or worrisome osseous lesions. Degenerative changes noted about the left TMJ. Other neck: No other acute soft tissue abnormality within the neck. No mass or adenopathy. Upper chest:  Scattered interlobular septal thickening with ground-glass opacity within the visualized lungs, suggesting a degree of pulmonary interstitial edema. Review of the MIP images confirms the above findings CTA HEAD FINDINGS Anterior circulation: Both internal carotid arteries widely patent to the termini without stenosis. A1 segments widely patent. Normal anterior communicating artery complex. Anterior cerebral arteries patent to their distal aspects without stenosis. No M1 stenosis or occlusion. Normal MCA bifurcations. No proximal MCA branch occlusion. Distal MCA branches well perfused and symmetric. Posterior circulation: Both V4 segments patent to the vertebrobasilar junction without stenosis. Both PICA origins patent and normal. Basilar widely patent to its distal aspect without stenosis. Superior cerebellar arteries patent bilaterally. Both PCAs primarily supplied via the basilar and are well perfused to there distal aspects. Venous sinuses: Grossly patent allowing for timing the contrast bolus. Anatomic variants: None significant.  No aneurysm. Review of the MIP images confirms the above findings IMPRESSION: 1. Negative CTA for emergent large vessel occlusion. 2. Mild for age atheromatous change about the carotid bifurcations without hemodynamically significant stenosis. 3. Moderate approximate 50% stenoses at the origins of both vertebral arteries. Otherwise wide patency of the posterior circulation. 4. Diffuse tortuosity of the major arterial vasculature of the head and neck, suggesting chronic underlying hypertension. 5. Diffuse interlobular septal thickening with ground-glass opacity within the visualized lungs, suggesting a degree of pulmonary interstitial edema. Critical Value/emergent results were called by telephone at the time of interpretation on 04/01/2021 at 8:48 pm to provider [facility] , who verbally acknowledged these results. Electronically Signed   By: Rise Mu M.D.   On: 04/01/2021  21:10   CT HEAD WO CONTRAST ( )  Result Date: 04/02/2021 CLINICAL DATA:  Stroke follow-up EXAM: CT HEAD WITHOUT CONTRAST TECHNIQUE: Contiguous axial images were obtained from the base of the skull through the vertex without intravenous contrast. COMPARISON:  None. FINDINGS: Brain: There is no mass, hemorrhage or extra-axial collection. The size and configuration of the ventricles and extra-axial CSF spaces are normal. There is hypoattenuation of the white matter, most commonly indicating chronic small vessel disease. Vascular: No abnormal hyperdensity of the major intracranial arteries or dural venous sinuses. No intracranial atherosclerosis. Skull: The visualized skull base, calvarium and extracranial soft tissues are normal. Sinuses/Orbits: Left maxillary  and sphenoid sinus opacification. No fluid levels. Left mastoid effusion. Nasopharynx is clear. The orbits are normal. IMPRESSION: 1. Chronic small vessel disease without acute intracranial abnormality. 2. Left maxillary and sphenoid sinus disease and left mastoid effusion. Electronically Signed   By: Deatra RobinsonKevin  Herman M.D.   On: 04/02/2021 21:26   EEG adult  Result Date: 04/03/2021 Charlsie QuestYadav, Priyanka O, MD     04/03/2021 10:19 AM Patient Name: Noreene LarssonVernie T Bugarin MRN: [id number] Epilepsy Attending: Charlsie QuestPriyanka O Yadav Referring Physician/Provider: Dr Brooke DareSrishti Bhagat Date: 04/03/2021 Duration: 25.21 mins Patient history: 75 year old woman with a past medical history significant for Altheimer's dementia, hypertension, hyperlipidemia, coronary artery disease, presenting with an episode of shortness of breath followed by loss of consciousness and left-sided facial droop with slowness to respond. EEG to evaluate for seizure. Level of alertness: Awake, asleep AEDs during EEG study: Xanax Technical aspects: This EEG study was done with scalp electrodes positioned according to the 10-20 International system of electrode placement. Electrical activity was acquired at a sampling  rate of 500Hz  and reviewed with a high frequency filter of 70Hz  and a low frequency filter of 1Hz . EEG data were recorded continuously and digitally stored. Description: The posterior dominant rhythm consists of 7 Hz activity of moderate voltage (25-35 uV) seen predominantly in posterior head regions, symmetric and reactive to eye opening and eye closing. Sleep was characterized by vertex waves, sleep spindles (12 to 14 Hz), maximal frontocentral region. Hyperventilation and photic stimulation were not performed.   ABNORMALITY - Background slow IMPRESSION: This study is suggestive of mild diffuse encephalopathy, nonspecific to etiology. No seizures or epileptiform discharges were seen throughout the recording. Charlsie Questriyanka O Yadav   ECHOCARDIOGRAM COMPLETE  Result Date: 04/02/2021    ECHOCARDIOGRAM REPORT   Patient Name:   Noreene LarssonVERNIE T Walthers Date of Exam: 04/02/2021 Medical Rec #:  [id number]      Height:       65.0 in Accession #:    [id number]     Weight:       158.1 lb Date of Birth:  07/24/1946       BSA:          1.790 m Patient Age:    75 years       BP:           152/92 mmHg Patient Gender: F              HR:           60 bpm. Exam Location:  Inpatient Procedure: 2D Echo, Cardiac Doppler and Color Doppler Indications:    TIA G45.9  History:        Patient has prior history of Echocardiogram examinations, most                 recent 04/15/2014. Risk Factors:Hypertension, Dyslipidemia and                 Non-Smoker.  Sonographer:    Renella CunasJulia Swaim RDCS Referring Phys: [id number] SRISHTI L BHAGAT IMPRESSIONS  1. Left ventricular ejection fraction, by estimation, is 60 to 65%. The left ventricle has normal function. The left ventricle has no regional wall motion abnormalities. Left ventricular diastolic parameters are consistent with Grade II diastolic dysfunction (pseudonormalization). Elevated left ventricular end-diastolic pressure. The E/e' is 23.6.  2. Right ventricular systolic function is normal. The right ventricular  size is normal. There is normal pulmonary artery systolic pressure. The estimated right ventricular systolic pressure is 31.5 mmHg.  3. Left atrial size  was mildly dilated.  4. The mitral valve is grossly normal. Trivial mitral valve regurgitation. No evidence of mitral stenosis.  5. The aortic valve is tricuspid. There is mild calcification of the aortic valve. Aortic valve regurgitation is mild. Mild aortic valve sclerosis is present, with no evidence of aortic valve stenosis.  6. The inferior vena cava is normal in size with greater than 50% respiratory variability, suggesting right atrial pressure of 3 mmHg. Conclusion(s)/Recommendation(s): No intracardiac source of embolism detected on this transthoracic study. A transesophageal echocardiogram is recommended to exclude cardiac source of embolism if clinically indicated. FINDINGS  Left Ventricle: Left ventricular ejection fraction, by estimation, is 60 to 65%. The left ventricle has normal function. The left ventricle has no regional wall motion abnormalities. The left ventricular internal cavity size was normal in size. There is  no left ventricular hypertrophy. Left ventricular diastolic parameters are consistent with Grade II diastolic dysfunction (pseudonormalization). Elevated left ventricular end-diastolic pressure. The E/e' is 23.6. Right Ventricle: The right ventricular size is normal. No increase in right ventricular wall thickness. Right ventricular systolic function is normal. There is normal pulmonary artery systolic pressure. The tricuspid regurgitant velocity is 2.67 m/s, and  with an assumed right atrial pressure of 3 mmHg, the estimated right ventricular systolic pressure is 31.5 mmHg. Left Atrium: Left atrial size was mildly dilated. Right Atrium: Right atrial size was normal in size. Pericardium: Trivial pericardial effusion is present. Presence of pericardial fat pad. Mitral Valve: The mitral valve is grossly normal. Mild mitral annular  calcification. Trivial mitral valve regurgitation. No evidence of mitral valve stenosis. Tricuspid Valve: The tricuspid valve is grossly normal. Tricuspid valve regurgitation is trivial. No evidence of tricuspid stenosis. Aortic Valve: The aortic valve is tricuspid. There is mild calcification of the aortic valve. Aortic valve regurgitation is mild. Mild aortic valve sclerosis is present, with no evidence of aortic valve stenosis. Pulmonic Valve: The pulmonic valve was grossly normal. Pulmonic valve regurgitation is mild. No evidence of pulmonic stenosis. Aorta: The aortic root and ascending aorta are structurally normal, with no evidence of dilitation. Venous: The inferior vena cava is normal in size with greater than 50% respiratory variability, suggesting right atrial pressure of 3 mmHg. IAS/Shunts: The atrial septum is grossly normal.  LEFT VENTRICLE PLAX 2D LVIDd:         3.80 cm     Diastology LVIDs:         2.70 cm     LV e' medial:    5.46 cm/s LV PW:         1.00 cm     LV E/e' medial:  23.6 LV IVS:        1.00 cm     LV e' lateral:   5.51 cm/s LVOT diam:     1.90 cm     LV E/e' lateral: 23.4 LV SV:         62 LV SV Index:   35 LVOT Area:     2.84 cm  LV Volumes (MOD) LV vol d, MOD A2C: 72.5 ml LV vol d, MOD A4C: 68.2 ml LV vol s, MOD A2C: 31.8 ml LV vol s, MOD A4C: 26.3 ml LV SV MOD A2C:     40.7 ml LV SV MOD A4C:     68.2 ml LV SV MOD BP:      41.0 ml RIGHT VENTRICLE RV S prime:     11.90 cm/s TAPSE (M-mode): 2.1 cm LEFT ATRIUM  Index       RIGHT ATRIUM           Index LA diam:        3.40 cm 1.90 cm/m  RA Area:     16.50 cm LA Vol (A2C):   42.4 ml 23.69 ml/m RA Volume:   48.90 ml  27.32 ml/m LA Vol (A4C):   75.7 ml 42.29 ml/m LA Biplane Vol: 57.0 ml 31.85 ml/m  AORTIC VALVE LVOT Vmax:   73.20 cm/s LVOT Vmean:  55.200 cm/s LVOT VTI:    0.219 m  AORTA Ao Root diam: 3.20 cm Ao Asc diam:  3.50 cm MITRAL VALVE                TRICUSPID VALVE MV Area (PHT): 4.74 cm     TR Peak grad:   28.5  mmHg MV Decel Time: 160 msec     TR Vmax:        267.00 cm/s MV E velocity: 129.00 cm/s MV A velocity: 71.80 cm/s   SHUNTS MV E/A ratio:  1.80         Systemic VTI:  0.22 m                             Systemic Diam: 1.90 cm Lennie Odor MD Electronically signed by Lennie Odor MD Signature Date/Time: 04/02/2021/9:49:27 AM    Final    CT HEAD CODE STROKE WO CONTRAST  Result Date: 04/01/2021 CLINICAL DATA:  Code stroke. Initial evaluation for acute stroke, left facial droop. EXAM: CT HEAD WITHOUT CONTRAST TECHNIQUE: Contiguous axial images were obtained from the base of the skull through the vertex without intravenous contrast. COMPARISON:  CT from 10/08/2020. FINDINGS: Brain: Generalized age-related cerebral atrophy with chronic small vessel ischemic disease. No acute intracranial hemorrhage. No acute large vessel territory infarct. No mass lesion, midline shift or mass effect. No hydrocephalus or extra-axial fluid collection. Vascular: No hyperdense vessel. Scattered vascular calcifications noted within the carotid siphons. Skull: Scalp soft tissues and calvarium within normal limits. Sinuses/Orbits: Globes and orbital soft tissues within normal limits. Chronic left sphenoid and maxillary sinusitis. Right sphenoid sinus retention cyst. Chronic appearing left mastoid effusion. Negative nasopharynx. Other: None. ASPECTS [facility] Stroke Program Early CT Score) - Ganglionic level infarction (caudate, lentiform nuclei, internal capsule, insula, M1-M3 cortex): 7 - Supraganglionic infarction (M4-M6 cortex): 3 Total score (0-10 with 10 being normal): 10 IMPRESSION: 1. No acute intracranial infarct or other abnormality. 2. ASPECTS is 10. 3. Age-related cerebral atrophy with mild chronic small vessel ischemic disease. 4. Chronic left maxillary and sphenoid sinusitis. 5. Left mastoid effusion, also chronic in appearance. Critical Value/emergent results were called by telephone at the time of interpretation on 04/01/2021 at  8:48 pm to provider [facility] , who verbally acknowledged these results. Electronically Signed   By: Rise Mu M.D.   On: 04/01/2021 20:54      HISTORY OF PRESENT ILLNESS  MCKYNZI CAMMON is a 75 y.o. female with a past medical history significant for Alzheimer's dementia with behavioral disturbance (FAST 5), hypertension, hyperlipidemia, coronary artery disease, GERD, depression, osteoporosis, ischemic colitis, GI bleed (2018) hypothyroidism, presents to the ED for sudden onset of Left facial droop and slowness to respond for 20 minutes. Of note she had a similar event on Monday lasting 23 minutes erect, not limp (just not responsive to talking, touching or rubbing her face). This started in November.  Family notes the patient is  always short of breath with these episodes, and to date she has had 5-6 episodes total, lasting anywhere from 10-30 minutes.   LKW 04/01/2021, @ 1925. Patient was seen by tele-neurology and was deemed appropriate for IV TPA. NIHSS 3  HOSPITAL COURSE Patient was admitted to [facility] ED on 04/02/2021. CT H revealed no acute intracranial infarct or other abnormality within aspects of 10, significant atrophy and sufficient chronic small vessel ischemic disease to obscure potential acute infarct CTA H negative for large vessel occlusion with fairly mild atheromatous changes throughout, no clinically significant stenoses.  24 h CTH revealed  negative for hemorrhage. Chronic small vessel disease without acute intracranial abnormality. 2. Left maxillary and sphenoid sinus disease and left mastoid effusion.  2D echo EF 60-65%, Left ventricular diastolic parameters are consistent with Grade II diastolic, no shunt.   LDL 110, continue pravastatin 40 mg daily   HGBA1c 5.7%.   Due to multiple similar events routine EEG revealed no seizures, however opted to start Keppra  XR daily. UA revealed UTI with positive nitrates and leuks- fosfomycin x 1 given prior to  discharge During hospitalization she did experience some agitation overnight which improved with olanzapine.    RN Pressure Injury Documentation: Pressure Injury 04/02/21 Vertebral column Medial Deep Tissue Pressure Injury - Purple or maroon localized area of discolored intact skin or blood-filled blister due to damage of underlying soft tissue from pressure and/or shear. (Active)  04/02/21 0645  Location: Vertebral column  Location Orientation: Medial  Staging: Deep Tissue Pressure Injury - Purple or maroon localized area of discolored intact skin or blood-filled blister due to damage of underlying soft tissue from pressure and/or shear.  Wound Description (Comments):   Present on Admission: Yes     DISCHARGE EXAM Blood pressure 135/73, pulse 76, temperature 98 F (36.7 C), temperature source Oral, resp. rate (!) 25, height  (1.651 m), weight 71.7 kg, SpO2 95 %.  Physical Exam  Constitutional: Appears well-developed and well-nourished.  Psych: Affect appropriate to situation,  Eyes: No scleral injection HENT: No oropharyngeal obstruction.  MSK: no joint deformities.  Cardiovascular: Normal rate and regular rhythm.  Respiratory: Effort normal, non-labored breathing GI: Soft.  No distension. There is no tenderness.  Skin: Warm dry and intact visible skin   Neuro: Mental Status: Patient is awake, alert, oriented to person only Patient is able to identify her daughter at the bedside but otherwise gives very limited history Mild difficulty with complex repetition and naming and low-frequency objects Cranial Nerves: II: Visual Fields are full. Pupils are equal, round, and reactive to light.  III,IV, VI: EOMI with mild left eye ptosis.  Saccadic pursuits V: Facial sensation is symmetric to temperature VII: Facial movement is notable for subtle left facial droop.  VIII: hearing is intact to voice X: Uvula elevates symmetrically XI: Shoulder shrug is symmetric. XII: tongue is  midline without atrophy or fasciculations.  Motor: Tone is normal. Bulk is normal.  Confrontational strength testing is limited secondary to her dementia. Moves all extremities  Sensory: Grossly symmetric to light noxious stimulation (tickle).  Does not reliably report left and right Deep Tendon Reflexes: 3+ and symmetric in the biceps and patellae.  Cerebellar: FNF and HKS are intact bilaterally  Discharge Diet       Diet   DIET DYS 2 Room service appropriate? Yes; Fluid consistency: Thin   liquids  DISCHARGE PLAN Disposition:  home with PT/OT aspirin 81 mg daily for secondary stroke prevention Ongoing stroke risk factor control  by Primary Care Physician at time of discharge Follow-up PCP Wynn Banker, MD in 2 weeks. Follow-up in [facility] in 4 weeks, office to schedule an appointment.   45 minutes were spent preparing discharge.  Gevena Mart, NP

## 2021-04-03 NOTE — Progress Notes (Signed)
Occupational Therapy Evaluation Patient Details Name: Lori Bell MRN: [id number] DOB: Feb 08, 1946 Today's Date: 04/03/2021    History of Present Illness 75 year old woman presenting with an episode of shortness of breath followed by loss of consciousness and left-sided facial droop with slowness to respond. +[facility]13 at [facility] - stopped early due to oral bleeding. Past medical history significant for Altheimer's dementia, hypertension, hyperlipidemia, coronary artery disease.   Clinical Impression   PTA pt lives at home with her daughter who assists with ADL and mobility as needed. Pt most likely returning close to baseline and would benefit from continued HHOT in familiar home environment with focus on family education and strategies to increase independence, decrease caregiver burden and reduce risk of falls. Daughter states "I've been trying to get help".Encourage staff to ambulate pt frequently, including trips to the bathroom to reduce restlessness.  Will follow acutely.     Follow Up Recommendations  Home health OT;Supervision/Assistance - 24 hour    Equipment Recommendations  Other (comment);None recommended by OT    Recommendations for Other Services       Precautions / Restrictions Precautions Precautions: Fall Restrictions Weight Bearing Restrictions: No      Mobility Bed Mobility Overal bed mobility: Needs Assistance Bed Mobility: Supine to Sit     Supine to sit: Min guard (HHA)          Transfers Overall transfer level: Needs assistance   Transfers: Sit to/from Stand Sit to Stand: +2 safety/equipment;Min guard              Balance Overall balance assessment: Mild deficits observed, not formally tested (daughter states pt falls 3-4x/yr)                                         ADL either performed or assessed with clinical judgement   ADL Overall ADL's : Needs assistance/impaired                                      Functional mobility during ADLs: Minimal assistance (HHA) General ADL Comments: Pt most likely close to baseline regarding ADL tasks. Easy to redirect during tasks. Daughter states her Mom has difficulty getting up from her recliner chair and they bought her a lift chair, however her Mom is not able to use it and starts to climbing over the side of the chair. Recommend use of familair recliner with chair lift/riser instead to increase independence from familair chair.     Vision Baseline Vision/History: Wears glasses Patient Visual Report:  (most likley reduced peripheral vision)       Perception     Praxis      Pertinent Vitals/Pain Pain Assessment: No/denies pain     Hand Dominance Right   Extremity/Trunk Assessment Upper Extremity Assessment Upper Extremity Assessment: Generalized weakness   Lower Extremity Assessment Lower Extremity Assessment: Defer to PT evaluation   Cervical / Trunk Assessment Cervical / Trunk Assessment: Normal   Communication Communication Communication: No difficulties   Cognition Arousal/Alertness: Awake/alert Behavior During Therapy: WFL for tasks assessed/performed (pleasant) Overall Cognitive Status: Impaired/Different from baseline                                 General Comments: Pt most likely more  confused due to unfamiliar setting/environment. PT approrpiate during conversation; tangential but easy to redirect   General Comments       Exercises     Shoulder Instructions      Home Living Family/patient expects to be discharged to:: Private residence Living Arrangements: Children Available Help at Discharge: Available 24 hours/day Type of Home: House Home Access: Stairs to enter [employer] of Steps: 1   Home Layout: One level;Other (Comment) (1 step into living room)     Bathroom Shower/Tub: Producer, television/film/video: Standard Bathroom Accessibility: Yes How Accessible: Accessible via  walker Home Equipment: Walker - 2 wheels;Cane - single point;Bedside commode;Shower seat;Wheelchair - manual;Grab bars - tub/shower;Grab bars - toilet;Hand held shower head          Prior Functioning/Environment Level of Independence: Needs assistance  Gait / Transfers Assistance Needed: Walks in the home without AD; daughter encouraged use of AD/RW in community ADL's / Homemaking Assistance Needed: Able to feed herself; family assist wtih all ADL tasks            OT Problem List: Decreased strength;Decreased activity tolerance;Impaired balance (sitting and/or standing);Decreased safety awareness;Decreased knowledge of use of DME or AE      OT Treatment/Interventions: Self-care/ADL training;Therapeutic exercise;DME and/or AE instruction;Therapeutic activities;Cognitive remediation/compensation;Visual/perceptual remediation/compensation;Patient/family education;Balance training    OT Goals(Current goals can be found in the care plan section) Acute Rehab OT Goals Patient Stated Goal: per daughter to get some help at home OT Goal Formulation: With patient/family Time For Goal Achievement: 04/17/21 Potential to Achieve Goals: Good  OT Frequency: Min 2X/week   Barriers to D/C:            Co-evaluation PT/OT/SLP Co-Evaluation/Treatment: Yes Reason for Co-Treatment: For patient/therapist safety;Necessary to address cognition/behavior during functional activity   OT goals addressed during session: ADL's and self-care      AM-PAC OT "6 Clicks" Daily Activity     Outcome Measure Help from another person eating meals?: A Little Help from another person taking care of personal grooming?: A Little Help from another person toileting, which includes using toliet, bedpan, or urinal?: A Little Help from another person bathing (including washing, rinsing, drying)?: A Lot Help from another person to put on and taking off regular upper body clothing?: A Little Help from another person to  put on and taking off regular lower body clothing?: A Little 6 Click Score: 17   End of Session Nurse Communication: Mobility status;Other (comment) (DC recommendations)  Activity Tolerance: Patient tolerated treatment well Patient left: in chair;with call bell/phone within reach;with restraints reapplied;with family/visitor present  OT Visit Diagnosis: Unsteadiness on feet (R26.81);Muscle weakness (generalized) (M62.81);Other symptoms and signs involving cognitive function                Time: 2836-6294 OT Time Calculation (min): 25 min Charges:  OT General Charges $OT Visit: 1 Visit OT Evaluation $OT Eval Moderate Complexity: 1 Mod  Zeya Balles, OT/L   Acute OT Clinical Specialist Acute Rehabilitation Services Pager [phone] Office [phone]   [facility] 04/03/2021, 11:48 AM

## 2021-04-03 NOTE — Progress Notes (Signed)
EEG Completed; Results Pending  

## 2021-04-03 NOTE — TOC Transition Note (Signed)
Transition of Care [facility]) - CM/SW Discharge Note   Patient Details  Name: Lori Bell MRN: [id number] Date of Birth: 04-20-1946  Transition of Care [facility]) CM/SW Contact:  Glennon Mac, RN Phone Number: 04/03/2021, 1500  Clinical Narrative:  75 year old woman presenting with an episode of shortness of breath followed by loss of consciousness and left-sided facial droop with slowness to respond. +[facility]13 at [facility] - stopped early due to oral bleeding.   Prior to admission, patient requires assistance with ADLs; lives at home with daughter, who provides 24-hour assistance.  PT/OT recommending home health follow-up, and patient/family agreeable to services.  Referral to [facility], per choice/insurance contracts.  Daughter states that she has all needed DME at home.   Final next level of care: Home w Home Health Services Barriers to Discharge: Barriers Resolved   Patient Goals and CMS Choice Patient states their goals for this hospitalization and ongoing recovery are:: to go home CMS Medicare.gov Compare Post Acute Care list provided to:: Patient Choice offered to / list presented to : Adult Children (daughter)                        Discharge Plan and Services   Discharge Planning Services: CM Consult Post Acute Care Choice: Home Health                    HH Arranged: PT, OT [facility] Agency: [facility] Date [facility] Agency Contacted: 04/03/21 Time HH Agency Contacted: 1227 Representative spoke with at [facility] Agency: Amy Hyatt  Social Determinants of Health (SDOH) Interventions     Readmission Risk Interventions No flowsheet data found.  Quintella Baton, RN, BSN  Trauma/Neuro ICU Case Manager [phone]

## 2021-04-03 NOTE — Progress Notes (Signed)
   Daily Progress Note   Patient Name: Lori Bell       Date: 04/03/2021 DOB: [birth year]  Age: 75 y.o. MRN#: [id number] Attending Physician: Harolyn Rutherford, MD Primary Care Physician: Wynn Banker, MD Admit Date: 04/01/2021  Reason for Consultation/Follow-up: Establishing goals of care  Subjective: Chart Reviewed. Updates Received. Patient Assessed.   Patient sitting up in recliner drinking ensure. Recently finished working with PT. Denies pain or shortness of breath. Daughter, [name] is at the bedside. Updates provided.   [name] shares patient may be discharging today. Patient is improving and daughter confirms family is in the home and prepared to assist patient around the clock with home health assistance.   Daughter is clear in expressed goals to continue to treat the treatable as family is hopeful for continued recovery. They are appreciative of her progress thus far. Detailed education of expectations once she returns home.   Education provided at length regarding outpatient Palliative's goals and philosophy of care. Daughter verbalized understanding and wishes for outpatient support.   All questions answered and support provided.  Length of Stay: 1 day  Vital Signs: BP (!) 119/51   Pulse 72   Temp 97.7 F (36.5 C) (Oral)   Resp 16   Ht 5\' 5"  (1.651 m)   Wt 71.7 kg   SpO2 95%   BMI 26.30 kg/m  SpO2: SpO2: 95 % O2 Device: O2 Device: Room Air O2 Flow Rate: O2 Flow Rate (L/min): 2 L/min  Physical Exam: NAD, elderly woman RRR Clear bilaterally Follows commands, mood appropriate               Palliative Care Assessment & Plan   Code Status: Full code  Goals of Care/Recommendations: Continue to treat the treatable, family expressed goals for patient to return home with their support in addition to home health. They are remaining hopeful for continued improvement allowing patient the ability to thrive.   Prognosis: Guarded  Discharge Planning: Home with  Home Health and outpatient Palliative   Thank you for allowing the Palliative Medicine Team to assist in the care of this patient.  Time Total: 45 min.   Visit consisted of counseling and education dealing with the complex and emotionally intense issues of symptom management and palliative care in the setting of serious and potentially life-threatening illness.Greater than 50%  of this time was spent counseling and coordinating care related to the above assessment and plan.  , AGPCNP-BC  Palliative Medicine Team [phone]

## 2021-04-03 NOTE — Progress Notes (Signed)
[facility] [phone] Civil engineer, contracting [facility] Liaison note:   This patient is currently enrolled in [facility] outpatient-based Palliative Care. Will continue to follow for disposition.  Please call with any outpatient palliative questions or concerns.  Thank you, Abran Cantor, LPN [facility] Liaison [phone]

## 2021-04-03 NOTE — Evaluation (Signed)
Physical Therapy Evaluation Patient Details Name: Lori Bell MRN: [id number] DOB: 1946/06/02 Today's Date: 04/03/2021   History of Present Illness  75 year old woman presenting with an episode of shortness of breath followed by loss of consciousness and left-sided facial droop with slowness to respond. +[facility]13 at [facility] - stopped early due to oral bleeding. Past medical history significant for Altheimer's dementia, hypertension, hyperlipidemia, coronary artery disease.  Clinical Impression  PTA, pt lives with her daughter, requires assist for ADL's, and ambulates independently. Pt presents likely fairly close to her functional baseline. Pt pleasantly confused and follows one step commands. Pt ambulating 100 feet with min guard assist; handheld guidance provided for environmental negotiation. Pt daughter would like extra resource of HHPT/OT to maintain pt currently mobility level which is appropriate. Discussed with CM.     Follow Up Recommendations Home health PT;Supervision/Assistance - 24 hour    Equipment Recommendations  None recommended by PT    Recommendations for Other Services       Precautions / Restrictions Precautions Precautions: Fall;Other (comment) Precaution Comments: posey Restrictions Weight Bearing Restrictions: No      Mobility  Bed Mobility Overal bed mobility: Needs Assistance Bed Mobility: Supine to Sit     Supine to sit: Min guard (HHA)          Transfers Overall transfer level: Needs assistance Equipment used: None Transfers: Sit to/from Stand Sit to Stand: +2 safety/equipment;Min guard            Ambulation/Gait Ambulation/Gait assistance: Min guard;+2 safety/equipment Gait Distance (Feet): 100 Feet Assistive device: 1 person hand held assist Gait Pattern/deviations: Step-through pattern;Decreased stride length     General Gait Details: handheld guidance for environmental negotiation  Stairs            Wheelchair Mobility     Modified Rankin (Stroke Patients Only) Modified Rankin (Stroke Patients Only) Pre-Morbid Rankin Score: Moderately severe disability Modified Rankin: Moderately severe disability     Balance Overall balance assessment: Mild deficits observed, not formally tested (daughter states pt falls 3-4x/yr)                                           Pertinent Vitals/Pain Pain Assessment: No/denies pain    Home Living Family/patient expects to be discharged to:: Private residence Living Arrangements: Children Available Help at Discharge: Available 24 hours/day Type of Home: House Home Access: Stairs to enter   [employer] of Steps: 1 Home Layout: One level;Other (Comment) (1 step into living room) Home Equipment: Walker - 2 wheels;Cane - single point;Bedside commode;Shower seat;Wheelchair - manual;Grab bars - tub/shower;Grab bars - toilet;Hand held shower head      Prior Function Level of Independence: Needs assistance   Gait / Transfers Assistance Needed: Walks in the home without AD; daughter encouraged use of AD/RW in community  ADL's / Homemaking Assistance Needed: Able to feed herself; family assist wtih all ADL tasks        Hand Dominance   Dominant Hand: Right    Extremity/Trunk Assessment   Upper Extremity Assessment Upper Extremity Assessment: Generalized weakness    Lower Extremity Assessment Lower Extremity Assessment: Generalized weakness    Cervical / Trunk Assessment Cervical / Trunk Assessment: Kyphotic  Communication   Communication: No difficulties  Cognition Arousal/Alertness: Awake/alert Behavior During Therapy: WFL for tasks assessed/performed (pleasant) Overall Cognitive Status: Impaired/Different from baseline  General Comments: Pt most likely more confused due to unfamiliar setting/environment. Pt appropriate during conversation; tangential but easy to redirect       General Comments      Exercises     Assessment/Plan    PT Assessment Patient needs continued PT services  PT Problem List Decreased strength;Decreased activity tolerance;Decreased balance;Decreased mobility;Decreased cognition;Decreased safety awareness       PT Treatment Interventions Gait training;Therapeutic activities;Functional mobility training;Therapeutic exercise;Balance training;Patient/family education    PT Goals (Current goals can be found in the Care Plan section)  Acute Rehab PT Goals Patient Stated Goal: per daughter to get some help at home PT Goal Formulation: With patient Time For Goal Achievement: 04/17/21 Potential to Achieve Goals: Good    Frequency Min 3X/week   Barriers to discharge        Co-evaluation PT/OT/SLP Co-Evaluation/Treatment: Yes Reason for Co-Treatment: For patient/therapist safety;To address functional/ADL transfers;Necessary to address cognition/behavior during functional activity PT goals addressed during session: Mobility/safety with mobility OT goals addressed during session: ADL's and self-care       AM-PAC PT "6 Clicks" Mobility  Outcome Measure Help needed turning from your back to your side while in a flat bed without using bedrails?: A Little Help needed moving from lying on your back to sitting on the side of a flat bed without using bedrails?: A Little Help needed moving to and from a bed to a chair (including a wheelchair)?: A Little Help needed standing up from a chair using your arms (e.g., wheelchair or bedside chair)?: A Little Help needed to walk in hospital room?: A Little Help needed climbing 3-5 steps with a railing? : A Little 6 Click Score: 18    End of Session   Activity Tolerance: Patient tolerated treatment well Patient left: in chair;with restraints reapplied;with family/visitor present Nurse Communication: Mobility status PT Visit Diagnosis: Unsteadiness on feet (R26.81);History of falling  (Z91.81);Muscle weakness (generalized) (M62.81)    Time: 5053-9767 PT Time Calculation (min) (ACUTE ONLY): 24 min   Charges:   PT Evaluation $PT Eval Moderate Complexity: 1 Mod          Lillia Pauls, PT, DPT Acute Rehabilitation Services Pager [phone] Office [phone]   Norval Morton 04/03/2021, 1:09 PM

## 2021-04-03 NOTE — Plan of Care (Signed)
Patient agitated overnight. Qtc 0.49 on monitor per nursing. Patient already on double home benzodiazepene dose (0.5 mg BID instead of 0.25 mg BID). Will give 2.5 mg olanzapine oral x 1 dose and continue to monitor Qtc and symptomatic improvement. Per chart review she did not tolerate risperidone well in the past.   Brooke Dare MD-PhD [facility] [phone]  Available 7 PM to 7 AM, outside of these hours please call Neurologist on call as listed on Amion.

## 2021-04-04 ENCOUNTER — Telehealth: Payer: Self-pay

## 2021-04-04 DIAGNOSIS — Z7983 Long term (current) use of bisphosphonates: Secondary | ICD-10-CM | POA: Diagnosis not present

## 2021-04-04 DIAGNOSIS — E039 Hypothyroidism, unspecified: Secondary | ICD-10-CM | POA: Diagnosis not present

## 2021-04-04 DIAGNOSIS — I2510 Atherosclerotic heart disease of native coronary artery without angina pectoris: Secondary | ICD-10-CM | POA: Diagnosis not present

## 2021-04-04 DIAGNOSIS — M810 Age-related osteoporosis without current pathological fracture: Secondary | ICD-10-CM | POA: Diagnosis not present

## 2021-04-04 DIAGNOSIS — F0280 Dementia in other diseases classified elsewhere without behavioral disturbance: Secondary | ICD-10-CM | POA: Diagnosis not present

## 2021-04-04 DIAGNOSIS — G309 Alzheimer's disease, unspecified: Secondary | ICD-10-CM | POA: Diagnosis not present

## 2021-04-04 DIAGNOSIS — G458 Other transient cerebral ischemic attacks and related syndromes: Secondary | ICD-10-CM | POA: Diagnosis not present

## 2021-04-04 DIAGNOSIS — R29818 Other symptoms and signs involving the nervous system: Secondary | ICD-10-CM | POA: Diagnosis not present

## 2021-04-04 DIAGNOSIS — I10 Essential (primary) hypertension: Secondary | ICD-10-CM | POA: Diagnosis not present

## 2021-04-04 NOTE — Telephone Encounter (Signed)
Stacy from [facility] called requesting physical therapy services 2x 3wks and 1x 6wks

## 2021-04-05 ENCOUNTER — Telehealth: Payer: Self-pay

## 2021-04-05 NOTE — Telephone Encounter (Signed)
Transition Care Management Follow-up Telephone Call Date of discharge and from where: 04/03/2021  [facility]  How have you been since you were released from the hospital? Sleepy  Any questions or concerns? No  Items Reviewed: Did the pt receive and understand the discharge instructions provided? Yes  Medications obtained and verified? Yes  Other? No  Any new allergies since your discharge? No  Dietary orders reviewed? No Do you have support at home? Yes   Home Care and Equipment/Supplies: Were home health services ordered? yes If so, what is the name of the agency? PT/OT [facility]  Has the agency set up a time to come to the patient's home? yes Were any new equipment or medical supplies ordered?  No What is the name of the medical supply agency? N/a Were you able to get the supplies/equipment? not applicable Do you have any questions related to the use of the equipment or supplies? No  Functional Questionnaire: (I = Independent and D = Dependent) ADLs: D  Bathing/Dressing- D  Meal Prep- D  Eating- D  Maintaining continence- D  Transferring/Ambulation- D  Managing Meds- D  Follow up appointments reviewed:  PCP Hospital f/u appt confirmed? Yes  Scheduled to see Dr.Koberlein  on 04/17/2021@ 1030 Specialist Hospital f/u appt confirmed? Yes  Scheduled to see Dr.Ahern  on 04/19/2021@ 730am  Are transportation arrangements needed? No  If their condition worsens, is the pt aware to call PCP or go to the Emergency Dept.? Yes Was the patient provided with contact information for the PCP's office or ED? Yes Was to pt encouraged to call back with questions or concerns? Yes

## 2021-04-05 NOTE — Telephone Encounter (Signed)
Ok

## 2021-04-05 NOTE — Telephone Encounter (Signed)
I called [facility] at [phone] and the receptionist transferred me to [facility].  Kennyth Arnold was given the approval as below for PT.

## 2021-04-10 ENCOUNTER — Telehealth: Payer: Self-pay | Admitting: Family Medicine

## 2021-04-10 NOTE — Telephone Encounter (Signed)
Georgiann Mohs call from [facility] and stated pt need occupational therapy 2 x a week for 4 weeks. Jeanice Lim # is [phone].

## 2021-04-10 NOTE — Telephone Encounter (Signed)
Left a detailed message on Holly's voicemail with the approval for orders as below. 

## 2021-04-10 NOTE — Telephone Encounter (Signed)
ok 

## 2021-04-17 ENCOUNTER — Telehealth: Payer: Self-pay | Admitting: *Deleted

## 2021-04-17 ENCOUNTER — Ambulatory Visit (INDEPENDENT_AMBULATORY_CARE_PROVIDER_SITE_OTHER): Payer: PPO | Admitting: Family Medicine

## 2021-04-17 ENCOUNTER — Encounter: Payer: Self-pay | Admitting: Family Medicine

## 2021-04-17 ENCOUNTER — Other Ambulatory Visit: Payer: Self-pay

## 2021-04-17 VITALS — BP 132/60 | HR 58 | Temp 98.2°F | Ht 65.0 in | Wt 154.5 lb

## 2021-04-17 DIAGNOSIS — I10 Essential (primary) hypertension: Secondary | ICD-10-CM | POA: Diagnosis not present

## 2021-04-17 DIAGNOSIS — M810 Age-related osteoporosis without current pathological fracture: Secondary | ICD-10-CM

## 2021-04-17 DIAGNOSIS — G309 Alzheimer's disease, unspecified: Secondary | ICD-10-CM | POA: Diagnosis not present

## 2021-04-17 DIAGNOSIS — F0280 Dementia in other diseases classified elsewhere without behavioral disturbance: Secondary | ICD-10-CM

## 2021-04-17 DIAGNOSIS — E782 Mixed hyperlipidemia: Secondary | ICD-10-CM

## 2021-04-17 DIAGNOSIS — E039 Hypothyroidism, unspecified: Secondary | ICD-10-CM | POA: Diagnosis not present

## 2021-04-17 MED ORDER — MELOXICAM 7.5 MG PO TABS: 7.5000 mg | ORAL_TABLET | Freq: Every day | ORAL | 2 refills | Status: AC

## 2021-04-17 NOTE — Telephone Encounter (Signed)
Per PCP, the letter for an appeal and home health certification and plan of care from [facility] (dated 8/16-10/14/2022) was faxed to [facility] [employer] at [phone].

## 2021-04-17 NOTE — Progress Notes (Signed)
Lori Bell DOB: October 14, 1945 Encounter date: 04/17/2021  This is a 75 y.o. female who presents with Chief Complaint  Patient presents with   Hospitalization Follow-up    History of present illness: Here with daughter tammy today.  PT has been at home and daughter states that this is really helping. PT is doing exercises with her and then daughter working with her as well. Occupational therapy has only been out once to do evaluation. Supposed to come out again today. Daughter has felt like Graceanne has needed PT for a long time. We tried to get this for her in the past, but it was not covered by insurance. She does seem to be more receptive to outside help. Daughter videos what Kylah does with therapist and then shows those back to her so that she can show her how she did exercises. Feels that she needs leg strength. There is a small step up to get in and out of living room. There is shower bar there on the wall, but working on leg strength to prevent falling into living room is important. She fell twice last year and knocked self out on the couch.   Depth perception off with alzheimers so she has put yellow tape on edge of stair line.    Having more trouble swallowing food. More trouble with using straw. Other daughters were with her over weekend and noted more issues with this. Holding food in her mouth; like maybe not remembering to swallow. This has advanced since being in hospital. Bedside eval done in hospital. Occupational therapy told [name] that they wanted to come and watch Haizel eat a whole meal to evaluate.   [name] got her applesauce packets at home and she likes those.   Left knee is bothering her. [name] thinks that she fell about 8 days ago. Complains of knee pain daily - tylenol arthritis does seem to help some. She is complaining as soon as she is up every day.   Unresponsive episodes that she has been having have been diagnosed as seizures. Has been on Keppra now for this.  Made her sleepy at first but now adjusting.   Has appointment with neurology at 7am.   Sleeping ok - using melatonin gummies that seem to work. Takes 6mg  at bedtime.   Does take cbd gummies 25mg  morning, evening. Helped with agitation.      Allergies  Allergen Reactions   Percocet [Oxycodone-Acetaminophen] Hives and Swelling   Risperidone Other (See Comments)    Made patient completely incapacitated    Seroquel [Quetiapine Fumarate] Other (See Comments)    Neck pain   Simvastatin Other (See Comments)    Severe hip pain  Tolerates pravastatin   Current Meds  Medication Sig   acetaminophen (TYLENOL) 650 MG CR tablet Take 650 mg by mouth daily as needed for pain.   alendronate (FOSAMAX) 70 MG tablet TAKE 1 TABLET BY MOUTH ONCE WEEKLY. (Patient taking differently: Take 70 mg by mouth every Thursday.)   ALPRAZolam (XANAX) 0.5 MG tablet Take 1 tablet (0.5 mg total) by mouth 2 (two) times daily as needed for anxiety.   aspirin EC 81 MG EC tablet Take 1 tablet (81 mg total) by mouth daily. Swallow whole.   docusate sodium (COLACE) 100 MG capsule Take 100-200 mg by mouth See admin instructions. Take 2 capsules (200mg ) by mouth in the morning and 1 capsule (100mg ) by mouth at bedtime.   DULoxetine (CYMBALTA) 60 MG capsule TAKE ONE CAPSULE BY MOUTH ONCE DAILY. (  Patient taking differently: Take 60 mg by mouth at bedtime.)   famotidine (PEPCID) 20 MG tablet TAKE (1) TABLET BY MOUTH TWICE DAILY. (Patient taking differently: Take 20 mg by mouth 2 (two) times daily.)   irbesartan (AVAPRO) 150 MG tablet Take 1 tablet (150 mg total) by mouth daily.   levETIRAcetam (KEPPRA XR) 500 MG 24 hr tablet Take 1 tablet (500 mg total) by mouth daily.   levothyroxine (SYNTHROID) 25 MCG tablet TAKE ONE TABLET BY MOUTH ONCE DAILY BEFORE BREAKFAST. (Patient taking differently: Take 25 mcg by mouth daily before breakfast.)   MELATONIN PO Take 6 mg by mouth at bedtime.   nitroGLYCERIN (NITROSTAT) 0.4 MG SL tablet  Place 1 tablet (0.4 mg total) under the tongue every 5 (five) minutes as needed for chest pain.   pravastatin (PRAVACHOL) 40 MG tablet TAKE ONE TABLET BY MOUTH ONCE DAILY. (Patient taking differently: Take 40 mg by mouth at bedtime.)   propranolol ER (INDERAL LA) 80 MG 24 hr capsule TAKE (1) CAPSULE BY MOUTH AT BEDTIME. (Patient taking differently: Take 80 mg by mouth at bedtime.)    Review of Systems  Constitutional:  Negative for chills, fatigue and fever.  Respiratory:  Negative for cough, chest tightness, shortness of breath and wheezing.   Cardiovascular:  Positive for chest pain (still complains of intermittent chest pain; per daughter more reflux related. has not been cooperative for further evaluation of this when attempted in the past). Negative for palpitations and leg swelling.  Musculoskeletal:  Positive for arthralgias (see hpi; left knee) and back pain (lower).  Psychiatric/Behavioral:  Negative for sleep disturbance. Agitation: much better with current medications.Nervous/anxious: doing much better.    Objective:  BP 132/60 (BP Location: Left Arm, Patient Position: Sitting, Cuff Size: Normal)   Pulse (!) 58   Temp 98.2 F (36.8 C) (Oral)   Ht 5\' 5"  (1.651 m)   Wt 154 lb 8 oz (70.1 kg)   SpO2 96%   BMI 25.71 kg/m   Weight: 154 lb 8 oz (70.1 kg)   BP Readings from Last 3 Encounters:  04/17/21 132/60  04/03/21 139/70  03/01/21 (!) 158/80   Wt Readings from Last 3 Encounters:  04/17/21 154 lb 8 oz (70.1 kg)  04/02/21 158 lb 1.1 oz (71.7 kg)  01/05/21 150 lb (68 kg)    Physical Exam Constitutional:      General: She is not in acute distress.    Appearance: She is well-developed.  Cardiovascular:     Rate and Rhythm: Normal rate and regular rhythm.     Heart sounds: Normal heart sounds. No murmur heard.   No friction rub.  Pulmonary:     Effort: Pulmonary effort is normal. No respiratory distress.     Breath sounds: Normal breath sounds. No wheezing or rales.   Musculoskeletal:     Right lower leg: No edema.     Left lower leg: No edema.  Neurological:     Mental Status: She is alert and oriented to person, place, and time.     Motor: Weakness (4+/5 bicep, 4/5 tricep, 4+/5 shoulder abduction, 4+/5 quad, 4/5 hamstring) present.  Psychiatric:        Behavior: Behavior normal.    Assessment/Plan  1. Essential (primary) hypertension Has been well controlled; continue current medications but discussed with daughter monitoring pressures at home as Keyonta is much more subdued and quiet, less agitated. We want to avoid low pressures. Can adjust and decrease medications is pressures begin running on lower  end.   2. Hypothyroidism, unspecified type Has been stable; continue synthroid daily  3. Mixed hyperlipidemia We discussed limiting medications today at visit. Daughter is going to finish statin she has at home now and then we will stop. Patient is having increased difficulty with swallowing (speech therapy evaluating today at home) and we want to minimize where possible.   4. 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 Has followup this week with neurology. She is much less agitated and is doing well at home in terms of appetite and redirection. Sleeping well but not over-sedated.   5. Osteoporosis, unspecified osteoporosis type, unspecified pathological fracture presence We discussed stopping fosamax. As dementia progresses, do not feel that this remains essential medication.   6. Weakness: patient does have some weakness. Working with PT at home. I have submitted appeal to get full recommended 12 PT sessions at home (insurance only approved 9). I do think she will benefit from outside help with working on strengthening. Daughter will let me know how speech eval goes and if more elaborated eval is needed.   Return in about 3 months (around 07/18/2021) for Chronic condition visit.  45 minutes spent in chart review, time with  patient, charting, exam, discussion follow up plan with daughter, [name].   Theodis Shove, MD

## 2021-04-17 NOTE — Patient Instructions (Signed)
*  discuss consideration for decreasing propranolol to 60mg  rather than 80 to help with slight increase of heart rate/blood pressure. If neurology does not agree, let me know and we will back off irbesartan.

## 2021-04-19 ENCOUNTER — Encounter: Payer: Self-pay | Admitting: Neurology

## 2021-04-19 ENCOUNTER — Ambulatory Visit: Payer: PPO | Admitting: Neurology

## 2021-04-19 VITALS — BP 162/73 | HR 60 | Ht 65.0 in | Wt 155.8 lb

## 2021-04-19 DIAGNOSIS — R569 Unspecified convulsions: Secondary | ICD-10-CM

## 2021-04-19 DIAGNOSIS — G309 Alzheimer's disease, unspecified: Secondary | ICD-10-CM

## 2021-04-19 DIAGNOSIS — F0280 Dementia in other diseases classified elsewhere without behavioral disturbance: Secondary | ICD-10-CM | POA: Diagnosis not present

## 2021-04-19 DIAGNOSIS — G250 Essential tremor: Secondary | ICD-10-CM

## 2021-04-19 MED ORDER — PROPRANOLOL HCL ER 60 MG PO CP24: 60.0000 mg | ORAL_CAPSULE | Freq: Every day | ORAL | 4 refills | Status: AC

## 2021-04-19 MED ORDER — LEVETIRACETAM 250 MG PO TABS: 250.0000 mg | ORAL_TABLET | Freq: Two times a day (BID) | ORAL | 3 refills | Status: AC

## 2021-04-19 NOTE — Patient Instructions (Signed)
Keppra 250mg  twice daily Decrease propranolol to 60 can decrease further  Stroke Prevention Some medical conditions and lifestyle choices can lead to a higher risk for a stroke. You can help to prevent a stroke by eating healthy foods and exercising. It also helps to not smoke and to manage any health problems you may have. How can this condition affect me? A stroke is an emergency. It should be treated right away. A stroke can lead to brain damage or threaten your life. There is a better chance of surviving and getting better after a stroke if you get medical help right away. What can increase my risk? The following medical conditions may increase your risk of a stroke: Diseases of the heart and blood vessels (cardiovascular disease). High blood pressure (hypertension). Diabetes. High cholesterol. Sickle cell disease. Problems with blood clotting. Being very overweight. Sleeping problems (obstructivesleep apnea). Other risk factors include: Being older than age 58. A history of blood clots, stroke, or mini-stroke (TIA). Race, ethnic background, or a family history of stroke. Smoking or using tobacco products. Taking birth control pills, especially if you smoke. Heavy alcohol and drug use. Not being active. What actions can I take to prevent this? Manage your health conditions High cholesterol. Eat a healthy diet. If this is not enough to manage your cholesterol, you may need to take medicines. Take medicines as told by your doctor. High blood pressure. Try to keep your blood pressure below 130/80. If your blood pressure cannot be managed through a healthy diet and regular exercise, you may need to take medicines. Take medicines as told by your doctor. Ask your doctor if you should check your blood pressure at home. Have your blood pressure checked every year. Diabetes. Eat a healthy diet and get regular exercise. If your blood sugar (glucose) cannot be managed through diet and  exercise, you may need to take medicines. Take medicines as told by your doctor. Talk to your doctor about getting checked for sleeping problems. Signs of a problem can include: Snoring a lot. Feeling very tired. Make sure that you manage any other conditions you have. Nutrition  Follow instructions from your doctor about what to eat or drink. You may be told to: Eat and drink fewer calories each day. Limit how much salt (sodium) you use to 1,500 milligrams (mg) each day. Use only healthy fats for cooking, such as olive oil, canola oil, and sunflower oil. Eat healthy foods. To do this: Choose foods that are high in fiber. These include whole grains, and fresh fruits and vegetables. Eat at least 5 servings of fruits and vegetables a day. Try to fill one-half of your plate with fruits and vegetables at each meal. Choose low-fat (lean) proteins. These include low-fat cuts of meat, chicken without skin, fish, tofu, beans, and nuts. Eat low-fat dairy products. Avoid foods that: Are high in salt. Have saturated fat. Have trans fat. Have cholesterol. Are processed or pre-made. Count how many carbohydrates you eat and drink each day. Lifestyle If you drink alcohol: Limit how much you have to: 0-1 drink a day for women who are not pregnant. 0-2 drinks a day for men. Know how much alcohol is in your drink. In the [place] one drink equals one 12 oz bottle of beer (67), one 5 oz glass of wine ( ), or one 1 oz glass of hard liquor (66mL). Do not smoke or use any products that have nicotine or tobacco. If you need help quitting, ask your doctor. Avoid secondhand  smoke. Do not use drugs. Activity  Try to stay at a healthy weight. Get at least 30 minutes of exercise on most days, such as: Fast walking. Biking. Swimming. Medicines Take over-the-counter and prescription medicines only as told by your doctor. Avoid taking birth control pills. Talk to your doctor about the risks of taking  birth control pills if: You are over 51 years old. You smoke. You get very bad headaches. You have had a blood clot. Where to find more information American Stroke Association: www.strokeassociation.org Get help right away if: You or a loved one has any signs of a stroke. "BE FAST" is an easy way to remember the warning signs: B - Balance. Dizziness, sudden trouble walking, or loss of balance. E - Eyes. Trouble seeing or a change in how you see. F - Face. Sudden weakness or loss of feeling of the face. The face or eyelid may droop on one side. A - Arms. Weakness or loss of feeling in an arm. This happens all of a sudden and most often on one side of the body. S - Speech. Sudden trouble speaking, slurred speech, or trouble understanding what people say. T - Time. Time to call emergency services. Write down what time symptoms started. You or a loved one has other signs of a stroke, such as: A sudden, very bad headache with no known cause. Feeling like you may vomit (nausea). Vomiting. A seizure. These symptoms may be an emergency. Get help right away. Call your local emergency services (911 in the [place] Do not wait to see if the symptoms will go away. Do not drive yourself to the hospital. Summary You can help to prevent a stroke by eating healthy, exercising, and not smoking. It also helps to manage any health problems you have. Do not smoke or use any products that contain nicotine or tobacco. Get help right away if you or a loved one has any signs of a stroke. This information is not intended to replace advice given to you by your health care provider. Make sure you discuss any questions you have with your health care provider. Document Revised: 03/07/2020 Document Reviewed: 03/07/2020 Elsevier Patient Education  2022 [employer].

## 2021-04-19 NOTE — Progress Notes (Signed)
[facility]    Provider:  Dr Lucia Gaskins Requesting Provider: Mathews Argyle, NP Primary Care Provider:  Wynn Banker, MD  CC:  stroke and seizures  HPI:  Lori Bell is a 75 y.o. female here as requested by Mathews Argyle, NP for stroke-like symptoms. PMHx ischemic colitis, anxiety, cardiovascular disease, depression, hypertension, hyperlipidemia, hypertension, hypothyroidism, dementia, essential tremor.  I reviewed patient's notes, patient had strokelike symptoms status IV tPA in the setting of UTI, she has Alzheimer's disease, patient was admitted on April 02, 2021, CTV of the head showed no acute intracranial infarcts but did show significant atrophy and chronic small vessel disease, CT of the head was negative for large vessel occlusion but mild atheromatous changes throughout, 24-hour CT of the head was negative again for anything acute.  2D echo 60 to 65%, no thrombus, LDL 110, hemoglobin A1c 5.7, EEG revealed no seizures however they opted to start Keppra 500 mg XR daily.  UA was positive.  She was discharged on 81 mg for stroke prevention.  Patient is here with daughter who provides much information, daughter states left eye and left mouth was drooping and she could not move the left side at all, she went to the ER had tPA, she was told that patient had 3 small strokes, symptoms improved, no further symptoms or concern, she was having episodes of unresponsiveness 15 to 30 minutes.  Also states she has seizures.PT and OT currently. Her physical therapy and OT are going well. Her left-sided symptoms. They had come off her aspirin. They started her aspirin again. She is feeling better. They do not want to be on a statin. They are going to reduce the propranolol but the tremors can be severe.  They are working with OT/OT for her tremor as well. Dementia has progressed. Cannot swallow the Keppra ER.    Reviewed notes, labs and imaging from outside physicians, which  showed:  CT head  brain 04/02/2021: 1. Chronic small vessel disease without acute intracranial abnormality. 2. Left maxillary and sphenoid sinus disease and left mastoid effusion.   CTA head/neck 04/01/2021: IMPRESSION: 1. Negative CTA for emergent large vessel occlusion. 2. Mild for age atheromatous change about the carotid bifurcations without hemodynamically significant stenosis. 3. Moderate approximate 50% stenoses at the origins of both vertebral arteries. Otherwise wide patency of the posterior circulation. 4. Diffuse tortuosity of the major arterial vasculature of the head and neck, suggesting chronic underlying hypertension. 5. Diffuse interlobular septal thickening with ground-glass opacity within the visualized lungs, suggesting a degree of pulmonary interstitial edema.   Ua large leukocytes and nitrite   Review of Systems: Patient complains of symptoms per HPI as well as the following symptoms dementia. Pertinent negatives and positives per HPI. All others negative.   Social History   Socioeconomic History   Marital status: Widowed    Spouse name: Not on file   Number of children: 4   Years of education: 9   Highest education level: Not on file  Occupational History   Occupation: Retired     Comment: Delivering medications to pharmacies.   Tobacco Use   Smoking status: Never   Smokeless tobacco: Never  Vaping Use   Vaping Use: Never used  Substance and Sexual Activity   Alcohol use: No    Alcohol/week: 0.0 standard drinks   Drug use: No   Sexual activity: Never  Other Topics Concern   Not on file  Social History Narrative   Lives at home  with daughter   Caffeine use: none    Exercises with home therapy, otherwise little walking   Social Determinants of Health   Financial Resource Strain: Not on file  Food Insecurity: Not on file  Transportation Needs: Not on file  Physical Activity: Not on file  Stress: Not on file  Social Connections: Not on  file  Intimate Partner Violence: Not on file    Family History  Problem Relation Age of Onset   Dementia Mother    Parkinsonism Father    Dementia Sister    Cancer Brother    Colon cancer Brother 63       deceased    Stroke Brother    Dementia Brother    Dementia Brother    Heart disease Brother    Dementia Brother    Kidney failure Brother    Stroke Other    Coronary artery disease Other     Past Medical History:  Diagnosis Date   Acute (reversible) ischemia of large intestine, extent unspecified (HCC)    Acute ischemic colitis (HCC)    Anxiety disorder, unspecified    ASCVD (arteriosclerotic cardiovascular disease)    Depression    Essential (primary) hypertension    Gastrointestinal hemorrhage, unspecified    GERD (gastroesophageal reflux disease)    GI bleeding 06/10/2017   Hyperlipidemia    Hypertension    Hypothyroidism    Hypothyroidism, unspecified    Major depressive disorder, single episode, unspecified    Mixed hyperlipidemia    Unspecified dementia without behavioral disturbance (HCC)    Upper abdominal pain, unspecified     Patient Active Problem List   Diagnosis Date Noted   Stroke-like symptom 04/03/2021   Hypokalemia 04/03/2021   Osteoporosis 05/02/2020   Anxiety disorder, unspecified    Major depressive disorder, single episode, unspecified    Mixed hyperlipidemia    Essential (primary) hypertension    Hypothyroidism, unspecified    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 Ewing Schlein: internal and external hemorrhoids,  a few tin hyperplastic-appearing polyps not biopsed   COLONOSCOPY WITH PROPOFOL N/A 06/11/2017   Procedure: COLONOSCOPY WITH PROPOFOL;  Surgeon: West Bali, MD;  Location: [facility];  Service: Endoscopy;  Laterality: N/A;   INNER EAR SURGERY  1966   Right   TOTAL ABDOMINAL HYSTERECTOMY W/ BILATERAL SALPINGOOPHORECTOMY  05/2009   TUBAL LIGATION  1970    Current Outpatient Medications  Medication Sig Dispense Refill   acetaminophen (TYLENOL) 650 MG CR tablet Take 650 mg by mouth daily as needed for pain.     ALPRAZolam (XANAX) 0.5 MG tablet Take 1 tablet (0.5 mg total) by mouth 2 (two) times daily as needed for anxiety. 60 tablet 2   aspirin EC 81 MG EC tablet Take 1 tablet (81 mg total) by mouth daily. Swallow whole. 30 tablet 11   docusate sodium (COLACE) 100 MG capsule Take 100-200 mg by mouth See admin instructions. Take 2 capsules (200mg ) by mouth in the morning and 1 capsule (100mg ) by mouth at bedtime.  DULoxetine (CYMBALTA) 60 MG capsule TAKE ONE CAPSULE BY MOUTH ONCE DAILY. (Patient taking differently: Take 60 mg by mouth at bedtime.) 90 capsule 0   famotidine (PEPCID) 20 MG tablet TAKE (1) TABLET BY MOUTH TWICE DAILY. (Patient taking differently: Take 20 mg by mouth 2 (two) times daily.) 60 tablet 3   irbesartan (AVAPRO) 150 MG tablet Take 1 tablet (150 mg total) by mouth daily. 90 tablet 1   levETIRAcetam (KEPPRA) 250 MG tablet Take 1 tablet (250 mg total) by mouth 2 (two) times daily. 180 tablet 3   levothyroxine (SYNTHROID) 25 MCG tablet TAKE ONE TABLET BY MOUTH ONCE DAILY BEFORE BREAKFAST. (Patient taking differently: Take 25 mcg by mouth daily before breakfast.) 90 tablet 0   MELATONIN PO Take 6 mg by mouth at bedtime.     meloxicam (MOBIC) 7.5 MG tablet Take 1-2 tablets (7.5-15 mg total) by mouth daily. 60 tablet 2   nitroGLYCERIN (NITROSTAT) 0.4 MG SL tablet Place 1 tablet (0.4 mg total) under the tongue every 5 (five) minutes as needed for chest pain. 30 tablet 0    propranolol ER (INDERAL LA) 60 MG 24 hr capsule Take 1 capsule (60 mg total) by mouth at bedtime. 90 capsule 4   No current facility-administered medications for this visit.    Allergies as of 04/19/2021 - Review Complete 04/19/2021  Allergen Reaction Noted   Percocet [oxycodone-acetaminophen] Hives and Swelling 08/17/2020   Risperidone Other (See Comments) 04/02/2021   Seroquel [quetiapine fumarate] Other (See Comments) 08/17/2020   Simvastatin Other (See Comments) 08/07/2019    Vitals: BP (!) 162/73 (BP Location: Right Arm, Patient Position: Sitting)   Pulse 60   Ht 5\' 5"  (1.651 m)   Wt 155 lb 12.8 oz (70.7 kg)   BMI 25.93 kg/m  Last Weight:  Wt Readings from Last 1 Encounters:  04/19/21 155 lb 12.8 oz (70.7 kg)   Last Height:   Ht Readings from Last 1 Encounters:  04/19/21 5\' 5"  (1.651 m)     Physical exam: Exam: Gen: NAD,               CV: RRR, no MRG. No Carotid Bruits. No peripheral edema, warm, nontender Eyes: Conjunctivae clear without exudates or hemorrhage  Neuro: Detailed Neurologic Exam  Speech:    Speech is normal; fluent with impaired comprehension.  Cognition:    The patient is oriented to person,     recent and remote memory impaired;     language fluent;     impaired attention, concentration,     fund of knowledge Cranial Nerves:    The pupils are equal, round, and reactive to light. Pupils too small to visualize fundi. Visual fields are full to threat. Extraocular movements are intact. Trigeminal sensation is intact and the muscles of mastication are normal. The face is symmetric. The palate elevates in the midline. Hearing impaired to voice. Voice is normal. Shoulder shrug is normal. The tongue has normal motion without fasciculations.   Coordination:    Normla  Gait:    No ataxia  Motor Observation:   Slight postural and intention tremor Tone:    Normal muscle tone.    Posture:    Posture is slightly stooped    Strength:    Strength  is equal  in the upper and lower limbs. No focal deficit noted. Difficult exam due to advanced dementia     Sensation: intact to LT     Reflex Exam:  DTR's:    Deep tendon  reflexes in the upper and lower extremities are symmetrical bilaterally.   Toes:    The toes are equiv bilaterally.   Clonus:    Clonus is absent.    Assessment/Plan:  75 y.o. female here as requested by Mathews ArgyleWolfe, Denise A, NP for stroke-like symptoms. PMHx ischemic colitis, anxiety, cardiovascular disease, depression, hypertension, hyperlipidemia, hypertension, hypothyroidism, dementia, essential tremor.  I reviewed patient's notes, patient had strokelike symptoms status IV tPA in the setting of UTI, she has Alzheimer's disease, patient was admitted on April 02, 2021, CTV of the head showed no acute intracranial infarcts but did show significant atrophy and chronic small vessel disease, CT of the head was negative for large vessel occlusion but mild atheromatous changes throughout, 24-hour CT of the head was negative again for anything acute.  2D echo 60 to 65%, no thrombus, LDL 110, hemoglobin A1c 5.7, EEG revealed no seizures however they opted to start Keppra 500 mg XR daily.  UA was positive.  She was discharged on 81 mg for stroke prevention.  - she cannot swallow the keppra but want to stay on it, change to 250mg  bid instead of 500 ER - they want to stop statin, only critical medicines, she is in a late stage of dementia - decrease propranolol to 60 but [facility] continue [facility] - follow up as needed.   I had a long d/w about risk for stroke/TIAs, personally independently reviewed imaging studies and stroke evaluation results and answered questions.Continue asa for secondary stroke prevention and maintain strict control of hypertension with blood pressure goal below 130/90, diabetes with hemoglobin A1c goal below 6.5% and lipids with LDL cholesterol goal below 70 mg/dL.  No orders of the defined types were  placed in this encounter.  Meds ordered this encounter  Medications   propranolol ER (INDERAL LA) 60 MG 24 hr capsule    Sig: Take 1 capsule (60 mg total) by mouth at bedtime.    Dispense:  90 capsule    Refill:  4   levETIRAcetam (KEPPRA) 250 MG tablet    Sig: Take 1 tablet (250 mg total) by mouth 2 (two) times daily.    Dispense:  180 tablet    Refill:  3     Cc: Mathews ArgyleWolfe, Denise A, NP,  Wynn BankerKoberlein, Junell C, MD  Naomie DeanAntonia Adil Tugwell, MD  [facility] [address] [place], [place] [id number]  Phone [phone] Fax [phone]

## 2021-04-25 ENCOUNTER — Other Ambulatory Visit: Payer: Self-pay | Admitting: Family Medicine

## 2021-04-25 ENCOUNTER — Telehealth: Payer: Self-pay

## 2021-04-25 ENCOUNTER — Encounter: Payer: Self-pay | Admitting: Family Medicine

## 2021-04-25 DIAGNOSIS — G309 Alzheimer's disease, unspecified: Secondary | ICD-10-CM | POA: Diagnosis not present

## 2021-04-25 DIAGNOSIS — R29818 Other symptoms and signs involving the nervous system: Secondary | ICD-10-CM | POA: Diagnosis not present

## 2021-04-25 DIAGNOSIS — G458 Other transient cerebral ischemic attacks and related syndromes: Secondary | ICD-10-CM | POA: Diagnosis not present

## 2021-04-25 DIAGNOSIS — I10 Essential (primary) hypertension: Secondary | ICD-10-CM | POA: Diagnosis not present

## 2021-04-25 DIAGNOSIS — F0280 Dementia in other diseases classified elsewhere without behavioral disturbance: Secondary | ICD-10-CM | POA: Diagnosis not present

## 2021-04-25 DIAGNOSIS — M810 Age-related osteoporosis without current pathological fracture: Secondary | ICD-10-CM | POA: Diagnosis not present

## 2021-04-25 DIAGNOSIS — I2510 Atherosclerotic heart disease of native coronary artery without angina pectoris: Secondary | ICD-10-CM | POA: Diagnosis not present

## 2021-04-25 DIAGNOSIS — E039 Hypothyroidism, unspecified: Secondary | ICD-10-CM | POA: Diagnosis not present

## 2021-04-25 DIAGNOSIS — Z7983 Long term (current) use of bisphosphonates: Secondary | ICD-10-CM | POA: Diagnosis not present

## 2021-04-25 NOTE — Telephone Encounter (Signed)
Lori Bell from [facility] called requesting verbal orders for speech therapy determination for pocketing food. Call back # [phone]

## 2021-04-26 ENCOUNTER — Telehealth: Payer: Self-pay | Admitting: Family Medicine

## 2021-04-26 ENCOUNTER — Encounter: Payer: Self-pay | Admitting: Family Medicine

## 2021-04-26 DIAGNOSIS — E538 Deficiency of other specified B group vitamins: Secondary | ICD-10-CM

## 2021-04-26 DIAGNOSIS — E039 Hypothyroidism, unspecified: Secondary | ICD-10-CM

## 2021-04-26 DIAGNOSIS — R5383 Other fatigue: Secondary | ICD-10-CM

## 2021-04-26 DIAGNOSIS — R195 Other fecal abnormalities: Secondary | ICD-10-CM

## 2021-04-26 DIAGNOSIS — E611 Iron deficiency: Secondary | ICD-10-CM

## 2021-04-26 DIAGNOSIS — E559 Vitamin D deficiency, unspecified: Secondary | ICD-10-CM

## 2021-04-26 NOTE — Telephone Encounter (Signed)
Misty Stanley, a physical therapist with [employer], would like orders for speech therapy put in because Patient is having swallowing issues.   Good callback number is [phone] Can leave voicemail if no answer    Please Advise

## 2021-04-26 NOTE — Telephone Encounter (Signed)
Left a detailed message on Lori Bell's voicemail with the approval for orders as below.

## 2021-04-26 NOTE — Telephone Encounter (Signed)
Spoke with Misty Stanley and informed her Dr Hassan Rowan previously gave approval for speech therapy and the approval was left earlier on [facility] voicemail (see prior phone note).

## 2021-04-26 NOTE — Telephone Encounter (Signed)
ok 

## 2021-04-27 NOTE — Telephone Encounter (Signed)
Addressed in new message

## 2021-04-27 NOTE — Telephone Encounter (Signed)
Appt entered as below. 

## 2021-05-02 ENCOUNTER — Emergency Department (HOSPITAL_COMMUNITY): Payer: PPO

## 2021-05-02 ENCOUNTER — Other Ambulatory Visit: Payer: Self-pay

## 2021-05-02 ENCOUNTER — Encounter (HOSPITAL_COMMUNITY): Payer: Self-pay | Admitting: Emergency Medicine

## 2021-05-02 ENCOUNTER — Emergency Department (HOSPITAL_COMMUNITY)
Admission: EM | Admit: 2021-05-02 | Discharge: 2021-05-02 | Disposition: A | Discharge status: Home / Self Care | Payer: PPO | Attending: Emergency Medicine | Admitting: Emergency Medicine

## 2021-05-02 DIAGNOSIS — I10 Essential (primary) hypertension: Secondary | ICD-10-CM | POA: Insufficient documentation

## 2021-05-02 DIAGNOSIS — Y92009 Unspecified place in unspecified non-institutional (private) residence as the place of occurrence of the external cause: Secondary | ICD-10-CM | POA: Insufficient documentation

## 2021-05-02 DIAGNOSIS — G309 Alzheimer's disease, unspecified: Secondary | ICD-10-CM | POA: Insufficient documentation

## 2021-05-02 DIAGNOSIS — E039 Hypothyroidism, unspecified: Secondary | ICD-10-CM | POA: Insufficient documentation

## 2021-05-02 DIAGNOSIS — F0280 Dementia in other diseases classified elsewhere without behavioral disturbance: Secondary | ICD-10-CM | POA: Diagnosis not present

## 2021-05-02 DIAGNOSIS — S79912A Unspecified injury of left hip, initial encounter: Secondary | ICD-10-CM | POA: Diagnosis not present

## 2021-05-02 DIAGNOSIS — S32038A Other fracture of third lumbar vertebra, initial encounter for closed fracture: Secondary | ICD-10-CM | POA: Insufficient documentation

## 2021-05-02 DIAGNOSIS — Z7982 Long term (current) use of aspirin: Secondary | ICD-10-CM | POA: Insufficient documentation

## 2021-05-02 DIAGNOSIS — S32030A Wedge compression fracture of third lumbar vertebra, initial encounter for closed fracture: Secondary | ICD-10-CM | POA: Diagnosis not present

## 2021-05-02 DIAGNOSIS — M549 Dorsalgia, unspecified: Secondary | ICD-10-CM | POA: Diagnosis not present

## 2021-05-02 DIAGNOSIS — Z79899 Other long term (current) drug therapy: Secondary | ICD-10-CM | POA: Diagnosis not present

## 2021-05-02 DIAGNOSIS — W19XXXA Unspecified fall, initial encounter: Secondary | ICD-10-CM

## 2021-05-02 DIAGNOSIS — W010XXA Fall on same level from slipping, tripping and stumbling without subsequent striking against object, initial encounter: Secondary | ICD-10-CM | POA: Diagnosis not present

## 2021-05-02 DIAGNOSIS — R0902 Hypoxemia: Secondary | ICD-10-CM | POA: Diagnosis not present

## 2021-05-02 DIAGNOSIS — R404 Transient alteration of awareness: Secondary | ICD-10-CM | POA: Diagnosis not present

## 2021-05-02 DIAGNOSIS — M545 Low back pain, unspecified: Secondary | ICD-10-CM | POA: Diagnosis not present

## 2021-05-02 DIAGNOSIS — S34109A Unspecified injury to unspecified level of lumbar spinal cord, initial encounter: Secondary | ICD-10-CM | POA: Diagnosis present

## 2021-05-02 MED ORDER — LORAZEPAM 2 MG/ML IJ SOLN
1.0000 mg | Freq: Once | INTRAMUSCULAR | Status: AC
  Administered 2021-05-02: 1 mg via INTRAMUSCULAR
  Filled 2021-05-02: qty 1

## 2021-05-02 MED ORDER — HYDROCODONE-ACETAMINOPHEN 5-325 MG PO TABS: 1.0000 | ORAL_TABLET | Freq: Four times a day (QID) | ORAL | 0 refills | Status: DC | PRN

## 2021-05-02 MED ORDER — HYDROMORPHONE HCL 1 MG/ML IJ SOLN
0.5000 mg | Freq: Once | INTRAMUSCULAR | Status: AC
  Administered 2021-05-02: 0.5 mg via INTRAMUSCULAR
  Filled 2021-05-02: qty 1

## 2021-05-02 NOTE — ED Provider Notes (Signed)
[facility] EMERGENCY DEPARTMENT Provider Note   CSN: [id number] Arrival date & time: 05/02/21  8413     History Chief Complaint  Patient presents with   Randol Kern is a 75 y.o. female.  Patient's daughter states that the patient was on the floor kneeling and she did not realize she was there and she tripped over her and fell on top of her and heard a crack.  Patient complains of lower back pain  The history is provided by the patient and medical records. No language interpreter was used.  Fall This is a new problem. The current episode started yesterday. The problem occurs rarely. The problem has been resolved. Pertinent negatives include no chest pain, no abdominal pain and no headaches. Exacerbated by: Movement. Nothing relieves the symptoms. She has tried nothing for the symptoms. The treatment provided no relief.      Past Medical History:  Diagnosis Date   Acute (reversible) ischemia of large intestine, extent unspecified (HCC)    Acute ischemic colitis (HCC)    Anxiety disorder, unspecified    ASCVD (arteriosclerotic cardiovascular disease)    Depression    Essential (primary) hypertension    Gastrointestinal hemorrhage, unspecified    GERD (gastroesophageal reflux disease)    GI bleeding 06/10/2017   Hyperlipidemia    Hypertension    Hypothyroidism    Hypothyroidism, unspecified    Major depressive disorder, single episode, unspecified    Mixed hyperlipidemia    Unspecified dementia without behavioral disturbance (HCC)    Upper abdominal pain, unspecified     Patient Active Problem List   Diagnosis Date Noted   Stroke-like symptom 04/03/2021   Hypokalemia 04/03/2021   Osteoporosis 05/02/2020   Anxiety disorder, unspecified    Major depressive disorder, single episode, unspecified    Mixed hyperlipidemia    Essential (primary) hypertension    Hypothyroidism, unspecified    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 Ewing Schlein: internal and external hemorrhoids, a few tin hyperplastic-appearing polyps not biopsed   COLONOSCOPY WITH PROPOFOL N/A 06/11/2017   Procedure: COLONOSCOPY WITH PROPOFOL;  Surgeon: West Bali, MD;  Location: [facility];  Service: Endoscopy;  Laterality: N/A;   INNER EAR SURGERY  1966   Right   TOTAL ABDOMINAL HYSTERECTOMY W/ BILATERAL SALPINGOOPHORECTOMY  05/2009   TUBAL LIGATION  1970     OB History   No obstetric history on file.     Family History  Problem Relation Age of Onset   Dementia Mother    Parkinsonism Father    Dementia Sister    Cancer Brother    Colon cancer Brother 29       deceased    Stroke Brother    Dementia Brother    Dementia Brother    Heart disease Brother    Dementia Brother    Kidney failure Brother    Stroke Other    Coronary artery disease Other     Social History   Tobacco Use   Smoking status: Never  Smokeless tobacco: Never  Vaping Use   Vaping Use: Never used  Substance Use Topics   Alcohol use: No    Alcohol/week: 0.0 standard drinks   Drug use: No    Home Medications Prior to Admission medications   Medication Sig Start Date End Date Taking? Authorizing Provider  acetaminophen (TYLENOL) 650 MG CR tablet Take 650 mg by mouth daily as needed for pain.   Yes [provider]  ALPRAZolam (XANAX) 0.5 MG tablet Take 1 tablet (0.5 mg total) by mouth 2 (two) times daily as needed for anxiety. 12/30/20  Yes Wynn Banker, MD  aspirin EC 81 MG EC tablet Take 1 tablet (81 mg total) by mouth daily. Swallow  whole. 04/03/21  Yes Mathews Argyle, NP  docusate sodium (COLACE) 100 MG capsule Take 100-200 mg by mouth See admin instructions. Take 2 capsules (200mg ) by mouth in the morning and 1 capsule (100mg ) by mouth at bedtime.   Yes [provider]  DULoxetine (CYMBALTA) 60 MG capsule TAKE ONE CAPSULE BY MOUTH ONCE DAILY. 04/25/21  Yes Koberlein, Junell C, MD  famotidine (PEPCID) 20 MG tablet TAKE (1) TABLET BY MOUTH TWICE DAILY. Patient taking differently: Take 20 mg by mouth 2 (two) times daily. 03/09/21  Yes Koberlein, 06/25/21, MD  HYDROcodone-acetaminophen (NORCO/VICODIN) 5-325 MG tablet Take 1 tablet by mouth every 6 (six) hours as needed. 05/02/21  Yes Paris Lore, MD  irbesartan (AVAPRO) 150 MG tablet Take 1 tablet (150 mg total) by mouth daily. 02/01/21  Yes Koberlein, Bethann Berkshire, MD  levETIRAcetam (KEPPRA) 250 MG tablet Take 1 tablet (250 mg total) by mouth 2 (two) times daily. 04/19/21  Yes Paris Lore, MD  levothyroxine (SYNTHROID) 25 MCG tablet TAKE ONE TABLET BY MOUTH ONCE DAILY BEFORE BREAKFAST. 04/25/21  Yes Koberlein, Junell C, MD  MELATONIN PO Take 6 mg by mouth at bedtime.   Yes [provider]  meloxicam (MOBIC) 7.5 MG tablet Take 1-2 tablets (7.5-15 mg total) by mouth daily. 04/17/21  Yes Koberlein, 11-09-2004, MD  nitroGLYCERIN (NITROSTAT) 0.4 MG SL tablet Place 1 tablet (0.4 mg total) under the tongue every 5 (five) minutes as needed for chest pain. 07/02/20  Yes Paris Lore, MD  propranolol ER (INDERAL LA) 60 MG 24 hr capsule Take 1 capsule (60 mg total) by mouth at bedtime. 04/19/21  Yes Dione Booze, MD  QUEtiapine (SEROQUEL) 25 MG tablet Take 1 tablet (25 mg total) by mouth at bedtime. 06/17/20 07/13/20  06/19/20, MD    Allergies    Percocet [oxycodone-acetaminophen], Risperidone, Seroquel [quetiapine fumarate], and Simvastatin  Review of Systems   Review of Systems  Constitutional:  Negative for appetite change and fatigue.  HENT:  Negative for  congestion, ear discharge and sinus pressure.   Eyes:  Negative for discharge.  Respiratory:  Negative for cough.   Cardiovascular:  Negative for chest pain.  Gastrointestinal:  Negative for abdominal pain and diarrhea.  Genitourinary:  Negative for frequency and hematuria.  Musculoskeletal:  Negative for back pain.       Lumbar spine pain  Skin:  Negative for rash.  Neurological:  Negative for seizures and headaches.  Psychiatric/Behavioral:  Negative for hallucinations.    Physical Exam Updated Vital Signs BP (!) 169/72   Pulse 60   Temp 98.4 F (36.9 C) (Oral)   Resp 20   Ht 5\' 5"  (1.651 m)   Wt 71 kg   SpO2 97%   BMI 26.05 kg/m  Physical Exam Vitals and nursing note reviewed.  Constitutional:      Appearance: She is well-developed.  HENT:     Head: Normocephalic.     Nose: Nose normal.  Eyes:     General: No scleral icterus.    Conjunctiva/sclera: Conjunctivae normal.  Neck:     Thyroid: No thyromegaly.  Cardiovascular:     Rate and Rhythm: Normal rate and regular rhythm.     Heart sounds: No murmur heard.   No friction rub. No gallop.  Pulmonary:     Breath sounds: No stridor. No wheezing or rales.  Chest:     Chest wall: No tenderness.  Abdominal:     General: There is no distension.     Tenderness: There is no abdominal tenderness. There is no rebound.  Musculoskeletal:     Cervical back: Neck supple.     Comments: Tenderness to lumbar spine  Lymphadenopathy:     Cervical: No cervical adenopathy.  Skin:    Findings: No erythema or rash.  Neurological:     Mental Status: She is alert and oriented to person, place, and time.     Motor: No abnormal muscle tone.     Coordination: Coordination normal.     Comments: Normal strength and sensation in her legs.  No paresthesias in her buttocks.  Normal neurological exam.  Psychiatric:        Behavior: Behavior normal.    ED Results / Procedures / Treatments   Labs (all labs ordered are listed, but only  abnormal results are displayed) Labs Reviewed - No data to display  EKG None  Radiology DG Lumbar Spine Complete  Result Date: 05/02/2021 CLINICAL DATA:  Trauma, lower back pain EXAM: LUMBAR SPINE - COMPLETE 4+ VIEW COMPARISON:  CT lumbar spine 10/08/2020, lumbar spine radiographs 02/02/2004 FINDINGS: There is compression deformity of the L3 vertebral body with approximately 30% loss of vertebral body height anteriorly. Which is new since the prior CT from 10/08/2020. There is minimal bony retropulsion. The remaining vertebral body heights are preserved. Alignment is otherwise normal. The disc spaces are preserved. There is mild facet arthropathy in the lower lumbar spine. The soft tissues are unremarkable. IMPRESSION: Compression deformity of the L3 vertebral body, new since 10/08/2020 and suspected acute. Recommend CT lumbar spine for further characterization. Electronically Signed   By: Lesia Hausen M.D.   On: 05/02/2021 11:04   CT Lumbar Spine Wo Contrast  Result Date: 05/02/2021 CLINICAL DATA:  Low back pain, trauma EXAM: CT LUMBAR SPINE WITHOUT CONTRAST TECHNIQUE: Multidetector CT imaging of the lumbar spine was performed without intravenous contrast administration. Multiplanar CT image reconstructions were also generated. COMPARISON:  October 08, 2020. Lumbar radiographs from the same day. FINDINGS: Segmentation: 5 non rib-bearing lumbar vertebral bodies. Alignment: No substantial sagittal subluxation. Mild broad levocurvature. L3 bony retropulsion detailed below. Vertebrae: Acute L3 superior endplate fracture with approximately 45% height loss and 5-6 mm of bony retropulsion along the superior endplate. The other vertebral bodies maintain their height. Diffuse osteopenia. Paraspinal and other soft tissues: Aorto bi-iliac calcific atherosclerosis. Disc levels: Approximately 5-6 mm bony retropulsion along the superior L3 endplate. Resulting potentially mild-to-moderate right eccentric canal  stenosis. Mild for age degenerative change, including lower lumbar facet arthropathy. IMPRESSION: 1. Acute L3 superior endplate fracture with approximately 45% height loss and 5-6 mm of bony retropulsion along the superior endplate. Resulting potentially mild-to-moderate right eccentric canal stenosis. An MRI could better evaluate the canal if clinically indicated. 2. Osteopenia. 3. Aortic  Atherosclerosis (ICD10-I70.0). Electronically Signed   By: Feliberto Harts M.D.   On: 05/02/2021 12:59   DG Hip Unilat W or Wo Pelvis 2-3 Views Left  Result Date: 05/02/2021 CLINICAL DATA:  Trauma.  Lower back pain. EXAM: DG HIP (WITH OR WITHOUT PELVIS) 2-3V LEFT COMPARISON:  CT abdomen and pelvis 06/10/2017 FINDINGS: Pelvic bony ring is intact. Oval-shaped density in the left hemipelvis likely represents a calcification. Normal appearance of the right hip. The left hip is located without a fracture. IMPRESSION: No acute abnormalities of the pelvis or left hip. Electronically Signed   By: Richarda Overlie M.D.   On: 05/02/2021 11:06    Procedures Procedures   Medications Ordered in ED Medications  HYDROmorphone (DILAUDID) injection 0.5 mg (0.5 mg Intramuscular Given 05/02/21 1057)  LORazepam (ATIVAN) injection 1 mg (1 mg Intramuscular Given 05/02/21 1233)    ED Course  I have reviewed the triage vital signs and the nursing notes.  Pertinent labs & imaging results that were available during my care of the patient were reviewed by me and considered in my medical decision making (see chart for details).    MDM Rules/Calculators/A&P                           Patient has an L3 compression fracture.  She will be given a back brace and follow-up with neurosurgery Final Clinical Impression(s) / ED Diagnoses Final diagnoses:  Fall, initial encounter    Rx / DC Orders ED Discharge Orders          Ordered    HYDROcodone-acetaminophen (NORCO/VICODIN) 5-325 MG tablet  Every 6 hours PRN        05/02/21 1447              Bethann Berkshire, MD 05/02/21 1451

## 2021-05-02 NOTE — Progress Notes (Signed)
Orthopedic Tech Progress Note Patient Details:  Lori Bell September 30, 1945 [id number]  Called in order to [employer] for a LSO/CLAM SHELL Patient ID: [name], female   DOB: July 03, 1946, 75 y.o.   MRN: [id number]  [name] 05/02/2021, 3:20 PM

## 2021-05-02 NOTE — Discharge Instructions (Signed)
Take the hydrocodone if Tylenol alone does not help with the discomfort.  Follow-up with Dr. Conchita Paris or one of his associates in the next couple weeks.

## 2021-05-02 NOTE — ED Triage Notes (Signed)
Pt arrived via [facility]. Pt fell at home today. Pt has hx of dementia. Pt c/o of lower back pain.

## 2021-05-02 NOTE — ED Notes (Signed)
Scott applied back brace to pt.

## 2021-05-03 ENCOUNTER — Encounter: Payer: Self-pay | Admitting: Family Medicine

## 2021-05-03 ENCOUNTER — Other Ambulatory Visit (INDEPENDENT_AMBULATORY_CARE_PROVIDER_SITE_OTHER): Payer: PPO

## 2021-05-03 ENCOUNTER — Telehealth: Payer: Self-pay

## 2021-05-03 ENCOUNTER — Other Ambulatory Visit: Payer: Self-pay | Admitting: Family Medicine

## 2021-05-03 DIAGNOSIS — E039 Hypothyroidism, unspecified: Secondary | ICD-10-CM | POA: Diagnosis not present

## 2021-05-03 DIAGNOSIS — R5383 Other fatigue: Secondary | ICD-10-CM | POA: Diagnosis not present

## 2021-05-03 DIAGNOSIS — R195 Other fecal abnormalities: Secondary | ICD-10-CM

## 2021-05-03 DIAGNOSIS — E559 Vitamin D deficiency, unspecified: Secondary | ICD-10-CM | POA: Diagnosis not present

## 2021-05-03 DIAGNOSIS — E611 Iron deficiency: Secondary | ICD-10-CM | POA: Diagnosis not present

## 2021-05-03 DIAGNOSIS — E538 Deficiency of other specified B group vitamins: Secondary | ICD-10-CM

## 2021-05-03 LAB — CBC WITH DIFFERENTIAL/PLATELET
Basophils Absolute: 0.1 10*3/uL (ref 0.0–0.1)
Basophils Relative: 0.9 % (ref 0.0–3.0)
Eosinophils Absolute: 0.4 10*3/uL (ref 0.0–0.7)
Eosinophils Relative: 6.7 % — ABNORMAL HIGH (ref 0.0–5.0)
HCT: 37 % (ref 36.0–46.0)
Hemoglobin: 12.4 g/dL (ref 12.0–15.0)
Lymphocytes Relative: 26.2 % (ref 12.0–46.0)
Lymphs Abs: 1.7 10*3/uL (ref 0.7–4.0)
MCHC: 33.4 g/dL (ref 30.0–36.0)
MCV: 91.4 fl (ref 78.0–100.0)
Monocytes Absolute: 0.5 10*3/uL (ref 0.1–1.0)
Monocytes Relative: 7.1 % (ref 3.0–12.0)
Neutro Abs: 3.9 10*3/uL (ref 1.4–7.7)
Neutrophils Relative %: 59.1 % (ref 43.0–77.0)
Platelets: 210 10*3/uL (ref 150.0–400.0)
RBC: 4.05 Mil/uL (ref 3.87–5.11)
RDW: 13.9 % (ref 11.5–15.5)
WBC: 6.7 10*3/uL (ref 4.0–10.5)

## 2021-05-03 LAB — COMPREHENSIVE METABOLIC PANEL
ALT: 17 U/L (ref 0–35)
AST: 15 U/L (ref 0–37)
Albumin: 3.8 g/dL (ref 3.5–5.2)
Alkaline Phosphatase: 95 U/L (ref 39–117)
BUN: 15 mg/dL (ref 6–23)
CO2: 24 mEq/L (ref 19–32)
Calcium: 9.4 mg/dL (ref 8.4–10.5)
Chloride: 105 mEq/L (ref 96–112)
Creatinine, Ser: 1.06 mg/dL (ref 0.40–1.20)
GFR: 50.96 mL/min — ABNORMAL LOW (ref >60.00)
Glucose, Bld: 110 mg/dL — ABNORMAL HIGH (ref 70–99)
Potassium: 3.3 mEq/L — ABNORMAL LOW (ref 3.5–5.1)
Sodium: 140 mEq/L (ref 135–145)
Total Bilirubin: 0.9 mg/dL (ref 0.2–1.2)
Total Protein: 7.6 g/dL (ref 6.0–8.3)

## 2021-05-03 LAB — VITAMIN B12: Vitamin B-12: 135 pg/mL — ABNORMAL LOW (ref 211–911)

## 2021-05-03 LAB — FOLATE: Folate: 9 ng/mL (ref >5.9)

## 2021-05-03 LAB — IBC + FERRITIN
Ferritin: 39 ng/mL (ref 10.0–291.0)
Iron: 51 ug/dL (ref 42–145)
Saturation Ratios: 16.3 % — ABNORMAL LOW (ref 20.0–50.0)
TIBC: 312.2 ug/dL (ref 250.0–450.0)
Transferrin: 223 mg/dL (ref 212.0–360.0)

## 2021-05-03 LAB — TSH: TSH: 1.61 u[IU]/mL (ref 0.35–5.50)

## 2021-05-03 LAB — VITAMIN D 25 HYDROXY (VIT D DEFICIENCY, FRACTURES): VITD: 37.43 ng/mL (ref 30.00–100.00)

## 2021-05-03 MED ORDER — CALCITONIN (SALMON) 200 UNIT/ACT NA SOLN: 1.0000 | Freq: Every day | NASAL | 2 refills | Status: AC

## 2021-05-03 NOTE — Telephone Encounter (Signed)
Spoke with Misty Stanley and informed her of the approval for orders as below.

## 2021-05-03 NOTE — Telephone Encounter (Signed)
Ok for this? 

## 2021-05-03 NOTE — Telephone Encounter (Signed)
Kennyth Arnold from [facility] called to report patient sustained compression fracture to the back and is requesting an increase of physical therapy in the home. Call back # [phone]

## 2021-05-04 ENCOUNTER — Other Ambulatory Visit: Payer: Self-pay | Admitting: Family Medicine

## 2021-05-04 ENCOUNTER — Other Ambulatory Visit: Payer: PPO | Admitting: Nurse Practitioner

## 2021-05-04 ENCOUNTER — Other Ambulatory Visit: Payer: Self-pay

## 2021-05-04 DIAGNOSIS — R195 Other fecal abnormalities: Secondary | ICD-10-CM

## 2021-05-04 NOTE — Progress Notes (Unsigned)
Fob  

## 2021-05-08 ENCOUNTER — Telehealth: Payer: Self-pay | Admitting: *Deleted

## 2021-05-08 NOTE — Telephone Encounter (Signed)
Prior auth for Calcitonin 200units/act solution sent to [email or web address].

## 2021-05-09 ENCOUNTER — Telehealth: Payer: Self-pay | Admitting: Family Medicine

## 2021-05-09 NOTE — Telephone Encounter (Signed)
[name] Reaves with Hospice called stating that she received a call from patient daughter [name]. She is requesting the last physical, demographics, and last office visit progress notes to be sent to this fax number [phone].  [name] Reaves also wants to know if Dr.Koberlein would be the attending following along with hospice doctors.  [name] Reaves could be contacted at [phone] with any questions.  Please advise

## 2021-05-09 NOTE — Telephone Encounter (Signed)
Left a message with the receptionist LaToya to inform [name] our office does not send records without a signed release from the patient and the message will be forwarded to PCP to see if she will be the attending physician.  Message sent to PCP.

## 2021-05-10 DIAGNOSIS — S32031A Stable burst fracture of third lumbar vertebra, initial encounter for closed fracture: Secondary | ICD-10-CM | POA: Diagnosis not present

## 2021-05-10 DIAGNOSIS — Z6826 Body mass index (BMI) 26.0-26.9, adult: Secondary | ICD-10-CM | POA: Diagnosis not present

## 2021-05-10 DIAGNOSIS — I10 Essential (primary) hypertension: Secondary | ICD-10-CM | POA: Diagnosis not present

## 2021-05-10 NOTE — Telephone Encounter (Signed)
Spoke with [name] and informed her of the message below.  [name] stated she needs an actual order for Hospice as the patients daughter called to request the referral.  [name] asked a co-worker and stated since PCP agreed to be the attending physician, this will qualify as order.  I also advised [name] I thought [facility] was able to view records via Epic.  [name] stated she is able to see the office notes from 8/29 which is the last visit with PCP.

## 2021-05-10 NOTE — Telephone Encounter (Signed)
*  I am fine with being attending physician *tammy is patients POA and can sign for her if needed, but I would view this if they are taking her on board with hospice as a referral in which case we would forward records along with that referral. Whatever is most seamless for them would be ideal.

## 2021-05-11 NOTE — Telephone Encounter (Signed)
Fax received from [facility] stating the request was approved through 05/08/2121.  Spoke with Blanchie Dessert at [facility] and informed her of this.  Copy sent to be scanned.

## 2021-05-12 ENCOUNTER — Encounter: Payer: Self-pay | Admitting: Family Medicine

## 2021-05-18 ENCOUNTER — Other Ambulatory Visit: Payer: Self-pay | Admitting: Family Medicine

## 2021-05-22 ENCOUNTER — Other Ambulatory Visit: Payer: PPO | Admitting: Nurse Practitioner

## 2021-05-24 ENCOUNTER — Other Ambulatory Visit: Payer: Self-pay | Admitting: Family Medicine

## 2021-05-24 ENCOUNTER — Telehealth: Payer: Self-pay | Admitting: Family Medicine

## 2021-05-24 MED ORDER — HYDROCODONE-ACETAMINOPHEN 5-325 MG PO TABS: 1.0000 | ORAL_TABLET | Freq: Four times a day (QID) | ORAL | 0 refills | Status: AC | PRN

## 2021-05-24 NOTE — Telephone Encounter (Signed)
Patient need Hydrocodone 5/325 mg take 1 every 6 hours as needed.  Patient got this in the hospital and needs a new refill for this.  Pt has 1 pill left.   Pharmacy--[facility] in [place]

## 2021-05-24 NOTE — Telephone Encounter (Signed)
done

## 2021-05-29 ENCOUNTER — Telehealth: Payer: Self-pay

## 2021-05-29 ENCOUNTER — Other Ambulatory Visit: Payer: Self-pay | Admitting: Family Medicine

## 2021-05-29 MED ORDER — IPRATROPIUM BROMIDE 0.06 % NA SOLN: 2.0000 | Freq: Four times a day (QID) | NASAL | 12 refills | Status: AC

## 2021-05-29 NOTE — Telephone Encounter (Signed)
Lori Bell - can you please let Lori Bell know I did send in atrovent nasal spray. Looks like her regular insurance will cover completely if they don't cover, but otherwise we can do flonase instead. Continue with mucinex.   (I spoke with Lori Bell - cough started Thursday; got worse into night. Tried robitussin without relief. Some benefit with musicnex. Increased work of breathing Sunday. Seems better today. Sunday she was laying down most of day. Today sitting up more. She has been eating other things like pureed soup, pureed chicken pot pie and seems to enjoy these things, but likes baby food fruits the most. She does tend to have allergy issues in spring and fall. Has been on flonase in the past.   Call if any worsening of sx.   I spoke with Lori Bell as well. O2 sat 90%. States that although there were some diminished lung sounds, there was no wheezing, no rales. Temp normal. No lower extremity edema. )

## 2021-05-29 NOTE — Telephone Encounter (Signed)
Spoke with Renee and informed her of the message below.

## 2021-05-29 NOTE — Telephone Encounter (Signed)
Spoke with Luster Landsberg and she stated she was at the patient's home this morning.  Patient complains of chest congestion and a non-productive cough x2 days. States the patient's daughter tried Robitussin with no relief, Mucinex 600mg  with some relief, denies a fever or other symptoms and told her the patient usually has these same symptoms during fall.  Also stated the patient is only eating baby food at this time and message sent to PCP for recommendations.

## 2021-05-29 NOTE — Telephone Encounter (Signed)
Lori Bell from [facility] is requesting a call back [phone]

## 2021-05-31 ENCOUNTER — Encounter: Payer: Self-pay | Admitting: Family Medicine

## 2021-05-31 MED ORDER — DOXYCYCLINE HYCLATE 100 MG PO TABS: 100.0000 mg | ORAL_TABLET | Freq: Two times a day (BID) | ORAL | 0 refills | Status: AC

## 2021-05-31 MED ORDER — FUROSEMIDE 20 MG PO TABS: 20.0000 mg | ORAL_TABLET | Freq: Every day | ORAL | 0 refills | Status: AC

## 2021-05-31 NOTE — Telephone Encounter (Signed)
Spoke with Renee and informed her of the orders as below. Renee stated they do not perform Covid tests on patients unless they are going to [facility].  She is aware the Rxs were sent to [facility] and agreed to contact the patients daughter.

## 2021-05-31 NOTE — Telephone Encounter (Signed)
*  ok to restart lasix 20mg  daily.  *if able; would suggest testing for flu/covid. Ok to start doxycycline 100mg  PO BID x 7 days to cover for respiratory infection. Make sure saturating above 90% on room air. Should have standing hospice order for oxygen for comfort as needed. Let me know if not improving with this.

## 2021-05-31 NOTE — Telephone Encounter (Signed)
Spoke with Luster Landsberg and she stated she saw the patient this morning.  Renee noted crackles all lung lobes, patient is afebrile, only eating bites of food, is lethargic, has a non-productive cough and no edema.  Renee questioned if PCP wanted the patient to begin taking Lasix again and/or if an antibiotic could be given due to lungs sounding wet?  Message sent to PCP.

## 2021-05-31 NOTE — Telephone Encounter (Signed)
Lori Bell is requesting a phone call from Lori Bell regarding patient.  Lori Bell could be contacted at [phone].  Please advise

## 2021-05-31 NOTE — Addendum Note (Signed)
Addended by: Johnella Moloney on: 05/31/2021 05:04 PM   Modules accepted: Orders

## 2021-06-19 ENCOUNTER — Encounter: Payer: Self-pay | Admitting: Family Medicine

## 2021-06-19 MED ORDER — CIPROFLOXACIN-DEXAMETHASONE 0.3-0.1 % OT SUSP
4.0000 [drp] | Freq: Two times a day (BID) | OTIC | 0 refills | Status: AC
Start: 2021-06-19 — End: ?

## 2021-07-10 ENCOUNTER — Encounter: Payer: Self-pay | Admitting: Family Medicine

## 2021-07-10 ENCOUNTER — Other Ambulatory Visit: Payer: Self-pay

## 2021-07-10 NOTE — Patient Outreach (Signed)
[facility]) Care Management  07/10/2021  Lori Bell July 03, 1946 [id number]   No telephone outreach to patient to obtain mRS, note in chart informed that patient is deceased. MRS= 6   Lori Bell [facility] Care Management Assistant

## 2021-07-11 ENCOUNTER — Telehealth: Payer: Self-pay | Admitting: Family Medicine

## 2021-07-11 ENCOUNTER — Telehealth: Payer: Self-pay | Admitting: *Deleted

## 2021-07-11 NOTE — Telephone Encounter (Signed)
-----   Message from Wynn Banker, MD sent at 07/10/2021  8:19 PM EST ----- Daughter sent me message that Lori Bell passed in hospice care this weekend. Please update her chart. Thanks!

## 2021-07-11 NOTE — Telephone Encounter (Signed)
Status changed with Brittney's assistance.

## 2021-07-11 NOTE — Telephone Encounter (Signed)
Lupita Leash from [facility] called to let office know that there is a death certificate needing to be signed by Dr.Koberlein in [place]. I let her know that Dr.Koberlein is out today but will be back in tomorrow.

## 2021-07-12 NOTE — Telephone Encounter (Signed)
Spoke with Lupita Leash and informed her of the message below.

## 2021-07-12 NOTE — Telephone Encounter (Signed)
Completed paperwork in [facility] system.

## 2021-07-20 DEATH — deceased

## 2021-07-24 ENCOUNTER — Ambulatory Visit: Payer: PPO | Admitting: Family Medicine

## 2022-02-17 IMAGING — CT CT HEAD W/O CM
4 of 5 series · 15 of 47 positions shown, 17 images · non-contrast
Comparison: None.

CLINICAL DATA: Stroke follow-up

EXAM:
CT HEAD WITHOUT CONTRAST
TECHNIQUE: Contiguous axial images were obtained from the base of the skull
through the vertex without intravenous contrast.

[Series 3: head without · axial · non-contrast · 0.44mm/px · z∈[+378,+468]mm · 4 of 32 slices shown (1 of 2)]
[im 7/32  brain]
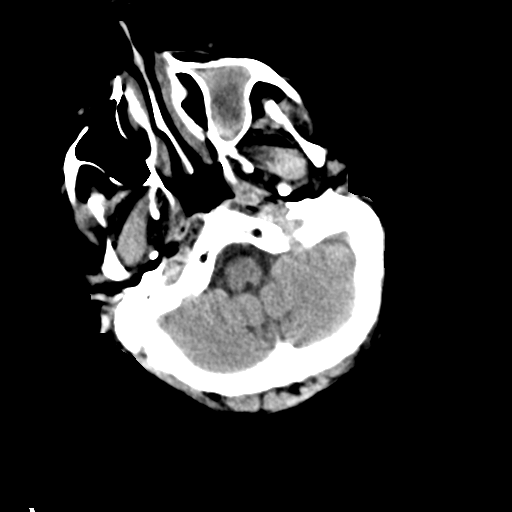
[im 13/32  brain]
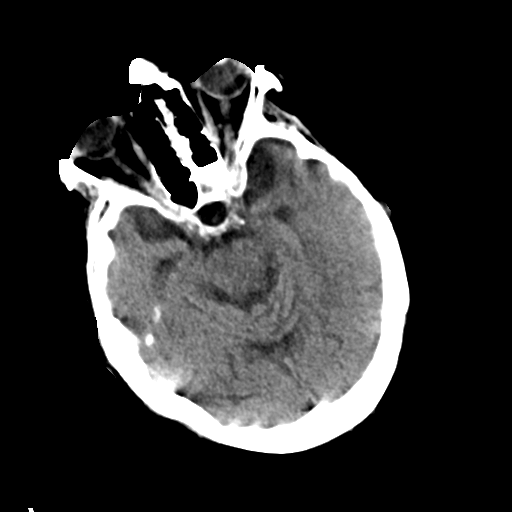
[im 19/32  brain]
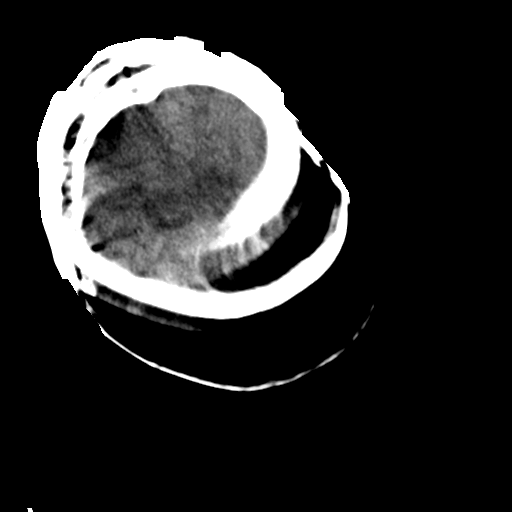
[im 25/32  brain]
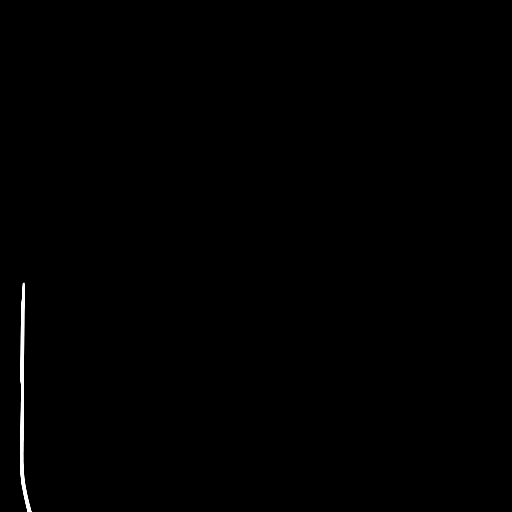

[Series 4: head without · axial · non-contrast · 0.44mm/px · z∈[+373,+478]mm · 5 of 33 slices shown, 7 images (2 of 2)]
[im 6/33  brain]
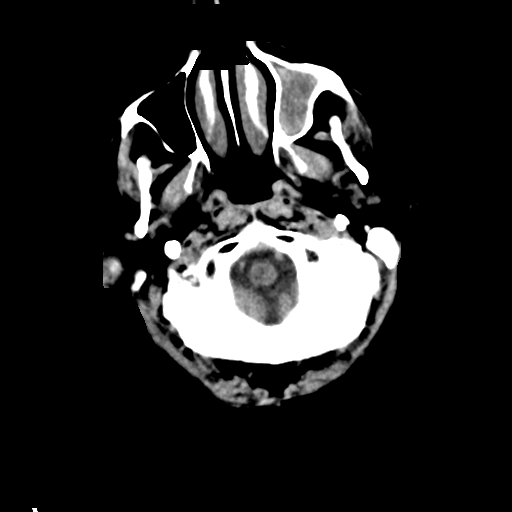
[im 6/33  bone]
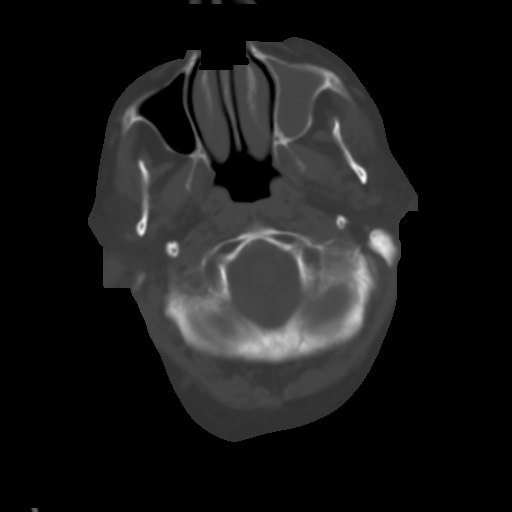
[im 11/33  brain]
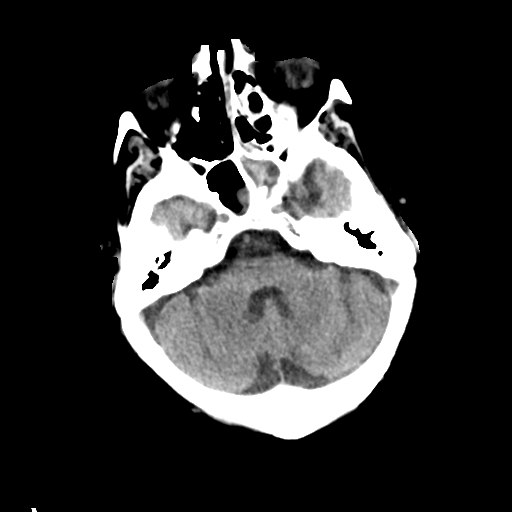
[im 17/33  brain]
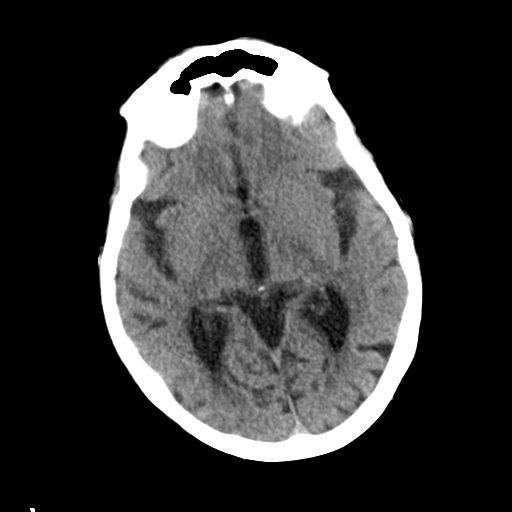
[im 22/33  brain]
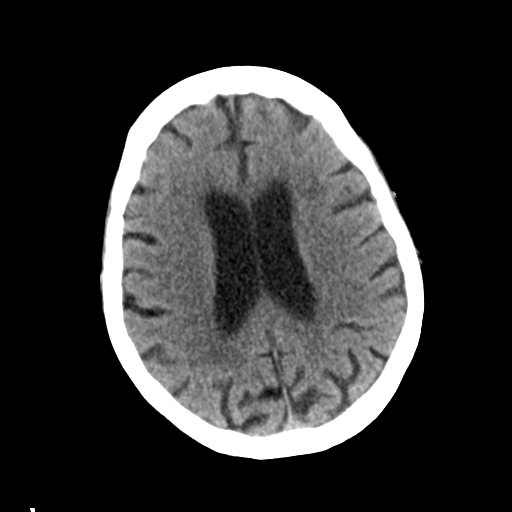
[im 27/33  brain]
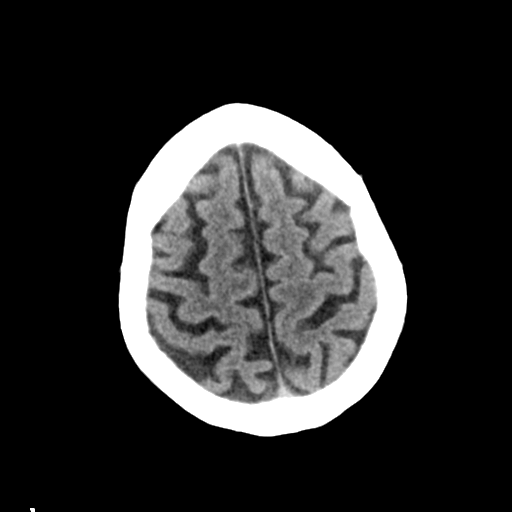
[im 27/33  bone]
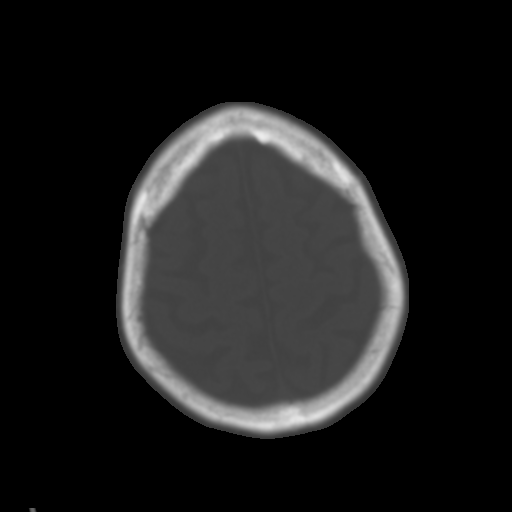

[Series 6: head without cor · coronal · non-contrast · 0.37mm/px · 3 of 70 slices shown]
[im 26/70  brain]
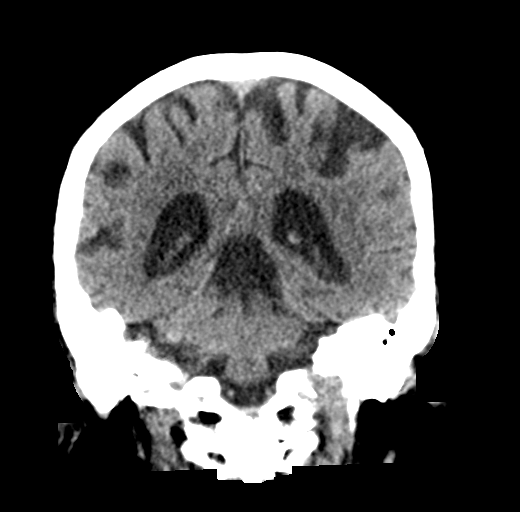
[im 32/70  brain]
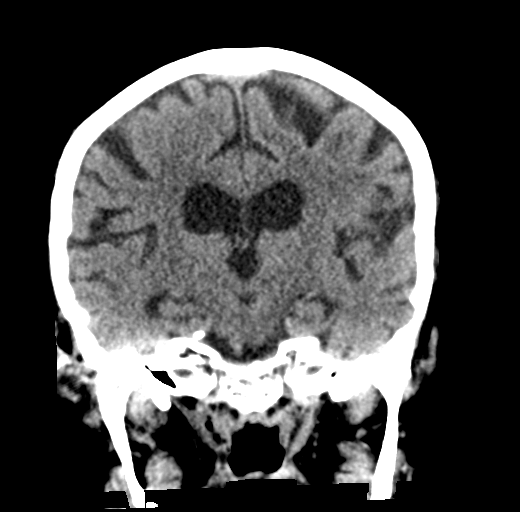
[im 38/70  brain]
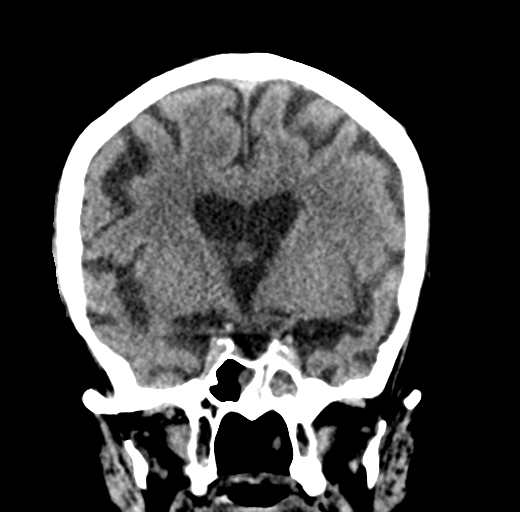

[Series 7: head without sag · sagittal · non-contrast · 0.32mm/px · 3 of 66 slices shown]
[im 22/66  brain]
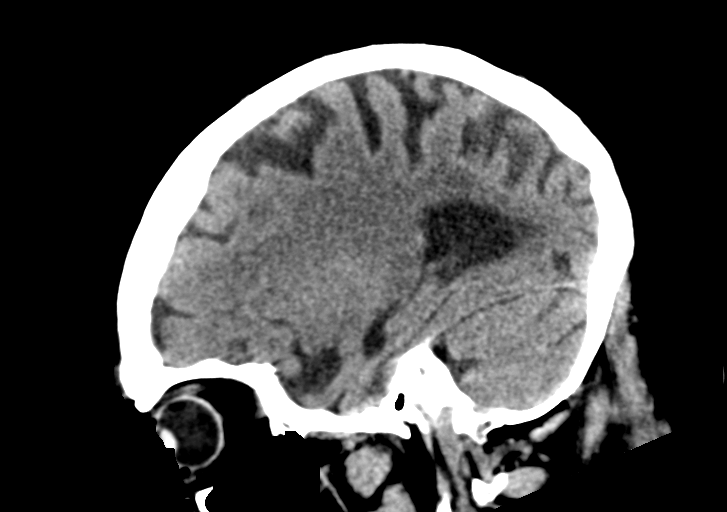
[im 33/66  brain]
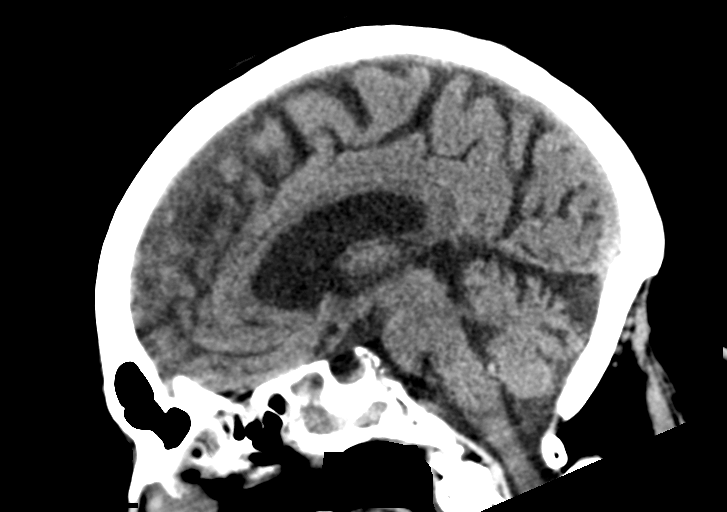
[im 44/66  brain]
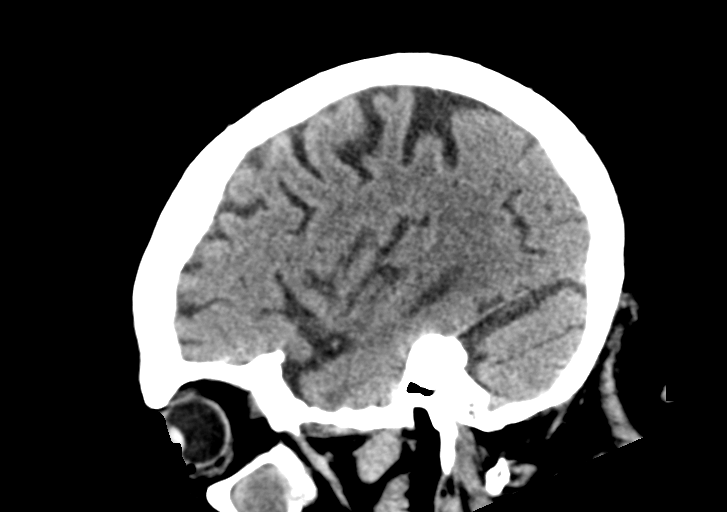

[15 of 47 positions shown; findings below may reference images not displayed]

FINDINGS: Brain: There is no mass, hemorrhage or extra-axial collection. The
size and configuration of the ventricles and extra-axial CSF spaces
are normal. There is hypoattenuation of the white matter, most
commonly indicating chronic small vessel disease.

Vascular: No abnormal hyperdensity of the major intracranial
arteries or dural venous sinuses. No intracranial atherosclerosis.

Skull: The visualized skull base, calvarium and extracranial soft
tissues are normal.

Sinuses/Orbits: Left maxillary and sphenoid sinus opacification. No
fluid levels. Left mastoid effusion. Nasopharynx is clear. The
orbits are normal.
IMPRESSION: 1. Chronic small vessel disease without acute intracranial
abnormality.
2. Left maxillary and sphenoid sinus disease and left mastoid
effusion.
